# Patient Record
Sex: Female | Born: 1960 | Race: White | Hispanic: No | Marital: Single | Smoking: Former smoker
Health system: Southern US, Community
[De-identification: ages and names within clinical notes are randomized; demographics above are authoritative.]

## PROBLEM LIST (undated history)

## (undated) DIAGNOSIS — K219 Gastro-esophageal reflux disease without esophagitis: Secondary | ICD-10-CM

## (undated) DIAGNOSIS — E7800 Pure hypercholesterolemia, unspecified: Secondary | ICD-10-CM

## (undated) DIAGNOSIS — Z9889 Other specified postprocedural states: Secondary | ICD-10-CM

## (undated) DIAGNOSIS — T7840XA Allergy, unspecified, initial encounter: Secondary | ICD-10-CM

## (undated) DIAGNOSIS — F419 Anxiety disorder, unspecified: Secondary | ICD-10-CM

## (undated) DIAGNOSIS — N6019 Diffuse cystic mastopathy of unspecified breast: Secondary | ICD-10-CM

## (undated) DIAGNOSIS — R112 Nausea with vomiting, unspecified: Secondary | ICD-10-CM

## (undated) HISTORY — DX: Anxiety disorder, unspecified: F41.9

## (undated) HISTORY — DX: Allergy, unspecified, initial encounter: T78.40XA

## (undated) HISTORY — PX: OTHER SURGICAL HISTORY: SHX169

## (undated) HISTORY — DX: Pure hypercholesterolemia, unspecified: E78.00

## (undated) HISTORY — PX: TONSILLECTOMY: SUR1361

## (undated) HISTORY — PX: BREAST BIOPSY: SHX20

## (undated) HISTORY — DX: Diffuse cystic mastopathy of unspecified breast: N60.19

---

## 2001-03-18 HISTORY — PX: ABDOMINAL HYSTERECTOMY: SHX81

## 2004-05-28 ENCOUNTER — Emergency Department: Payer: Self-pay | Admitting: Emergency Medicine

## 2005-12-05 ENCOUNTER — Emergency Department: Payer: Self-pay

## 2006-01-14 DIAGNOSIS — E785 Hyperlipidemia, unspecified: Secondary | ICD-10-CM | POA: Insufficient documentation

## 2006-01-14 DIAGNOSIS — F411 Generalized anxiety disorder: Secondary | ICD-10-CM | POA: Insufficient documentation

## 2008-03-30 ENCOUNTER — Ambulatory Visit: Payer: Self-pay

## 2009-04-04 ENCOUNTER — Ambulatory Visit: Payer: Self-pay

## 2009-04-12 ENCOUNTER — Ambulatory Visit: Payer: Self-pay

## 2009-06-20 ENCOUNTER — Ambulatory Visit: Payer: Self-pay

## 2009-07-17 HISTORY — PX: BREAST BIOPSY: SHX20

## 2010-01-29 ENCOUNTER — Ambulatory Visit: Payer: Self-pay

## 2011-02-20 ENCOUNTER — Ambulatory Visit: Payer: Self-pay

## 2011-10-22 ENCOUNTER — Ambulatory Visit: Payer: Self-pay | Admitting: General Surgery

## 2012-04-10 ENCOUNTER — Ambulatory Visit: Payer: Self-pay | Admitting: Family Medicine

## 2012-04-11 ENCOUNTER — Ambulatory Visit: Payer: Self-pay | Admitting: Family Medicine

## 2012-04-11 ENCOUNTER — Other Ambulatory Visit: Payer: Self-pay | Admitting: Family Medicine

## 2012-04-11 LAB — CBC WITH DIFFERENTIAL/PLATELET
Basophil #: 0.1 10*3/uL (ref 0.0–0.1)
Basophil %: 1.2 %
Eosinophil #: 0.5 10*3/uL (ref 0.0–0.7)
Eosinophil %: 5.1 %
HCT: 37.4 % (ref 35.0–47.0)
HGB: 12.8 g/dL (ref 12.0–16.0)
Lymphocyte %: 33.4 %
MCH: 30.3 pg (ref 26.0–34.0)
Neutrophil #: 5 10*3/uL (ref 1.4–6.5)
RDW: 13.2 % (ref 11.5–14.5)

## 2012-04-11 LAB — COMPREHENSIVE METABOLIC PANEL
Albumin: 3.7 g/dL (ref 3.4–5.0)
Anion Gap: 6 — ABNORMAL LOW (ref 7–16)
BUN: 18 mg/dL (ref 7–18)
Bilirubin,Total: 0.3 mg/dL (ref 0.2–1.0)
Calcium, Total: 8.7 mg/dL (ref 8.5–10.1)
Co2: 25 mmol/L (ref 21–32)
Creatinine: 0.61 mg/dL (ref 0.60–1.30)
EGFR (African American): >60
SGOT(AST): 32 U/L (ref 15–37)
SGPT (ALT): 49 U/L (ref 12–78)
Sodium: 138 mmol/L (ref 136–145)

## 2012-05-27 ENCOUNTER — Encounter: Payer: Self-pay | Admitting: *Deleted

## 2012-06-02 ENCOUNTER — Ambulatory Visit: Payer: Self-pay | Admitting: General Surgery

## 2012-06-22 ENCOUNTER — Ambulatory Visit: Payer: Self-pay | Admitting: General Surgery

## 2012-07-15 ENCOUNTER — Encounter: Payer: Self-pay | Admitting: *Deleted

## 2012-10-27 ENCOUNTER — Ambulatory Visit: Payer: Self-pay | Admitting: Obstetrics and Gynecology

## 2012-12-02 ENCOUNTER — Encounter: Payer: Self-pay | Admitting: *Deleted

## 2012-12-23 ENCOUNTER — Ambulatory Visit: Payer: PRIVATE HEALTH INSURANCE | Admitting: General Surgery

## 2013-01-12 ENCOUNTER — Ambulatory Visit: Payer: Self-pay | Admitting: Orthopedic Surgery

## 2013-01-25 ENCOUNTER — Emergency Department: Payer: Self-pay | Admitting: Emergency Medicine

## 2013-02-02 ENCOUNTER — Encounter: Payer: Self-pay | Admitting: *Deleted

## 2013-11-01 ENCOUNTER — Ambulatory Visit: Payer: Self-pay | Admitting: General Surgery

## 2013-11-01 LAB — HM MAMMOGRAPHY

## 2013-11-02 ENCOUNTER — Encounter: Payer: Self-pay | Admitting: General Surgery

## 2014-01-17 ENCOUNTER — Encounter: Payer: Self-pay | Admitting: *Deleted

## 2014-08-23 ENCOUNTER — Telehealth: Payer: Self-pay | Admitting: Family Medicine

## 2014-08-23 DIAGNOSIS — K219 Gastro-esophageal reflux disease without esophagitis: Secondary | ICD-10-CM

## 2014-08-23 MED ORDER — LANSOPRAZOLE 30 MG PO CPDR: 30.0000 mg | DELAYED_RELEASE_CAPSULE | Freq: Every day | ORAL | Status: DC

## 2014-08-23 NOTE — Telephone Encounter (Signed)
Pt. Called requesting refill on Lansoprazole 30 mg.  Please call into [facility]. Pt call back # is [phone] cbe

## 2014-08-26 ENCOUNTER — Other Ambulatory Visit: Payer: Self-pay

## 2014-08-26 DIAGNOSIS — F419 Anxiety disorder, unspecified: Secondary | ICD-10-CM | POA: Insufficient documentation

## 2014-08-26 MED ORDER — CITALOPRAM HYDROBROMIDE 40 MG PO TABS: 40.0000 mg | ORAL_TABLET | Freq: Every day | ORAL | Status: DC

## 2014-09-05 ENCOUNTER — Other Ambulatory Visit: Payer: Self-pay | Admitting: Family Medicine

## 2014-09-05 MED ORDER — ALPRAZOLAM 0.5 MG PO TABS: ORAL_TABLET | ORAL | Status: DC

## 2014-09-05 NOTE — Telephone Encounter (Signed)
Dr. Elease Hashimoto patient.   She needs refills on her Xanax

## 2014-09-05 NOTE — Telephone Encounter (Signed)
Rx phoned into pharmacy.

## 2014-09-05 NOTE — Telephone Encounter (Signed)
Please call in the following medication.  Alprazolam 0.5mg  one tablet every eight hours as needed, #60, rf x 0.

## 2014-09-13 ENCOUNTER — Telehealth: Payer: Self-pay | Admitting: Family Medicine

## 2014-09-13 MED ORDER — HYDROCODONE-ACETAMINOPHEN 5-325 MG PO TABS: 1.0000 | ORAL_TABLET | Freq: Four times a day (QID) | ORAL | Status: DC | PRN

## 2014-09-13 NOTE — Telephone Encounter (Signed)
Patient needs written  Norco rx  5-325 mg.

## 2014-09-13 NOTE — Telephone Encounter (Signed)
LMTCB

## 2014-09-13 NOTE — Telephone Encounter (Signed)
Prescription printed. Please notify patient it is ready for pick up. Thanks- Dr. Braydin Aloi.  

## 2014-10-19 ENCOUNTER — Telehealth: Payer: Self-pay | Admitting: Family Medicine

## 2014-10-19 MED ORDER — HYDROCODONE-ACETAMINOPHEN 5-325 MG PO TABS: 1.0000 | ORAL_TABLET | Freq: Four times a day (QID) | ORAL | Status: DC | PRN

## 2014-10-19 NOTE — Telephone Encounter (Signed)
Printed rx for hydrocodone. Do not see we have given her Flagyl recently. What is it for.  Thanks.

## 2014-10-19 NOTE — Telephone Encounter (Signed)
Patient needs refill on Flaygl called to [facility] and also a written rx for the Norco.

## 2014-10-20 MED ORDER — METRONIDAZOLE 500 MG PO TABS: 500.0000 mg | ORAL_TABLET | Freq: Two times a day (BID) | ORAL | Status: DC

## 2014-10-20 NOTE — Telephone Encounter (Signed)
Pt advised.  She says she has bacterial vaginosis again and you prescribed that to her last year.  She says that cleared it up.    Thanks,   -Vernona Rieger

## 2014-11-02 ENCOUNTER — Ambulatory Visit
Admission: RE | Admit: 2014-11-02 | Discharge: 2014-11-02 | Disposition: A | Discharge status: Home / Self Care | Payer: Self-pay | Source: Ambulatory Visit | Attending: Oncology | Admitting: Oncology

## 2014-11-02 ENCOUNTER — Ambulatory Visit: Payer: Self-pay | Attending: Oncology | Admitting: *Deleted

## 2014-11-02 ENCOUNTER — Encounter: Payer: Self-pay | Admitting: *Deleted

## 2014-11-02 ENCOUNTER — Other Ambulatory Visit: Payer: Self-pay | Admitting: Oncology

## 2014-11-02 ENCOUNTER — Encounter (INDEPENDENT_AMBULATORY_CARE_PROVIDER_SITE_OTHER): Payer: Self-pay

## 2014-11-02 VITALS — BP 120/79 | HR 69 | Temp 97.2°F | Resp 17 | Ht 69.29 in | Wt 240.9 lb

## 2014-11-02 DIAGNOSIS — N644 Mastodynia: Secondary | ICD-10-CM | POA: Insufficient documentation

## 2014-11-02 DIAGNOSIS — N6001 Solitary cyst of right breast: Secondary | ICD-10-CM | POA: Insufficient documentation

## 2014-11-02 NOTE — Progress Notes (Signed)
Subjective:     Patient ID: Kristin Aguirre, female   DOB: 1960-06-28, 54 y.o.   MRN: [id number]  HPI   Review of Systems     Objective:   Physical Exam  Pulmonary/Chest: Right breast exhibits no inverted nipple, no mass, no nipple discharge, no skin change and no tenderness. Left breast exhibits tenderness. Left breast exhibits no inverted nipple, no mass, no nipple discharge and no skin change. Breasts are symmetrical.         Assessment:     54 year old White female presents to [facility] with complaints of left breast pain times one month.  Describes as intermittent, dull, and occasionally stabbing.  No aggravating or alleviating factors.  States its the same area she has had pain in the past when Dr. Lemar Livings aspirated a cyst. Patient does state she drinks about 2 soft drinks a day.   Family history includes a maternal grandmother with breast cancer.  Patient has been screened for eligibility.  She does not have any insurance, Medicare or Medicaid.  She also meets financial eligibility.  Hand-out given on the Affordable Care Act.    Plan:     Bilateral diagnostic mammogram and ultrasound ordered for targeted pain.  Will follow up per protocol.

## 2014-11-02 NOTE — Patient Instructions (Signed)
Gave patient hand-out, Women Staying Healthy, Active and Well from [facility], with education on breast health, pap smears, heart and colon health. 

## 2014-11-02 NOTE — Progress Notes (Signed)
Talked to patient this afternoon and review results of her mammogram.  Encouraged to call if her breast pain worsens or she feels a lump.  We can send her for further evaluation at that time.  She is agreeable. She is to follow up in one year. [facility]IS to [place].

## 2014-11-18 ENCOUNTER — Other Ambulatory Visit: Payer: Self-pay | Admitting: Family Medicine

## 2014-11-18 NOTE — Telephone Encounter (Signed)
Patient needs Estradiol and Xanax please.  [facility] [address]

## 2014-11-23 MED ORDER — ALPRAZOLAM 0.5 MG PO TABS: ORAL_TABLET | ORAL | Status: DC

## 2014-11-23 MED ORDER — ESTRADIOL 0.5 MG PO TABS: 0.5000 mg | ORAL_TABLET | Freq: Every day | ORAL | Status: DC

## 2014-11-23 NOTE — Telephone Encounter (Signed)
PLease call in rx for Xanax.  Thanks.

## 2014-11-23 NOTE — Telephone Encounter (Signed)
Patient actually called for this on Sept.2 and I forgot to route this to you.  Please do ASAP.  Thanks, Okey Regal

## 2014-11-23 NOTE — Addendum Note (Signed)
Addended by: Leo Grosser on: 11/23/2014 09:57 AM   Modules accepted: Orders

## 2014-11-23 NOTE — Telephone Encounter (Signed)
RX called in.   Thanks,   -Zephyr Sausedo  

## 2014-11-29 ENCOUNTER — Other Ambulatory Visit: Payer: Self-pay | Admitting: Family Medicine

## 2014-11-29 DIAGNOSIS — M545 Low back pain, unspecified: Secondary | ICD-10-CM | POA: Insufficient documentation

## 2014-11-29 MED ORDER — HYDROCODONE-ACETAMINOPHEN 5-325 MG PO TABS: 1.0000 | ORAL_TABLET | Freq: Four times a day (QID) | ORAL | Status: DC | PRN

## 2014-11-29 NOTE — Telephone Encounter (Signed)
Patient needs written rx for Norco.  Call when ready.

## 2014-11-29 NOTE — Telephone Encounter (Signed)
Prescription printed. Please notify patient it is ready for pick up. Thanks- Dr. Aidee Latimore.  

## 2014-11-29 NOTE — Telephone Encounter (Signed)
Left message advising her RX is up at the front desk.   Thanks,   -Vernona Rieger

## 2015-01-11 ENCOUNTER — Other Ambulatory Visit: Payer: Self-pay

## 2015-01-11 DIAGNOSIS — M545 Low back pain: Secondary | ICD-10-CM

## 2015-01-11 MED ORDER — HYDROCODONE-ACETAMINOPHEN 5-325 MG PO TABS: 1.0000 | ORAL_TABLET | Freq: Four times a day (QID) | ORAL | Status: DC | PRN

## 2015-01-11 NOTE — Telephone Encounter (Signed)
Printed, please fax or call in to pharmacy. Thank you.   

## 2015-01-11 NOTE — Telephone Encounter (Signed)
Pt advised to pick up prescription. sd

## 2015-01-11 NOTE — Telephone Encounter (Signed)
Pt called requesting a refill-aa

## 2015-01-16 ENCOUNTER — Other Ambulatory Visit: Payer: Self-pay | Admitting: Family Medicine

## 2015-01-16 DIAGNOSIS — F419 Anxiety disorder, unspecified: Secondary | ICD-10-CM

## 2015-01-16 MED ORDER — ALPRAZOLAM 0.5 MG PO TABS: ORAL_TABLET | ORAL | Status: DC

## 2015-01-16 NOTE — Telephone Encounter (Signed)
Pt needs refill ALPRAZolam Prudy Feeler) 0.5 MG tablet   [employer] back [phone]  [employer]

## 2015-01-16 NOTE — Telephone Encounter (Signed)
Printed, please fax or call in to pharmacy. Thank you.   

## 2015-01-17 ENCOUNTER — Other Ambulatory Visit: Payer: Self-pay | Admitting: Family Medicine

## 2015-01-17 NOTE — Telephone Encounter (Signed)
RX called in.   Thanks,   -Dory Demont  

## 2015-01-17 NOTE — Telephone Encounter (Signed)
Pt states [facility] [address] has not rec'd her Rx for ALPRAZolam (XANAX) 0.5 MG tablet.  Pt is requesting this resent/MW

## 2015-01-26 ENCOUNTER — Telehealth: Payer: Self-pay | Admitting: Family Medicine

## 2015-01-26 NOTE — Telephone Encounter (Signed)
Pt needs refill of Atorvastatin 10 mg.  Called to [facility] in [place].

## 2015-01-26 NOTE — Telephone Encounter (Signed)
Atorvastatin or Pravastatin. Thanks.

## 2015-01-27 NOTE — Telephone Encounter (Signed)
LMTCB Phyillis Dascoli Drozdowski, CMA  

## 2015-01-31 DIAGNOSIS — G4700 Insomnia, unspecified: Secondary | ICD-10-CM | POA: Insufficient documentation

## 2015-01-31 DIAGNOSIS — F410 Panic disorder [episodic paroxysmal anxiety] without agoraphobia: Secondary | ICD-10-CM | POA: Insufficient documentation

## 2015-01-31 DIAGNOSIS — J309 Allergic rhinitis, unspecified: Secondary | ICD-10-CM | POA: Insufficient documentation

## 2015-01-31 DIAGNOSIS — E8940 Asymptomatic postprocedural ovarian failure: Secondary | ICD-10-CM | POA: Insufficient documentation

## 2015-01-31 DIAGNOSIS — Z720 Tobacco use: Secondary | ICD-10-CM | POA: Insufficient documentation

## 2015-01-31 DIAGNOSIS — M7120 Synovial cyst of popliteal space [Baker], unspecified knee: Secondary | ICD-10-CM | POA: Insufficient documentation

## 2015-01-31 DIAGNOSIS — R51 Headache: Secondary | ICD-10-CM

## 2015-01-31 DIAGNOSIS — R0602 Shortness of breath: Secondary | ICD-10-CM | POA: Insufficient documentation

## 2015-01-31 DIAGNOSIS — J45909 Unspecified asthma, uncomplicated: Secondary | ICD-10-CM | POA: Insufficient documentation

## 2015-01-31 DIAGNOSIS — E559 Vitamin D deficiency, unspecified: Secondary | ICD-10-CM | POA: Insufficient documentation

## 2015-01-31 DIAGNOSIS — K219 Gastro-esophageal reflux disease without esophagitis: Secondary | ICD-10-CM | POA: Insufficient documentation

## 2015-01-31 DIAGNOSIS — Z202 Contact with and (suspected) exposure to infections with a predominantly sexual mode of transmission: Secondary | ICD-10-CM | POA: Insufficient documentation

## 2015-01-31 DIAGNOSIS — R0789 Other chest pain: Secondary | ICD-10-CM | POA: Insufficient documentation

## 2015-01-31 DIAGNOSIS — R519 Headache, unspecified: Secondary | ICD-10-CM | POA: Insufficient documentation

## 2015-02-01 ENCOUNTER — Ambulatory Visit (INDEPENDENT_AMBULATORY_CARE_PROVIDER_SITE_OTHER): Payer: Self-pay | Admitting: Family Medicine

## 2015-02-01 ENCOUNTER — Encounter: Payer: Self-pay | Admitting: Family Medicine

## 2015-02-01 VITALS — BP 108/64 | HR 84 | Temp 98.3°F | Resp 16 | Ht 69.0 in | Wt 236.0 lb

## 2015-02-01 DIAGNOSIS — F419 Anxiety disorder, unspecified: Secondary | ICD-10-CM

## 2015-02-01 DIAGNOSIS — N958 Other specified menopausal and perimenopausal disorders: Secondary | ICD-10-CM

## 2015-02-01 DIAGNOSIS — E785 Hyperlipidemia, unspecified: Secondary | ICD-10-CM

## 2015-02-01 DIAGNOSIS — M545 Low back pain: Secondary | ICD-10-CM

## 2015-02-01 DIAGNOSIS — E8940 Asymptomatic postprocedural ovarian failure: Secondary | ICD-10-CM

## 2015-02-01 MED ORDER — CITALOPRAM HYDROBROMIDE 40 MG PO TABS: 40.0000 mg | ORAL_TABLET | Freq: Every day | ORAL | Status: DC

## 2015-02-01 MED ORDER — ESTRADIOL 0.5 MG PO TABS: 0.5000 mg | ORAL_TABLET | Freq: Every day | ORAL | Status: DC

## 2015-02-01 MED ORDER — HYDROCODONE-ACETAMINOPHEN 5-325 MG PO TABS: 1.0000 | ORAL_TABLET | Freq: Four times a day (QID) | ORAL | Status: DC | PRN

## 2015-02-01 MED ORDER — PRAVASTATIN SODIUM 40 MG PO TABS: 40.0000 mg | ORAL_TABLET | Freq: Every day | ORAL | Status: DC

## 2015-02-01 MED ORDER — ALPRAZOLAM 0.5 MG PO TABS: ORAL_TABLET | ORAL | Status: DC

## 2015-02-01 NOTE — Progress Notes (Signed)
Subjective:    Patient ID: Kristin Aguirre, female    DOB: 01-15-1961, 54 y.o.   MRN: [id number]  Hyperlipidemia This is a chronic problem. Lipid results: 01/07/2013- Total- 201; Trig- 253; HDL- 47; LDL- 103. Pertinent negatives include no chest pain, focal sensory loss, focal weakness, leg pain, myalgias or shortness of breath. Current antihyperlipidemic treatment includes statins and diet change (Pravastatin 40 mg). There are no compliance problems.  Risk factors for coronary artery disease include obesity, post-menopausal, dyslipidemia and family history.  Anxiety Presents for follow-up visit. Patient reports no chest pain, compulsions, confusion, decreased concentration, depressed mood, dizziness, dry mouth, excessive worry, feeling of choking, hyperventilation, insomnia, irritability, malaise, muscle tension, nausea, nervous/anxious behavior, obsessions, palpitations, panic, restlessness, shortness of breath or suicidal ideas. The quality of sleep is good.   (Celexa 40 mg po qd, and Xanax 0.5 mg BID prn)   Has lost over 20 pounds.  Has cut back on her portions.  Lost job.     Review of Systems  Constitutional: Negative for irritability.  Respiratory: Negative for cough, shortness of breath and wheezing.   Cardiovascular: Negative for chest pain, palpitations and leg swelling.  Gastrointestinal: Negative for nausea.  Musculoskeletal: Positive for arthralgias (knees). Negative for myalgias.  Neurological: Negative for dizziness and focal weakness.  Psychiatric/Behavioral: Negative for suicidal ideas, confusion and decreased concentration. The patient is not nervous/anxious and does not have insomnia.    BP 108/64 mmHg  Pulse 84  Temp(Src) 98.3 F (36.8 C) (Oral)  Resp 16  Ht 5\' 9"  (1.753 m)  Wt 236 lb (107.049 kg)  BMI 34.84 kg/m2  LMP 03/18/2001 (Approximate)   Patient Active Problem List   Diagnosis Date Noted  . Allergic rhinitis 01/31/2015  . Airway hyperreactivity  01/31/2015  . Atypical chest pain 01/31/2015  . Baker's cyst of knee 01/31/2015  . Chest pressure 01/31/2015  . Chronic headache 01/31/2015  . Tobacco use 01/31/2015  . Contact with and suspected exposure to infections with predominantly sexual mode of transmission 01/31/2015  . Acid reflux 01/31/2015  . Cannot sleep 01/31/2015  . Panic attack 01/31/2015  . Postsurgical menopause 01/31/2015  . Breath shortness 01/31/2015  . Avitaminosis D 01/31/2015  . Low back pain 11/29/2014  . Anxiety 08/26/2014  . Anxiety, generalized 01/14/2006  . Hypercholesteremia 01/14/2006   Past Medical History  Diagnosis Date  . Anxiety   . High cholesterol   . Diffuse cystic mastopathy   . Allergy    Current Outpatient Prescriptions on File Prior to Visit  Medication Sig  . ALPRAZolam (XANAX) 0.5 MG tablet Two times daily as needed.  . citalopram (CELEXA) 40 MG tablet Take 1 tablet (40 mg total) by mouth daily.  01/16/2006 estradiol (ESTRACE) 0.5 MG tablet Take 1 tablet (0.5 mg total) by mouth daily.  Marland Kitchen HYDROcodone-acetaminophen (NORCO) 5-325 MG tablet Take 1 tablet by mouth every 6 (six) hours as needed for moderate pain.  Marland Kitchen lansoprazole (PREVACID) 30 MG capsule Take 1 capsule (30 mg total) by mouth daily at 12 noon.  . pravastatin (PRAVACHOL) 40 MG tablet Take 40 mg by mouth daily.  . Vitamin D, Cholecalciferol, 1000 UNITS TABS Take by mouth.  . Ascorbic Acid (VITAMIN C PO) Take by mouth.  . montelukast (SINGULAIR) 10 MG tablet Take by mouth.   No current facility-administered medications on file prior to visit.   No Known Allergies Past Surgical History  Procedure Laterality Date  . Abdominal hysterectomy  2003  . Tonsillectomy    .  Cervical spine repair     . Breast biopsy Left 2011    neg   Social History   Social History  . Marital Status: Divorced    Spouse Name: N/A  . Number of Children: N/A  . Years of Education: N/A   Occupational History  . Not on file.   Social History Main  Topics  . Smoking status: Current Every Day Smoker -- 0.00 packs/day    Start date: 04/03/2012  . Smokeless tobacco: Never Used     Comment: SMokes 2 cigarettes daily  . Alcohol Use: 0.0 oz/week    0 Standard drinks or equivalent per week     Comment: occasional  . Drug Use: No  . Sexual Activity: Not on file   Other Topics Concern  . Not on file   Social History Narrative   Family History  Problem Relation Age of Onset  . Breast cancer Maternal Grandmother   . Heart block Mother   . Cancer Mother   . Diabetes Mother   . Heart disease Mother   . Diabetes Father   . Heart disease Father   . CVA Father   . Asthma Sister   . Anemia Sister   . Seizures Brother        Objective:   Physical Exam  Constitutional: She is oriented to person, place, and time. She appears well-developed and well-nourished.  Cardiovascular: Normal rate and regular rhythm.   Pulmonary/Chest: Effort normal and breath sounds normal.  Neurological: She is alert and oriented to person, place, and time.  Psychiatric: She has a normal mood and affect. Her behavior is normal. Judgment and thought content normal.    BP 108/64 mmHg  Pulse 84  Temp(Src) 98.3 F (36.8 C) (Oral)  Resp 16  Ht 5\' 9"  (1.753 m)  Wt 236 lb (107.049 kg)  BMI 34.84 kg/m2  LMP 03/18/2001 (Approximate)     Assessment & Plan:  1. Hyperlipidemia Stable. Has been off meds for past week. Will restart and check labs in about one to two weeks.  - Lipid panel - Comprehensive metabolic panel - pravastatin (PRAVACHOL) 40 MG tablet; Take 1 tablet (40 mg total) by mouth daily.  Dispense: 30 tablet; Refill: 5  2. Low back pain without sciatica, unspecified back pain laterality Refilled medication.  - HYDROcodone-acetaminophen (NORCO) 5-325 MG tablet; Take 1 tablet by mouth every 6 (six) hours as needed for moderate pain.  Dispense: 120 tablet; Refill: 0  3. Postsurgical menopause Stable. Continue medication.   - estradiol (ESTRACE)  0.5 MG tablet; Take 1 tablet (0.5 mg total) by mouth daily.  Dispense: 30 tablet; Refill: 5  4. Anxiety Stable. Continue medication.  - citalopram (CELEXA) 40 MG tablet; Take 1 tablet (40 mg total) by mouth daily.  Dispense: 90 tablet; Refill: 3 - ALPRAZolam (XANAX) 0.5 MG tablet; Two times daily as needed.  Dispense: 60 tablet; Refill: 5  05/16/2001, MD

## 2015-02-03 ENCOUNTER — Telehealth: Payer: Self-pay | Admitting: Family Medicine

## 2015-02-03 MED ORDER — ATORVASTATIN CALCIUM 10 MG PO TABS: 10.0000 mg | ORAL_TABLET | Freq: Every day | ORAL | Status: DC

## 2015-02-03 NOTE — Telephone Encounter (Signed)
Kristin Aguirre states she was suppose to get Atorvastatin  10 mg. Instead of Pravachol.  Please fix and call into [facility] in [place].

## 2015-02-06 ENCOUNTER — Telehealth: Payer: Self-pay | Admitting: Family Medicine

## 2015-02-06 NOTE — Telephone Encounter (Signed)
Trial of OTC medication.  Is viral. Call if worsens or does not improve.

## 2015-02-06 NOTE — Telephone Encounter (Signed)
Pt stated that she was in for OV on 02/01/15 and the next day she felt awful. Pt stated she has cough, congestion, & fatigue. I explained that we can not treat over the phone but pt wanted to asked Dr. Elease Hashimoto before scheduling an OV. Pt stated she doesn't feel like coming in the office. Pt would like something sent in but she would have to know what medication so she can look up which pharmacy has it cheaper since she doesn't have insurance. Please advise. Thanks TNP

## 2015-03-24 ENCOUNTER — Other Ambulatory Visit: Payer: Self-pay | Admitting: Family Medicine

## 2015-03-24 DIAGNOSIS — M545 Low back pain: Secondary | ICD-10-CM

## 2015-03-24 MED ORDER — NORCO 5-325 MG PO TABS: 1.0000 | ORAL_TABLET | Freq: Four times a day (QID) | ORAL | Status: DC | PRN

## 2015-03-24 MED ORDER — HYDROCODONE-ACETAMINOPHEN 5-325 MG PO TABS: 1.0000 | ORAL_TABLET | Freq: Four times a day (QID) | ORAL | Status: DC | PRN

## 2015-03-24 NOTE — Telephone Encounter (Signed)
Pt needs refill of Norco before the "storm" hits.

## 2015-03-24 NOTE — Telephone Encounter (Signed)
Prescription printed. Please notify patient it is ready for pick up. Thanks- Dr. Witten Certain.  

## 2015-03-24 NOTE — Telephone Encounter (Signed)
Prescription printed. Please notify patient it is ready for pick up. Thanks- Dr. Merland Holness.  

## 2015-04-14 ENCOUNTER — Telehealth: Payer: Self-pay | Admitting: Family Medicine

## 2015-04-14 DIAGNOSIS — E8940 Asymptomatic postprocedural ovarian failure: Secondary | ICD-10-CM

## 2015-04-14 MED ORDER — ESTRADIOL 0.5 MG PO TABS: 0.5000 mg | ORAL_TABLET | Freq: Every day | ORAL | Status: DC

## 2015-04-14 NOTE — Telephone Encounter (Signed)
Pt needs refill of Estrace called to [facility] [address].

## 2015-05-01 ENCOUNTER — Telehealth: Payer: Self-pay | Admitting: Family Medicine

## 2015-05-01 DIAGNOSIS — M545 Low back pain: Secondary | ICD-10-CM

## 2015-05-01 MED ORDER — NORCO 5-325 MG PO TABS
1.0000 | ORAL_TABLET | Freq: Four times a day (QID) | ORAL | Status: DC | PRN
Start: 2015-05-01 — End: 2015-06-01

## 2015-05-01 NOTE — Telephone Encounter (Signed)
Prescription printed. Please notify patient it is ready for pick up. Thanks- Dr. Fantashia Shupert.  

## 2015-05-01 NOTE — Telephone Encounter (Signed)
Patient needs written rx for Norco please.

## 2015-06-01 ENCOUNTER — Other Ambulatory Visit: Payer: Self-pay | Admitting: Family Medicine

## 2015-06-01 DIAGNOSIS — M545 Low back pain: Secondary | ICD-10-CM

## 2015-06-01 MED ORDER — NORCO 5-325 MG PO TABS: 1.0000 | ORAL_TABLET | Freq: Four times a day (QID) | ORAL | Status: DC | PRN

## 2015-06-01 NOTE — Telephone Encounter (Signed)
Informed pt. Kristin Aguirre, CMA  

## 2015-06-01 NOTE — Telephone Encounter (Signed)
Prescription printed. Please notify patient it is ready for pick up. Thanks- Dr. Chistian Kasler.  

## 2015-06-01 NOTE — Telephone Encounter (Signed)
Patient needs her rx for Norco please

## 2015-07-03 ENCOUNTER — Other Ambulatory Visit: Payer: Self-pay | Admitting: Family Medicine

## 2015-07-03 DIAGNOSIS — M545 Low back pain: Secondary | ICD-10-CM

## 2015-07-03 NOTE — Telephone Encounter (Signed)
Patient needs refill on Norco.  Call when ready.

## 2015-07-04 MED ORDER — NORCO 5-325 MG PO TABS: 1.0000 | ORAL_TABLET | Freq: Four times a day (QID) | ORAL | Status: DC | PRN

## 2015-07-04 NOTE — Telephone Encounter (Signed)
Med last filled on 05/28/2015 #120 0RF. Patient's last OV was 02/01/2015.

## 2015-07-04 NOTE — Telephone Encounter (Signed)
Printed, please fax or call in to pharmacy. Thank you.   

## 2015-07-27 ENCOUNTER — Telehealth: Payer: Self-pay | Admitting: *Deleted

## 2015-08-04 ENCOUNTER — Telehealth: Payer: Self-pay | Admitting: Family Medicine

## 2015-08-04 DIAGNOSIS — M545 Low back pain: Secondary | ICD-10-CM

## 2015-08-04 MED ORDER — NORCO 5-325 MG PO TABS: 1.0000 | ORAL_TABLET | Freq: Four times a day (QID) | ORAL | Status: DC | PRN

## 2015-08-04 NOTE — Telephone Encounter (Signed)
Needs refill of norco 5-325 mg.  Call when ready

## 2015-08-04 NOTE — Telephone Encounter (Signed)
Prescription printed. Please notify patient it is ready for pick up. Thanks- Dr. Shaman Muscarella.  

## 2015-08-08 ENCOUNTER — Encounter: Payer: Self-pay | Admitting: General Surgery

## 2015-08-09 ENCOUNTER — Encounter: Payer: Self-pay | Admitting: General Surgery

## 2015-08-09 ENCOUNTER — Ambulatory Visit (INDEPENDENT_AMBULATORY_CARE_PROVIDER_SITE_OTHER): Payer: PRIVATE HEALTH INSURANCE | Admitting: General Surgery

## 2015-08-09 VITALS — BP 138/70 | HR 80 | Resp 13 | Ht 69.5 in | Wt 244.0 lb

## 2015-08-09 DIAGNOSIS — R0789 Other chest pain: Secondary | ICD-10-CM | POA: Diagnosis not present

## 2015-08-09 NOTE — Progress Notes (Signed)
Patient ID: Kristin Aguirre, female   DOB: 02-24-1961, 55 y.o.   MRN: [id number]  Chief Complaint  Patient presents with  . Other    left breast pain    HPI Kristin Aguirre is a 55 y.o. female here for assessment of left breast pain. Her last mammogram was on 11/02/14. She has had this pain for several years and it went away. She reports that it returned about a month ago. She reports she can feel something there in the breast and then the pain will come and go. The knot is about the size of a "marble." The pain in on the outer portion of the breast and can travel towards the back. She describes the pain as stabbing/shooting at times. Denies shortness of breathe. She does admit to generalized fatigue.  She goes to the tanning bed 3 x week.  The patient was last evaluated in December 2012 at which time she presented with a several month history of fullness and discomfort in the lateral aspect of the left breast near where should undergone a biopsy in May 2011.  She is here today with her friend Kristin Dandy Aguirre.  I personally reviewed the patient's history.  HPI    Past Medical History  Diagnosis Date  . Anxiety   . High cholesterol   . Diffuse cystic mastopathy   . Allergy     Past Surgical History  Procedure Laterality Date  . Abdominal hysterectomy  2003  . Tonsillectomy    . Cervical spine repair     . Breast biopsy Left 2011, 2015    neg  . Breast biopsy Left 07/17/2009    Vacuum biopsy, 4:00 position: 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    Family History  Problem Relation Age of Onset  . Breast cancer Maternal Grandmother   . Heart block Mother   . Cancer Mother   . Diabetes Mother   . Heart disease Mother   . Diabetes Father   . Heart disease Father   . CVA Father   . Asthma Sister   . Anemia Sister   . Seizures Brother     Social History Social History  Substance  Use Topics  . Smoking status: Current Every Day Smoker -- 0.00 packs/day    Start date: 04/03/2012  . Smokeless tobacco: Never Used     Comment: SMokes 2 cigarettes daily  . Alcohol Use: 0.0 oz/week    0 Standard drinks or equivalent per week     Comment: occasional    No Known Allergies  Current Outpatient Prescriptions  Medication Sig Dispense Refill  . Ascorbic Acid (VITAMIN C PO) Take by mouth.    . citalopram (CELEXA) 40 MG tablet Take 1 tablet (40 mg total) by mouth daily. 90 tablet 3  . lansoprazole (PREVACID) 30 MG capsule Take 1 capsule (30 mg total) by mouth daily at 12 noon. 30 capsule 5  . NORCO 5-325 MG tablet Take 1 tablet by mouth every 6 (six) hours as needed for moderate pain. 120 tablet 0  . pravastatin (PRAVACHOL) 10 MG tablet Take 10 mg by mouth daily.    . Vitamin D, Cholecalciferol, 1000 UNITS TABS Take by mouth.     No current facility-administered medications for this visit.    Review of Systems Review of Systems  Constitutional: Negative.   Respiratory: Negative.   Cardiovascular: Negative.     Blood pressure 138/70, pulse 80, resp. rate 13, height  5' 9.5" (1.765 m), weight 244 lb (110.678 kg), last menstrual period 03/18/2001.  Physical Exam Physical Exam  Constitutional: She is oriented to person, place, and time. She appears well-developed and well-nourished.  HENT:  Mouth/Throat: Oropharynx is clear and moist.  Eyes: Conjunctivae are normal. No scleral icterus.  Neck: Neck supple.  Cardiovascular: Normal rate, regular rhythm and normal heart sounds.   Pulmonary/Chest: Effort normal and breath sounds normal. Right breast exhibits no inverted nipple, no mass, no nipple discharge, no skin change and no tenderness. Left breast exhibits no inverted nipple, no mass, no nipple discharge, no skin change and no tenderness.    Right breast > left breast. Tender left lateral serratus.   Lymphadenopathy:    She has no cervical adenopathy.    She has no  axillary adenopathy.  Neurological: She is alert and oriented to person, place, and time.  Skin: Skin is warm and dry.  Psychiatric: Her behavior is normal.    Data Reviewed 2011 biopsy results reviewed.  Mammograms and ultrasound from 11/02/2014 were reviewed. BI-RADS-2.  Assessment    Chest wall pain unrelated to the breast parenchyma.    Plan    Conservative measures indicated.    Recommend anti-inflammatory 2 Aleve BID for 7 days then reduce dosing and heating pad for comfort as needed.  The patient is aware to call back for any questions or concerns.     PCP: Kristin Aguirre This information has been scribed by Kristin Daft RN, BSN,BC.   Kristin Aguirre 08/10/2015, 10:20 AM

## 2015-08-09 NOTE — Patient Instructions (Addendum)
anti-inflammatory 2 Aleve twice a day for 7 days then reduce dosing and heating pad for comfort as needed. The patient is aware to call back for any questions or concerns.

## 2015-08-10 ENCOUNTER — Encounter: Payer: Self-pay | Admitting: General Surgery

## 2015-08-10 DIAGNOSIS — R0789 Other chest pain: Secondary | ICD-10-CM | POA: Insufficient documentation

## 2015-08-31 ENCOUNTER — Other Ambulatory Visit: Payer: Self-pay | Admitting: Family Medicine

## 2015-08-31 DIAGNOSIS — M545 Low back pain: Secondary | ICD-10-CM

## 2015-08-31 MED ORDER — NORCO 5-325 MG PO TABS: 1.0000 | ORAL_TABLET | Freq: Four times a day (QID) | ORAL | Status: DC | PRN

## 2015-08-31 NOTE — Telephone Encounter (Signed)
Last refill was 08/04/2015 x 1 month. Allene Dillon, CMA

## 2015-08-31 NOTE — Telephone Encounter (Signed)
Pt needs refill on her NORCO 5-325 MG tablet  Please call pt when ready [phone]   Thank sTeri

## 2015-08-31 NOTE — Telephone Encounter (Signed)
Prescription printed. Please notify patient it is ready for pick up. Thanks- Dr. Knoxx Boeding.  

## 2015-09-29 ENCOUNTER — Telehealth: Payer: Self-pay | Admitting: Physician Assistant

## 2015-09-29 ENCOUNTER — Other Ambulatory Visit: Payer: Self-pay

## 2015-09-29 DIAGNOSIS — M545 Low back pain: Secondary | ICD-10-CM

## 2015-09-29 NOTE — Telephone Encounter (Signed)
Will fill Monday when I return. If she is needing now will need to be filled by another provider.

## 2015-09-29 NOTE — Telephone Encounter (Signed)
Patient use to see Dr. Elease Hashimoto. She is needing a refill on Norco please.

## 2015-09-29 NOTE — Telephone Encounter (Signed)
Patient is requesting refill on the following medication:NORCO 5-325 MG tablet.

## 2015-10-02 MED ORDER — NORCO 5-325 MG PO TABS: 1.0000 | ORAL_TABLET | Freq: Four times a day (QID) | ORAL | Status: DC | PRN

## 2015-10-09 ENCOUNTER — Encounter: Payer: Self-pay | Admitting: Physician Assistant

## 2015-10-09 ENCOUNTER — Ambulatory Visit (INDEPENDENT_AMBULATORY_CARE_PROVIDER_SITE_OTHER): Payer: 59 | Admitting: Physician Assistant

## 2015-10-09 VITALS — BP 124/68 | HR 84 | Temp 98.0°F | Resp 16 | Wt 247.0 lb

## 2015-10-09 DIAGNOSIS — R600 Localized edema: Secondary | ICD-10-CM

## 2015-10-09 DIAGNOSIS — Z136 Encounter for screening for cardiovascular disorders: Secondary | ICD-10-CM

## 2015-10-09 DIAGNOSIS — F419 Anxiety disorder, unspecified: Secondary | ICD-10-CM

## 2015-10-09 DIAGNOSIS — Z720 Tobacco use: Secondary | ICD-10-CM | POA: Diagnosis not present

## 2015-10-09 DIAGNOSIS — Z13220 Encounter for screening for lipoid disorders: Secondary | ICD-10-CM

## 2015-10-09 DIAGNOSIS — Z716 Tobacco abuse counseling: Secondary | ICD-10-CM | POA: Diagnosis not present

## 2015-10-09 MED ORDER — NICOTINE 7 MG/24HR TD PT24: 7.0000 mg | MEDICATED_PATCH | Freq: Every day | TRANSDERMAL | 0 refills | Status: DC

## 2015-10-09 MED ORDER — FUROSEMIDE 20 MG PO TABS: 20.0000 mg | ORAL_TABLET | Freq: Every day | ORAL | 1 refills | Status: DC

## 2015-10-09 MED ORDER — NICOTINE 14 MG/24HR TD PT24: 14.0000 mg | MEDICATED_PATCH | TRANSDERMAL | 0 refills | Status: DC

## 2015-10-09 MED ORDER — ALPRAZOLAM 0.5 MG PO TABS: 0.5000 mg | ORAL_TABLET | Freq: Two times a day (BID) | ORAL | 0 refills | Status: DC | PRN

## 2015-10-09 NOTE — Patient Instructions (Signed)
Edema °Edema is an abnormal buildup of fluids in your body tissues. Edema is somewhat dependent on gravity to pull the fluid to the lowest place in your body. That makes the condition more common in the legs and thighs (lower extremities). Painless swelling of the feet and ankles is common and becomes more likely as you get older. It is also common in looser tissues, like around your eyes.  °When the affected area is squeezed, the fluid may move out of that spot and leave a dent for a few moments. This dent is called pitting.  °CAUSES  °There are many possible causes of edema. Eating too much salt and being on your feet or sitting for a long time can cause edema in your legs and ankles. Hot weather may make edema worse. Common medical causes of edema include: °· Heart failure. °· Liver disease. °· Kidney disease. °· Weak blood vessels in your legs. °· Cancer. °· An injury. °· Pregnancy. °· Some medications. °· Obesity.  °SYMPTOMS  °Edema is usually painless. Your skin may look swollen or shiny.  °DIAGNOSIS  °Your health care provider may be able to diagnose edema by asking about your medical history and doing a physical exam. You may need to have tests such as X-rays, an electrocardiogram, or blood tests to check for medical conditions that may cause edema.  °TREATMENT  °Edema treatment depends on the cause. If you have heart, liver, or kidney disease, you need the treatment appropriate for these conditions. General treatment may include: °· Elevation of the affected body part above the level of your heart. °· Compression of the affected body part. Pressure from elastic bandages or support stockings squeezes the tissues and forces fluid back into the blood vessels. This keeps fluid from entering the tissues. °· Restriction of fluid and salt intake. °· Use of a water pill (diuretic). These medications are appropriate only for some types of edema. They pull fluid out of your body and make you urinate more often. This  gets rid of fluid and reduces swelling, but diuretics can have side effects. Only use diuretics as directed by your health care provider. °HOME CARE INSTRUCTIONS  °· Keep the affected body part above the level of your heart when you are lying down.   °· Do not sit still or stand for prolonged periods.   °· Do not put anything directly under your knees when lying down. °· Do not wear constricting clothing or garters on your upper legs.   °· Exercise your legs to work the fluid back into your blood vessels. This may help the swelling go down.   °· Wear elastic bandages or support stockings to reduce ankle swelling as directed by your health care provider.   °· Eat a low-salt diet to reduce fluid if your health care provider recommends it.   °· Only take medicines as directed by your health care provider.  °SEEK MEDICAL CARE IF:  °· Your edema is not responding to treatment. °· You have heart, liver, or kidney disease and notice symptoms of edema. °· You have edema in your legs that does not improve after elevating them.   °· You have sudden and unexplained weight gain. °SEEK IMMEDIATE MEDICAL CARE IF:  °· You develop shortness of breath or chest pain.   °· You cannot breathe when you lie down. °· You develop pain, redness, or warmth in the swollen areas.   °· You have heart, liver, or kidney disease and suddenly get edema. °· You have a fever and your symptoms suddenly get worse. °MAKE SURE YOU:  °·   Understand these instructions. °· Will watch your condition. °· Will get help right away if you are not doing well or get worse. °  °This information is not intended to replace advice given to you by your health care provider. Make sure you discuss any questions you have with your health care provider. °  °Document Released: 03/04/2005 Document Revised: 03/25/2014 Document Reviewed: 12/25/2012 °Elsevier Interactive Patient Education ©2016 [employer] ° °

## 2015-10-09 NOTE — Progress Notes (Signed)
Patient: Kristin Aguirre Female    DOB: 1960/03/23   55 y.o.   MRN: [id number] Visit Date: 10/09/2015  Today's Provider: Margaretann Loveless, PA-C   Chief Complaint  Patient presents with  . Edema    bilateral. Left worse than right   Subjective:    HPI Pt is here for edema in her legs, ankles and feet. She reports that the left leg is worse than the right leg. She has noticed this for about 3 weeks. She says that the swelling can be periodically during the day, not worse at night. Denies shortness of breath or chest pain. She denies varicose veins.     No Known Allergies Current Meds  Medication Sig  . Ascorbic Acid (VITAMIN C PO) Take by mouth.  . citalopram (CELEXA) 40 MG tablet Take 1 tablet (40 mg total) by mouth daily.  . lansoprazole (PREVACID) 30 MG capsule Take 1 capsule (30 mg total) by mouth daily at 12 noon.  . NORCO 5-325 MG tablet Take 1 tablet by mouth every 6 (six) hours as needed for moderate pain.  . pravastatin (PRAVACHOL) 10 MG tablet Take 10 mg by mouth daily.  . Vitamin D, Cholecalciferol, 1000 UNITS TABS Take by mouth.    Review of Systems  Constitutional: Negative.   HENT: Negative.   Eyes: Negative.   Respiratory: Negative.   Cardiovascular: Positive for leg swelling.  Gastrointestinal: Negative.   Endocrine: Negative.   Genitourinary: Negative.   Musculoskeletal: Negative.   Allergic/Immunologic: Negative.   Neurological: Negative.   Hematological: Negative.   Psychiatric/Behavioral: Negative.     Social History  Substance Use Topics  . Smoking status: Current Every Day Smoker    Packs/day: 0.25    Start date: 04/03/2012  . Smokeless tobacco: Never Used     Comment: SMokes 10 cigarettes daily  . Alcohol use 0.0 oz/week     Comment: occasional   Objective:   BP 124/68 (BP Location: Left Arm, Patient Position: Sitting, Cuff Size: Large)   Pulse 84   Temp 98 F (36.7 C) (Oral)   Resp 16   Wt 247 lb (112 kg)   LMP  03/18/2001 (Approximate)   BMI 35.95 kg/m   Physical Exam  Constitutional: She appears well-developed and well-nourished. No distress.  Neck: Normal range of motion. Neck supple. No JVD present. No tracheal deviation present. No thyromegaly present.  Cardiovascular: Normal rate, regular rhythm and normal heart sounds.  Exam reveals no gallop and no friction rub.   No murmur heard. Pulmonary/Chest: Effort normal and breath sounds normal. No respiratory distress. She has no wheezes. She has no rales.  Musculoskeletal: She exhibits edema (trace edema bilaterally).  Lymphadenopathy:    She has no cervical adenopathy.  Skin: She is not diaphoretic.  Vitals reviewed.     Assessment & Plan:     1. Bilateral edema of lower extremity Will check labs as below and f/u pending results for other cause. Will add furosemide as below. She is to take prn. If she is taking regularly she is to call the office so that we may recheck labs to check kidney function and electrolytes.  - CBC with Differential - Comprehensive Metabolic Panel (CMET) - B Nat Peptide - TSH - furosemide (LASIX) 20 MG tablet; Take 1 tablet (20 mg total) by mouth daily.  Dispense: 30 tablet; Refill: 1  2. Encounter for lipid screening for cardiovascular disease Will check labs as below and f/u pending results. -  Lipid Profile  3. Acute anxiety Stable. Diagnosis pulled for medication refill. Continue current medical treatment plan. - ALPRAZolam (XANAX) 0.5 MG tablet; Take 1 tablet (0.5 mg total) by mouth 2 (two) times daily as needed for anxiety.  Dispense: 60 tablet; Refill: 0  4. Encounter for smoking cessation counseling She is interested in discontinuing smoking. She has been trying to stop smoking on her own unsuccessfully. She smokes less than half a pack per day and has only smoked over the last 4 years. She reports stress as a trigger. - nicotine (NICODERM CQ) 14 mg/24hr patch; Place 1 patch (14 mg total) onto the skin  daily.  Dispense: 14 patch; Refill: 0 - nicotine (NICODERM CQ) 7 mg/24hr patch; Place 1 patch (7 mg total) onto the skin daily.  Dispense: 28 patch; Refill: 0       Margaretann Loveless, PA-C  [facility]

## 2015-10-10 ENCOUNTER — Telehealth: Payer: Self-pay | Admitting: Physician Assistant

## 2015-10-10 DIAGNOSIS — E7800 Pure hypercholesterolemia, unspecified: Secondary | ICD-10-CM

## 2015-10-10 LAB — CBC WITH DIFFERENTIAL/PLATELET
BASOS: 1 %
Basophils Absolute: 0.1 10*3/uL (ref 0.0–0.2)
EOS (ABSOLUTE): 0.3 10*3/uL (ref 0.0–0.4)
EOS: 3 %
HEMATOCRIT: 38.8 % (ref 34.0–46.6)
Hemoglobin: 12.8 g/dL (ref 11.1–15.9)
IMMATURE GRANULOCYTES: 0 %
Immature Grans (Abs): 0 10*3/uL (ref 0.0–0.1)
Lymphocytes Absolute: 3 10*3/uL (ref 0.7–3.1)
Lymphs: 29 %
MCH: 30 pg (ref 26.6–33.0)
MCHC: 33 g/dL (ref 31.5–35.7)
MCV: 91 fL (ref 79–97)
MONOS ABS: 0.5 10*3/uL (ref 0.1–0.9)
Monocytes: 5 %
NEUTROS ABS: 6.2 10*3/uL (ref 1.4–7.0)
NEUTROS PCT: 62 %
Platelets: 275 10*3/uL (ref 150–379)
RBC: 4.27 x10E6/uL (ref 3.77–5.28)
RDW: 13.4 % (ref 12.3–15.4)
WBC: 10.1 10*3/uL (ref 3.4–10.8)

## 2015-10-10 LAB — COMPREHENSIVE METABOLIC PANEL
A/G RATIO: 1.5 (ref 1.2–2.2)
ALT: 33 IU/L — ABNORMAL HIGH (ref 0–32)
AST: 26 IU/L (ref 0–40)
Albumin: 4.5 g/dL (ref 3.5–5.5)
Alkaline Phosphatase: 84 IU/L (ref 39–117)
BUN/Creatinine Ratio: 22 (ref 9–23)
BUN: 16 mg/dL (ref 6–24)
Bilirubin Total: 0.3 mg/dL (ref 0.0–1.2)
CALCIUM: 9.7 mg/dL (ref 8.7–10.2)
CO2: 24 mmol/L (ref 18–29)
Chloride: 101 mmol/L (ref 96–106)
Creatinine, Ser: 0.73 mg/dL (ref 0.57–1.00)
GFR, EST AFRICAN AMERICAN: 106 mL/min/{1.73_m2} (ref >59)
GFR, EST NON AFRICAN AMERICAN: 92 mL/min/{1.73_m2} (ref >59)
GLOBULIN, TOTAL: 3.1 g/dL (ref 1.5–4.5)
Glucose: 96 mg/dL (ref 65–99)
POTASSIUM: 5 mmol/L (ref 3.5–5.2)
SODIUM: 143 mmol/L (ref 134–144)
TOTAL PROTEIN: 7.6 g/dL (ref 6.0–8.5)

## 2015-10-10 LAB — LIPID PANEL
CHOL/HDL RATIO: 5 ratio — AB (ref 0.0–4.4)
Cholesterol, Total: 244 mg/dL — ABNORMAL HIGH (ref 100–199)
HDL: 49 mg/dL (ref >39)
LDL CALC: 152 mg/dL — AB (ref 0–99)
Triglycerides: 217 mg/dL — ABNORMAL HIGH (ref 0–149)
VLDL Cholesterol Cal: 43 mg/dL — ABNORMAL HIGH (ref 5–40)

## 2015-10-10 LAB — BRAIN NATRIURETIC PEPTIDE: BNP: 12.8 pg/mL (ref 0.0–100.0)

## 2015-10-10 LAB — TSH: TSH: 0.925 u[IU]/mL (ref 0.450–4.500)

## 2015-10-10 MED ORDER — PRAVASTATIN SODIUM 20 MG PO TABS: 20.0000 mg | ORAL_TABLET | Freq: Every day | ORAL | 1 refills | Status: DC

## 2015-10-10 NOTE — Telephone Encounter (Signed)
  Patient was advised as below. Patient wanted to know if you think she should increase her cholesterol medication? She reports that her elevated cholesterol could be related to her diet, but she seems to think that it is more heredity than anything. Please advise. Thanks!   Notes Recorded by Margaretann Loveless, PA-C on 10/10/2015 at 10:50 AM EDT All labs are within normal limits and stable with exception of cholesterol which is elevated. Limit fatty foods and foods high in cholesterol from diet. Add physical activity to regular routine. Will recheck cholesterol in 6 months. BNP has still not resulted. Will inform you of results once received. Thanks! -JB

## 2015-10-10 NOTE — Telephone Encounter (Signed)
Advised patient as below.  

## 2015-10-10 NOTE — Telephone Encounter (Signed)
Yes we will increase pravastatin to 20mg . Have her call if she develops muscle aches with increase. Will recheck in 6 months.

## 2015-10-10 NOTE — Telephone Encounter (Signed)
Pt is requesting results of lab work.

## 2015-10-17 ENCOUNTER — Telehealth: Payer: Self-pay | Admitting: Physician Assistant

## 2015-10-17 ENCOUNTER — Other Ambulatory Visit: Payer: Self-pay | Admitting: Physician Assistant

## 2015-10-17 DIAGNOSIS — E7800 Pure hypercholesterolemia, unspecified: Secondary | ICD-10-CM

## 2015-10-17 MED ORDER — PRAVASTATIN SODIUM 40 MG PO TABS: 80.0000 mg | ORAL_TABLET | Freq: Every day | ORAL | 1 refills | Status: DC

## 2015-10-17 NOTE — Telephone Encounter (Signed)
Pt called wanting to know what the dosing Pravastatin?  She dont know whether to take 40 mg or 80?  Her call back is [phone].  Thanks Barth Kirks

## 2015-10-17 NOTE — Telephone Encounter (Signed)
She has been taking the pravastatin 40mg  as prescribed. Will increase to 80mg . She is to continue working on lifestyle modifications. She is to call the office if myalgias increase. Will recheck labs in 6 months.

## 2015-10-27 ENCOUNTER — Ambulatory Visit (INDEPENDENT_AMBULATORY_CARE_PROVIDER_SITE_OTHER): Payer: 59 | Admitting: Physician Assistant

## 2015-10-27 ENCOUNTER — Encounter: Payer: Self-pay | Admitting: Physician Assistant

## 2015-10-27 VITALS — BP 134/82 | HR 80 | Temp 98.5°F | Resp 16 | Wt 255.0 lb

## 2015-10-27 DIAGNOSIS — L270 Generalized skin eruption due to drugs and medicaments taken internally: Secondary | ICD-10-CM

## 2015-10-27 DIAGNOSIS — M545 Low back pain: Secondary | ICD-10-CM | POA: Diagnosis not present

## 2015-10-27 MED ORDER — METHYLPREDNISOLONE ACETATE 80 MG/ML IJ SUSP
80.0000 mg | Freq: Once | INTRAMUSCULAR | Status: AC
  Administered 2015-10-27: 80 mg via INTRAMUSCULAR

## 2015-10-27 MED ORDER — NORCO 5-325 MG PO TABS: 1.0000 | ORAL_TABLET | Freq: Four times a day (QID) | ORAL | 0 refills | Status: DC | PRN

## 2015-10-27 MED ORDER — PREDNISONE 10 MG (21) PO TBPK: ORAL_TABLET | ORAL | 0 refills | Status: DC

## 2015-10-27 NOTE — Patient Instructions (Signed)
Drug Allergy Allergic reactions to medicines are common. Some allergic reactions are mild. A delayed type of drug allergy that occurs 1 week or more after exposure to a medicine or vaccine is called serum sickness. A life-threatening, sudden (acute) allergic reaction that involves the whole body is called anaphylaxis. CAUSES  "True" drug allergies occur when there is an allergic reaction to a medicine. This is caused by overactivity of the immune system. First, the body becomes sensitized. The immune system is triggered by your first exposure to the medicine. Following this first exposure, future exposure to the same medicine may be life-threatening. Almost any medicine can cause an allergic reaction. Common ones are:  Penicillin.  Sulfonamides (sulfa drugs).  Local anesthetics.  X-ray dyes that contain iodine. SYMPTOMS  Common symptoms of a minor allergic reaction are:  Swelling around the mouth.  An itchy red rash or hives.  Vomiting or diarrhea. Anaphylaxis can cause swelling of the mouth and throat. This makes it difficult to breathe and swallow. Severe reactions can be fatal within seconds, even after exposure to only a trace amount of the drug that causes the reaction. HOME CARE INSTRUCTIONS  If you are unsure of what caused your reaction, write down:  The names of the medicines you took.  How much medicine you took.  How you took the medicine, such as whether you took a pill, injected the medicine, or applied it to your skin.  All of the things you ate and drank.  The date and time of your reaction.  The symptoms of the reaction.  You may want to follow up with an allergy specialist after the reaction has cleared in order to be tested to confirm the allergy. It is important to confirm that your reaction is an allergy, not just a side effect to the medicine. If you have a true allergy to a medicine, this may prevent that medicine and related medicines from being given to  you when you are very ill.  If you have hives or a rash:  Take medicines as directed by your caregiver.  You may use an over-the-counter antihistamine (diphenhydramine) as needed.  Apply cold compresses to the skin or take baths in cool water. Avoid hot baths or showers.  If you are severely allergic:  Continuous observation after a severe reaction may be needed. Hospitalization is often required.  Wear a medical alert bracelet or necklace stating your allergy.  You and your family must learn how to use an anaphylaxis kit or give an epinephrine injection to temporarily treat an emergency allergic reaction. If you have had a severe reaction, always carry your epinephrine injection or anaphylaxis kit with you. This can be lifesaving if you have a severe reaction.  Do not drive or perform tasks after treatment until the medicines used to treat your reaction have worn off, or until your caregiver says it is okay.  If you have a drug allergy that was confirmed by your health care provider:  Carry information about the drug allergy with you at all times.  Always check with a pharmacist before taking any over-the-counter medicine. SEEK MEDICAL CARE IF:   You think you had an allergic reaction. Symptoms usually start within 30 minutes after exposure.  Symptoms are getting worse rather than better.  You develop new symptoms.  The symptoms that brought you to your caregiver return. SEEK IMMEDIATE MEDICAL CARE IF:   You have swelling of the mouth, difficulty breathing, or wheezing.  You have a tight  feeling in your chest or throat.  You develop hives, swelling, or itching all over your body.  You develop severe vomiting or diarrhea.  You feel faint or pass out. This is an emergency. Use your epinephrine injection or anaphylaxis kit as you have been instructed. Call for emergency medical help. Even if you improve after the injection, you need to be examined at a hospital emergency  department. MAKE SURE YOU:   Understand these instructions.  Will watch your condition.  Will get help right away if you are not doing well or get worse.   This information is not intended to replace advice given to you by your health care provider. Make sure you discuss any questions you have with your health care provider.   Document Released: 03/04/2005 Document Revised: 03/25/2014 Document Reviewed: 10/04/2014 Elsevier Interactive Patient Education 2016 [employer]  

## 2015-10-27 NOTE — Progress Notes (Signed)
Patient: Kristin Aguirre Female    DOB: September 25, 1960   55 y.o.   MRN: [id number] Visit Date: 10/27/2015  Today's Provider: Margaretann Loveless, PA-C   Chief Complaint  Patient presents with  . Rash   Subjective:    Rash  This is a new problem. The current episode started 1 to 4 weeks ago (x 3 weeks). The problem has been gradually worsening since onset. The affected locations include the chest, left lower leg, right lower leg and right arm. The rash is characterized by itchiness. Associated with: new medication Lasix, new lotion from [facility] and [employer], a new (used) washing machine. Associated symptoms include fatigue (worsening per pt) and joint pain (since increasing statin). Pertinent negatives include no anorexia, congestion, cough, diarrhea, eye pain, facial edema, fever, nail changes, rhinorrhea, shortness of breath, sore throat or vomiting. Past treatments include topical steroids. The treatment provided moderate relief.      No Known Allergies Current Meds  Medication Sig  . acetaminophen (TYLENOL) 325 MG tablet Take 650 mg by mouth every 6 (six) hours as needed.  . Ascorbic Acid (VITAMIN C PO) Take by mouth.  . citalopram (CELEXA) 40 MG tablet Take 1 tablet (40 mg total) by mouth daily.  . furosemide (LASIX) 20 MG tablet Take 1 tablet (20 mg total) by mouth daily.  . nicotine (NICODERM CQ) 7 mg/24hr patch Place 1 patch (7 mg total) onto the skin daily.  . NORCO 5-325 MG tablet Take 1 tablet by mouth every 6 (six) hours as needed for moderate pain.  . pravastatin (PRAVACHOL) 40 MG tablet Take 2 tablets (80 mg total) by mouth daily.  . Vitamin D, Cholecalciferol, 1000 UNITS TABS Take by mouth.  . [DISCONTINUED] lansoprazole (PREVACID) 30 MG capsule Take 1 capsule (30 mg total) by mouth daily at 12 noon.  . [DISCONTINUED] nicotine (NICODERM CQ) 14 mg/24hr patch Place 1 patch (14 mg total) onto the skin daily.    Review of Systems  Constitutional: Positive for fatigue  (worsening per pt). Negative for fever.  HENT: Negative for congestion, rhinorrhea and sore throat.   Eyes: Negative for pain.  Respiratory: Negative for cough and shortness of breath.   Gastrointestinal: Negative for anorexia, diarrhea and vomiting.  Musculoskeletal: Positive for back pain (needs Norco refilled) and joint pain (since increasing statin).  Skin: Positive for rash. Negative for nail changes.    Social History  Substance Use Topics  . Smoking status: Current Every Day Smoker    Packs/day: 0.25    Start date: 04/03/2012  . Smokeless tobacco: Never Used     Comment: SMokes 10 cigarettes daily  . Alcohol use 0.0 oz/week     Comment: occasional   Objective:   BP 134/82 (BP Location: Left Arm, Patient Position: Sitting, Cuff Size: Large)   Pulse 80   Temp 98.5 F (36.9 C) (Oral)   Resp 16   Wt 255 lb (115.7 kg)   LMP 03/18/2001 (Approximate)   BMI 37.12 kg/m   Physical Exam  Constitutional: She appears well-developed and well-nourished. No distress.  Neck: Normal range of motion. Neck supple.  Cardiovascular: Normal rate, regular rhythm and normal heart sounds.  Exam reveals no gallop and no friction rub.   No murmur heard. Pulmonary/Chest: Effort normal and breath sounds normal. No respiratory distress. She has no wheezes. She has no rales.  Musculoskeletal: She exhibits no edema.  Skin: Rash (diffusely located) noted. Rash is papular. She is not diaphoretic.  Vitals reviewed.     Assessment & Plan:     1. Rash, drug I feel the rash is secondary to furosemide since the timing correlates. She is to discontinue furosemide. Steroid injection given today with steroid taper to follow starting tomorrow. Steroid injection tolerated well. She is to call if symptoms do not resolve.  - predniSONE (STERAPRED UNI-PAK 21 TAB) 10 MG (21) TBPK tablet; Take as directed on package instructions.  Dispense: 21 tablet; Refill: 0 - methylPREDNISolone acetate (DEPO-MEDROL) injection  80 mg; Inject 1 mL (80 mg total) into the muscle once.  2. Low back pain without sciatica, unspecified back pain laterality Stable. Diagnosis pulled for medication refill. Continue current medical treatment plan. - NORCO 5-325 MG tablet; Take 1 tablet by mouth every 6 (six) hours as needed for moderate pain.  Dispense: 120 tablet; Refill: 0       Margaretann Loveless, PA-C  [facility]

## 2015-11-16 ENCOUNTER — Other Ambulatory Visit: Payer: Self-pay | Admitting: Family Medicine

## 2015-11-16 DIAGNOSIS — F419 Anxiety disorder, unspecified: Secondary | ICD-10-CM

## 2015-11-16 MED ORDER — ALPRAZOLAM 0.5 MG PO TABS: 0.5000 mg | ORAL_TABLET | Freq: Two times a day (BID) | ORAL | 3 refills | Status: DC | PRN

## 2015-11-16 NOTE — Telephone Encounter (Signed)
Please call in alprazolam.  

## 2015-11-16 NOTE — Telephone Encounter (Signed)
Patient is wanting a refill on her Xanax called to  [facility] on [address].

## 2015-11-16 NOTE — Telephone Encounter (Signed)
Rx called in to pharmacy. 

## 2015-11-23 ENCOUNTER — Telehealth: Payer: Self-pay | Admitting: Physician Assistant

## 2015-11-23 DIAGNOSIS — M545 Low back pain: Secondary | ICD-10-CM

## 2015-11-23 MED ORDER — HYDROCODONE-ACETAMINOPHEN 5-325 MG PO TABS: 1.0000 | ORAL_TABLET | Freq: Four times a day (QID) | ORAL | 0 refills | Status: DC | PRN

## 2015-11-23 NOTE — Telephone Encounter (Signed)
Rx printed
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
## 2015-12-20 ENCOUNTER — Telehealth: Payer: Self-pay | Admitting: Physician Assistant

## 2015-12-20 DIAGNOSIS — G8929 Other chronic pain: Secondary | ICD-10-CM

## 2015-12-20 DIAGNOSIS — M545 Low back pain, unspecified: Secondary | ICD-10-CM

## 2015-12-20 MED ORDER — HYDROCODONE-ACETAMINOPHEN 5-325 MG PO TABS: 1.0000 | ORAL_TABLET | Freq: Four times a day (QID) | ORAL | 0 refills | Status: DC | PRN

## 2015-12-20 NOTE — Telephone Encounter (Signed)
Called patient and advised as directed below.  Thanks,  -Joseline

## 2015-12-20 NOTE — Telephone Encounter (Signed)
Patient needs a refill on Norco

## 2015-12-20 NOTE — Telephone Encounter (Signed)
Please notify patient Rx printed and up front for pick up. 

## 2016-01-02 ENCOUNTER — Other Ambulatory Visit: Payer: Self-pay | Admitting: Physician Assistant

## 2016-01-02 DIAGNOSIS — Z1231 Encounter for screening mammogram for malignant neoplasm of breast: Secondary | ICD-10-CM

## 2016-01-15 ENCOUNTER — Telehealth: Payer: Self-pay | Admitting: Physician Assistant

## 2016-01-15 DIAGNOSIS — G8929 Other chronic pain: Secondary | ICD-10-CM

## 2016-01-15 DIAGNOSIS — M545 Low back pain, unspecified: Secondary | ICD-10-CM

## 2016-01-15 NOTE — Telephone Encounter (Signed)
Patient needs refill on Norco please. °

## 2016-01-16 MED ORDER — HYDROCODONE-ACETAMINOPHEN 5-325 MG PO TABS: 1.0000 | ORAL_TABLET | Freq: Four times a day (QID) | ORAL | 0 refills | Status: DC | PRN

## 2016-01-16 NOTE — Telephone Encounter (Signed)
Please notify Rx printed.

## 2016-02-02 ENCOUNTER — Ambulatory Visit
Admission: RE | Admit: 2016-02-02 | Discharge: 2016-02-02 | Disposition: A | Discharge status: Home / Self Care | Payer: 59 | Source: Ambulatory Visit | Attending: Physician Assistant | Admitting: Physician Assistant

## 2016-02-02 ENCOUNTER — Ambulatory Visit (INDEPENDENT_AMBULATORY_CARE_PROVIDER_SITE_OTHER): Payer: 59 | Admitting: Physician Assistant

## 2016-02-02 ENCOUNTER — Encounter: Payer: Self-pay | Admitting: Physician Assistant

## 2016-02-02 VITALS — BP 122/70 | HR 80 | Temp 98.0°F | Resp 16 | Wt 256.0 lb

## 2016-02-02 DIAGNOSIS — J069 Acute upper respiratory infection, unspecified: Secondary | ICD-10-CM

## 2016-02-02 DIAGNOSIS — R05 Cough: Secondary | ICD-10-CM | POA: Diagnosis not present

## 2016-02-02 DIAGNOSIS — R0602 Shortness of breath: Secondary | ICD-10-CM | POA: Diagnosis not present

## 2016-02-02 MED ORDER — DOXYCYCLINE HYCLATE 100 MG PO TABS: 100.0000 mg | ORAL_TABLET | Freq: Two times a day (BID) | ORAL | 0 refills | Status: AC

## 2016-02-02 MED ORDER — ALBUTEROL SULFATE HFA 108 (90 BASE) MCG/ACT IN AERS: 2.0000 | INHALATION_SPRAY | Freq: Four times a day (QID) | RESPIRATORY_TRACT | 2 refills | Status: DC | PRN

## 2016-02-02 NOTE — Progress Notes (Signed)
[facility]  Chief Complaint  Patient presents with  . URI    Subjective:    Patient ID: Kristin DesanctisVicky Harper Aguirre, female    DOB: [birth year], 55 y.o.   MRN: [id number]  Upper Respiratory Infection: Kristin DesanctisVicky Harper Southwell is a 55 y.o. female with a past medical history significant for Allergic Rhinitis, history of smoking quit three days ago, and sick contacts complaining of symptoms of a URI. Symptoms include bilateral ear pain, congestion, cough, plugged sensation in both ears and sore throat. Onset of symptoms was 1 day ago, gradually worsening since that time. She also c/o bilateral ear pressure/pain, congestion, nasal congestion and non productive cough for the past 1 day .  She is drinking plenty of fluids. Evaluation to date: none. Treatment to date: decongestants. The treatment has provided no relief.   Review of Systems  Constitutional: Positive for appetite change and fatigue. Negative for activity change, chills, diaphoresis, fever and unexpected weight change.  HENT: Positive for congestion, ear pain and sore throat. Negative for ear discharge, postnasal drip, rhinorrhea, sinus pain, sinus pressure, sneezing, tinnitus, trouble swallowing and voice change.   Eyes: Negative.   Respiratory: Positive for cough and chest tightness. Negative for apnea, choking, shortness of breath and wheezing.   Gastrointestinal: Negative.        Pt reports having some diarrhea yesterday but it has improved.    Musculoskeletal: Negative for arthralgias, back pain, gait problem, joint swelling, myalgias, neck pain and neck stiffness.  Neurological: Positive for weakness and headaches. Negative for dizziness and light-headedness.       Objective:   BP 122/70 (BP Location: Left Arm, Patient Position: Sitting, Cuff Size: Large)   Pulse 80   Temp 98 F (36.7 C) (Oral)   Resp 16   Wt 256 lb (116.1 kg)   LMP 03/18/2001 (Approximate)   BMI 37.26 kg/m   Patient  Active Problem List   Diagnosis Date Noted  . Left-sided chest wall pain 08/10/2015  . Allergic rhinitis 01/31/2015  . Airway hyperreactivity 01/31/2015  . Atypical chest pain 01/31/2015  . Baker's cyst of knee 01/31/2015  . Chest pressure 01/31/2015  . Chronic headache 01/31/2015  . Tobacco use 01/31/2015  . Contact with and suspected exposure to infections with predominantly sexual mode of transmission 01/31/2015  . Acid reflux 01/31/2015  . Cannot sleep 01/31/2015  . Panic attack 01/31/2015  . Postsurgical menopause 01/31/2015  . Breath shortness 01/31/2015  . Avitaminosis D 01/31/2015  . Low back pain 11/29/2014  . Anxiety 08/26/2014  . Anxiety, generalized 01/14/2006  . Hyperlipidemia 01/14/2006    Outpatient Encounter Prescriptions as of 02/02/2016  Medication Sig Note  . acetaminophen (TYLENOL) 325 MG tablet Take 650 mg by mouth every 6 (six) hours as needed.   Marland Kitchen. albuterol (PROVENTIL HFA;VENTOLIN HFA) 108 (90 Base) MCG/ACT inhaler Inhale 2 puffs into the lungs every 6 (six) hours as needed for wheezing or shortness of breath.   . ALPRAZolam (XANAX) 0.5 MG tablet Take 1 tablet (0.5 mg total) by mouth 2 (two) times daily as needed for anxiety.   . Ascorbic Acid (VITAMIN C PO) Take by mouth. 01/31/2015: [facility]  . citalopram (CELEXA) 40 MG tablet Take 1 tablet (40 mg total) by mouth daily.   Marland Kitchen. doxycycline (VIBRA-TABS) 100 MG tablet Take 1 tablet (100 mg total) by mouth 2 (two) times daily.   Marland Kitchen. HYDROcodone-acetaminophen (NORCO) 5-325 MG tablet Take 1 tablet by mouth every 6 (six)  hours as needed for moderate pain.   . nicotine (NICODERM CQ) 7 mg/24hr patch Place 1 patch (7 mg total) onto the skin daily.   . pravastatin (PRAVACHOL) 40 MG tablet Take 2 tablets (80 mg total) by mouth daily.   . predniSONE (STERAPRED UNI-PAK 21 TAB) 10 MG (21) TBPK tablet Take as directed on package instructions.   . Vitamin D, Cholecalciferol, 1000 UNITS TABS  Take by mouth. 01/31/2015: Received from: [facility]   No facility-administered encounter medications on file as of 02/02/2016.     Allergies  Allergen Reactions  . Furosemide Rash       Physical Exam  Constitutional: She is oriented to person, place, and time. She appears well-developed and well-nourished. She appears ill.  HENT:  Right Ear: External ear normal.  Left Ear: External ear normal.  Mouth/Throat: Oropharynx is clear and moist. No oropharyngeal exudate.  Eyes: Right eye exhibits discharge. Left eye exhibits discharge.  Neck: Neck supple.  Cardiovascular: Normal rate and regular rhythm.   Pulmonary/Chest: Effort normal and breath sounds normal. No respiratory distress. She has no wheezes. She has no rales.  Lymphadenopathy:    She has no cervical adenopathy.  Neurological: She is alert and oriented to person, place, and time.  Skin: Skin is warm and dry.  Psychiatric: She has a normal mood and affect. Her behavior is normal.  Vitals reviewed.      Assessment & Plan:   Problem List Items Addressed This Visit    None    Visit Diagnoses    Upper respiratory tract infection, unspecified type    -  Primary   Relevant Medications   doxycycline (VIBRA-TABS) 100 MG tablet   Other Relevant Orders   CBC with Differential   DG Chest 2 View   SOB (shortness of breath)       Relevant Medications   albuterol (PROVENTIL HFA;VENTOLIN HFA) 108 (90 Base) MCG/ACT inhaler     Problem List Items Addressed This Visit    None    Visit Diagnoses    Upper respiratory tract infection, unspecified type    -  Primary   Relevant Medications   doxycycline (VIBRA-TABS) 100 MG tablet   Other Relevant Orders   CBC with Differential   DG Chest 2 View   SOB (shortness of breath)       Relevant Medications   albuterol (PROVENTIL HFA;VENTOLIN HFA) 108 (90 Base) MCG/ACT inhaler     Patient is 55 y/o presenting with URI symptoms. Onset of symptoms for one day,  non productive cough. Patient does look sick in exam room and complaining of weakness. Will evaluate as above. Considering we are going into weekend, have given hard script for doxycycline to be filled if there is finding on CXR or if patient continues to worsen. Return precautions counseled.  Recommend rest, fluids, frequent hand washing. Work note provided  Patient Instructions  Upper Respiratory Infection, Adult Most upper respiratory infections (URIs) are caused by a virus. A URI affects the nose, throat, and upper air passages. The most common type of URI is often called "the common cold." Follow these instructions at home:  Take medicines only as told by your doctor.  Gargle warm saltwater or take cough drops to comfort your throat as told by your doctor.  Use a warm mist humidifier or inhale steam from a shower to increase air moisture. This may make it easier to breathe.  Drink enough fluid to keep your pee (urine) clear or pale  yellow.  Eat soups and other clear broths.  Have a healthy diet.  Rest as needed.  Go back to work when your fever is gone or your doctor says it is okay.  You may need to stay home longer to avoid giving your URI to others.  You can also wear a face mask and wash your hands often to prevent spread of the virus.  Use your inhaler more if you have asthma.  Do not use any tobacco products, including cigarettes, chewing tobacco, or electronic cigarettes. If you need help quitting, ask your doctor. Contact a doctor if:  You are getting worse, not better.  Your symptoms are not helped by medicine.  You have chills.  You are getting more short of breath.  You have brown or red mucus.  You have yellow or brown discharge from your nose.  You have pain in your face, especially when you bend forward.  You have a fever.  You have puffy (swollen) neck glands.  You have pain while swallowing.  You have white areas in the back of your  throat. Get help right away if:  You have very bad or constant:  Headache.  Ear pain.  Pain in your forehead, behind your eyes, and over your cheekbones (sinus pain).  Chest pain.  You have long-lasting (chronic) lung disease and any of the following:  Wheezing.  Long-lasting cough.  Coughing up blood.  A change in your usual mucus.  You have a stiff neck.  You have changes in your:  Vision.  Hearing.  Thinking.  Mood. This information is not intended to replace advice given to you by your health care provider. Make sure you discuss any questions you have with your health care provider. Document Released: 08/21/2007 Document Revised: 11/05/2015 Document Reviewed: 06/09/2013 Elsevier Interactive Patient Education  2017 [employer].     The entirety of the information documented in the History of Present Illness, Review of Systems and Physical Exam were personally obtained by me. Portions of this information were initially documented by Kavin Leech, CMA and reviewed by me for thoroughness and accuracy.

## 2016-02-02 NOTE — Patient Instructions (Signed)
Upper Respiratory Infection, Adult Most upper respiratory infections (URIs) are caused by a virus. A URI affects the nose, throat, and upper air passages. The most common type of URI is often called "the common cold." Follow these instructions at home:  Take medicines only as told by your doctor.  Gargle warm saltwater or take cough drops to comfort your throat as told by your doctor.  Use a warm mist humidifier or inhale steam from a shower to increase air moisture. This may make it easier to breathe.  Drink enough fluid to keep your pee (urine) clear or pale yellow.  Eat soups and other clear broths.  Have a healthy diet.  Rest as needed.  Go back to work when your fever is gone or your doctor says it is okay.  You may need to stay home longer to avoid giving your URI to others.  You can also wear a face mask and wash your hands often to prevent spread of the virus.  Use your inhaler more if you have asthma.  Do not use any tobacco products, including cigarettes, chewing tobacco, or electronic cigarettes. If you need help quitting, ask your doctor. Contact a doctor if:  You are getting worse, not better.  Your symptoms are not helped by medicine.  You have chills.  You are getting more short of breath.  You have brown or red mucus.  You have yellow or brown discharge from your nose.  You have pain in your face, especially when you bend forward.  You have a fever.  You have puffy (swollen) neck glands.  You have pain while swallowing.  You have white areas in the back of your throat. Get help right away if:  You have very bad or constant:  Headache.  Ear pain.  Pain in your forehead, behind your eyes, and over your cheekbones (sinus pain).  Chest pain.  You have long-lasting (chronic) lung disease and any of the following:  Wheezing.  Long-lasting cough.  Coughing up blood.  A change in your usual mucus.  You have a stiff neck.  You have  changes in your:  Vision.  Hearing.  Thinking.  Mood. This information is not intended to replace advice given to you by your health care provider. Make sure you discuss any questions you have with your health care provider. Document Released: 08/21/2007 Document Revised: 11/05/2015 Document Reviewed: 06/09/2013 Elsevier Interactive Patient Education  2017 [employer] Inc[employer]  

## 2016-02-03 LAB — CBC WITH DIFFERENTIAL/PLATELET
Basophils Absolute: 0.1 10*3/uL (ref 0.0–0.2)
Basos: 1 %
EOS (ABSOLUTE): 0.4 10*3/uL (ref 0.0–0.4)
Eos: 3 %
Hematocrit: 35.7 % (ref 34.0–46.6)
Hemoglobin: 11.9 g/dL (ref 11.1–15.9)
Immature Grans (Abs): 0 10*3/uL (ref 0.0–0.1)
Immature Granulocytes: 0 %
Lymphocytes Absolute: 2.6 10*3/uL (ref 0.7–3.1)
Lymphs: 22 %
MCH: 30.1 pg (ref 26.6–33.0)
MCHC: 33.3 g/dL (ref 31.5–35.7)
MCV: 90 fL (ref 79–97)
Monocytes Absolute: 0.6 10*3/uL (ref 0.1–0.9)
Monocytes: 5 %
Neutrophils Absolute: 8.2 10*3/uL — ABNORMAL HIGH (ref 1.4–7.0)
Neutrophils: 69 %
Platelets: 289 10*3/uL (ref 150–379)
RBC: 3.96 x10E6/uL (ref 3.77–5.28)
RDW: 12.9 % (ref 12.3–15.4)
WBC: 11.9 10*3/uL — ABNORMAL HIGH (ref 3.4–10.8)

## 2016-02-05 ENCOUNTER — Telehealth: Payer: Self-pay

## 2016-02-05 NOTE — Telephone Encounter (Signed)
Patient was advised she states over weekend her symptoms got worse. Patient reports the following: cough, sore throat, hoarseness, headache and sinus pressure. She has been taking otc Advil Cold and Sinus and states that she is not any better. Patient wants to know what she should do about her symptoms since x-ray was normal. Kristin Aguirre

## 2016-02-05 NOTE — Progress Notes (Signed)
Patient has been advised of report(s). KW

## 2016-02-05 NOTE — Telephone Encounter (Signed)
CBC just returned, does indicated bacterial infection. If patient has not yet, she should try filling her prescription for doxycyline and take this. Thank you.

## 2016-02-05 NOTE — Telephone Encounter (Signed)
-----   Message from Trey Sailors, [place] sent at 02/02/2016  2:01 PM EST ----- Normal CXR. No evidence of pneumonia. No current indication for antibiotics.

## 2016-02-05 NOTE — Telephone Encounter (Signed)
Patient has been advised and instructed to start Doxycyline.KW

## 2016-02-07 ENCOUNTER — Ambulatory Visit: Payer: Self-pay

## 2016-02-12 ENCOUNTER — Telehealth: Payer: Self-pay | Admitting: Physician Assistant

## 2016-02-12 DIAGNOSIS — M545 Low back pain: Principal | ICD-10-CM

## 2016-02-12 DIAGNOSIS — G8929 Other chronic pain: Secondary | ICD-10-CM

## 2016-02-12 MED ORDER — HYDROCODONE-ACETAMINOPHEN 5-325 MG PO TABS: 1.0000 | ORAL_TABLET | Freq: Four times a day (QID) | ORAL | 0 refills | Status: DC | PRN

## 2016-02-12 NOTE — Telephone Encounter (Signed)
Patient needs refill on Norco please

## 2016-02-12 NOTE — Telephone Encounter (Signed)
Norco refilled. State substance abuse website checked. Patient has only had Norco filled from me and does not pharmacy shop.

## 2016-02-16 ENCOUNTER — Telehealth: Payer: Self-pay | Admitting: Physician Assistant

## 2016-02-16 DIAGNOSIS — R05 Cough: Secondary | ICD-10-CM

## 2016-02-16 DIAGNOSIS — R059 Cough, unspecified: Secondary | ICD-10-CM

## 2016-02-16 MED ORDER — FLUTICASONE PROPIONATE 50 MCG/ACT NA SUSP: 2.0000 | Freq: Every day | NASAL | 6 refills | Status: DC

## 2016-02-16 MED ORDER — AZITHROMYCIN 250 MG PO TABS: ORAL_TABLET | ORAL | 0 refills | Status: DC

## 2016-02-16 NOTE — Telephone Encounter (Signed)
CXR clear, ear pain throat pain and congestion likely sinus/allergies/PND. Even a week of doxycycline should have cleared any infection. Will give flonase for ear pain and congestion, two sprays in each nostril daily. Should take allergy medication like Zyrtec or Claritin. Z-pack is good next alternative, but there is a small risk of interaction with citalopram. Does patient want this?

## 2016-02-16 NOTE — Telephone Encounter (Signed)
Patient advised. Patient would like to go ahead and get Zpack-generic. Patient advised about trying nasal spray and allergy medications. She states she has been coughing up thick phlegm and would like to go ahead with Zpack-aa

## 2016-02-16 NOTE — Telephone Encounter (Signed)
Please review-aa 

## 2016-02-16 NOTE — Telephone Encounter (Signed)
Zpack sent.  

## 2016-02-16 NOTE — Telephone Encounter (Signed)
Pt states she rec'd an antibiotic about 2 weeks ago for cough and congestion.  Pt states she only took this for a week and had to stop taking due to it made her feel sick to her stomach/nausea.  Pt states she is still having ear pain, sore throat, cough and congestion.  Pt feels like she has no energy.  Pt is requesting a different Rx.  [facility] [address]  JJ#[phone]

## 2016-02-21 ENCOUNTER — Ambulatory Visit
Admission: RE | Admit: 2016-02-21 | Discharge: 2016-02-21 | Disposition: A | Discharge status: Home / Self Care | Payer: 59 | Source: Ambulatory Visit | Attending: Physician Assistant | Admitting: Physician Assistant

## 2016-02-21 ENCOUNTER — Ambulatory Visit: Payer: 59

## 2016-02-21 DIAGNOSIS — Z1231 Encounter for screening mammogram for malignant neoplasm of breast: Secondary | ICD-10-CM | POA: Diagnosis not present

## 2016-02-22 ENCOUNTER — Telehealth: Payer: Self-pay

## 2016-02-22 NOTE — Telephone Encounter (Signed)
-----   Message from Margaretann Loveless, [place] sent at 02/22/2016 11:06 AM EST ----- Normal mammogram. Repeat screening in one year.

## 2016-02-22 NOTE — Telephone Encounter (Signed)
Pt advised. Aqib Lough Drozdowski, CMA  

## 2016-03-05 ENCOUNTER — Telehealth: Payer: Self-pay | Admitting: Physician Assistant

## 2016-03-05 NOTE — Telephone Encounter (Signed)
Patient needs rx for norco please.

## 2016-03-06 NOTE — Telephone Encounter (Signed)
Ok thank you 

## 2016-03-06 NOTE — Telephone Encounter (Signed)
I told her she can pick it up on 03/12/16 and get it filled on the 27th. Dont want [place] to be manipulated.

## 2016-03-06 NOTE — Telephone Encounter (Signed)
Patient not due for refill until 12/27. I can post date if she is just needing to pick up early because of the holidays but she will not be able to fill until then.

## 2016-03-12 ENCOUNTER — Other Ambulatory Visit: Payer: Self-pay | Admitting: Physician Assistant

## 2016-03-12 DIAGNOSIS — G8929 Other chronic pain: Secondary | ICD-10-CM

## 2016-03-12 DIAGNOSIS — M545 Low back pain, unspecified: Secondary | ICD-10-CM

## 2016-03-12 MED ORDER — HYDROCODONE-ACETAMINOPHEN 5-325 MG PO TABS: 1.0000 | ORAL_TABLET | Freq: Four times a day (QID) | ORAL | 0 refills | Status: DC | PRN

## 2016-03-12 NOTE — Progress Notes (Signed)
Refilled Norco

## 2016-04-09 ENCOUNTER — Telehealth: Payer: Self-pay | Admitting: Physician Assistant

## 2016-04-09 DIAGNOSIS — G8929 Other chronic pain: Secondary | ICD-10-CM

## 2016-04-09 DIAGNOSIS — M545 Low back pain, unspecified: Secondary | ICD-10-CM

## 2016-04-09 MED ORDER — HYDROCODONE-ACETAMINOPHEN 5-325 MG PO TABS: 1.0000 | ORAL_TABLET | Freq: Four times a day (QID) | ORAL | 0 refills | Status: DC | PRN

## 2016-04-09 NOTE — Telephone Encounter (Signed)
Patient needs her rx for Norco

## 2016-04-09 NOTE — Telephone Encounter (Signed)
Rx printed

## 2016-04-17 ENCOUNTER — Ambulatory Visit (INDEPENDENT_AMBULATORY_CARE_PROVIDER_SITE_OTHER): Payer: 59 | Admitting: Physician Assistant

## 2016-04-17 ENCOUNTER — Telehealth: Payer: Self-pay

## 2016-04-17 ENCOUNTER — Encounter: Payer: Self-pay | Admitting: Physician Assistant

## 2016-04-17 VITALS — BP 142/80 | HR 73 | Temp 97.6°F | Resp 16 | Ht 69.0 in | Wt 254.4 lb

## 2016-04-17 DIAGNOSIS — F419 Anxiety disorder, unspecified: Secondary | ICD-10-CM

## 2016-04-17 DIAGNOSIS — Z136 Encounter for screening for cardiovascular disorders: Secondary | ICD-10-CM

## 2016-04-17 DIAGNOSIS — Z0000 Encounter for general adult medical examination without abnormal findings: Secondary | ICD-10-CM

## 2016-04-17 DIAGNOSIS — Z833 Family history of diabetes mellitus: Secondary | ICD-10-CM

## 2016-04-17 DIAGNOSIS — Z23 Encounter for immunization: Secondary | ICD-10-CM

## 2016-04-17 DIAGNOSIS — B9689 Other specified bacterial agents as the cause of diseases classified elsewhere: Secondary | ICD-10-CM | POA: Diagnosis not present

## 2016-04-17 DIAGNOSIS — Z1239 Encounter for other screening for malignant neoplasm of breast: Secondary | ICD-10-CM

## 2016-04-17 DIAGNOSIS — Z124 Encounter for screening for malignant neoplasm of cervix: Secondary | ICD-10-CM

## 2016-04-17 DIAGNOSIS — R601 Generalized edema: Secondary | ICD-10-CM

## 2016-04-17 DIAGNOSIS — Z13220 Encounter for screening for lipoid disorders: Secondary | ICD-10-CM

## 2016-04-17 DIAGNOSIS — N760 Acute vaginitis: Secondary | ICD-10-CM

## 2016-04-17 DIAGNOSIS — Z1231 Encounter for screening mammogram for malignant neoplasm of breast: Secondary | ICD-10-CM

## 2016-04-17 DIAGNOSIS — Z1211 Encounter for screening for malignant neoplasm of colon: Secondary | ICD-10-CM | POA: Diagnosis not present

## 2016-04-17 DIAGNOSIS — Z1159 Encounter for screening for other viral diseases: Secondary | ICD-10-CM

## 2016-04-17 MED ORDER — ALPRAZOLAM 0.5 MG PO TABS: 0.5000 mg | ORAL_TABLET | Freq: Two times a day (BID) | ORAL | 3 refills | Status: DC | PRN

## 2016-04-17 MED ORDER — METRONIDAZOLE 500 MG PO TABS: 500.0000 mg | ORAL_TABLET | Freq: Two times a day (BID) | ORAL | 0 refills | Status: DC

## 2016-04-17 MED ORDER — HYDROCHLOROTHIAZIDE 25 MG PO TABS: 25.0000 mg | ORAL_TABLET | Freq: Every day | ORAL | 3 refills | Status: DC

## 2016-04-17 MED ORDER — ALPRAZOLAM 0.5 MG PO TABS: 0.5000 mg | ORAL_TABLET | Freq: Two times a day (BID) | ORAL | 5 refills | Status: DC | PRN

## 2016-04-17 NOTE — Progress Notes (Signed)
Patient: Kristin Aguirre, Female    DOB: 06/08/1960, 56 y.o.   MRN: [id number] Visit Date: 04/17/2016  Today's Provider: Margaretann Loveless, PA-C   Chief Complaint  Patient presents with  . Annual Exam   Subjective:    Annual physical exam Kristin Aguirre is a 56 y.o. female who presents today for health maintenance and complete physical. She feels fairly well. She reports exercising some. She reports she is sleeping well.  Mammogram:02/21/16 BI-RADS 1 Pap: Total hysterectomy in 2003. Hx of abnormal pap more than 10 years ago. -----------------------------------------------------------------   Review of Systems  Constitutional: Positive for fatigue.  HENT: Negative.   Eyes: Negative.   Respiratory: Negative.   Cardiovascular: Positive for leg swelling.  Gastrointestinal: Negative.   Endocrine: Negative.   Genitourinary: Negative.   Musculoskeletal: Negative.   Skin: Negative.   Allergic/Immunologic: Negative.   Neurological: Positive for headaches.  Hematological: Negative.   Psychiatric/Behavioral: Negative.     Social History      She  reports that she has been smoking.  She started smoking about 4 years ago. She has been smoking about 0.25 packs per day. She has never used smokeless tobacco. She reports that she drinks alcohol. She reports that she does not use drugs.       Social History   Social History  . Marital status: Single    Spouse name: N/A  . Number of children: N/A  . Years of education: N/A   Social History Main Topics  . Smoking status: Current Every Day Smoker    Packs/day: 0.25    Start date: 04/03/2012  . Smokeless tobacco: Never Used     Comment: SMokes 10 cigarettes daily  . Alcohol use 0.0 oz/week     Comment: occasional  . Drug use: No  . Sexual activity: Not Asked   Other Topics Concern  . None   Social History Narrative  . None    Past Medical History:  Diagnosis Date  . Allergy   . Anxiety   . Diffuse  cystic mastopathy   . High cholesterol      Patient Active Problem List   Diagnosis Date Noted  . Left-sided chest wall pain 08/10/2015  . Allergic rhinitis 01/31/2015  . Airway hyperreactivity 01/31/2015  . Atypical chest pain 01/31/2015  . Baker's cyst of knee 01/31/2015  . Chest pressure 01/31/2015  . Chronic headache 01/31/2015  . Tobacco use 01/31/2015  . Contact with and suspected exposure to infections with predominantly sexual mode of transmission 01/31/2015  . Acid reflux 01/31/2015  . Cannot sleep 01/31/2015  . Panic attack 01/31/2015  . Postsurgical menopause 01/31/2015  . Breath shortness 01/31/2015  . Avitaminosis D 01/31/2015  . Low back pain 11/29/2014  . Anxiety 08/26/2014  . Anxiety, generalized 01/14/2006  . Hyperlipidemia 01/14/2006    Past Surgical History:  Procedure Laterality Date  . ABDOMINAL HYSTERECTOMY  2003  . BREAST BIOPSY Left 2011, 2015   neg  . 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  Marland Kitchen cervical spine repair     . TONSILLECTOMY      Family History        Family Status  Relation Status  . Mother Deceased at age 12  . Father Deceased at age 70  . Sister Alive  . Brother Alive   spinal meningitits age 21  . Maternal  Grandmother         Her family history includes Anemia in her sister; Asthma in her sister; Breast cancer in her maternal grandmother; CVA in her father; Cancer in her mother; Diabetes in her father and mother; Heart block in her mother; Heart disease in her father and mother; Seizures in her brother.     Allergies  Allergen Reactions  . Furosemide Rash     Current Outpatient Prescriptions:  .  acetaminophen (TYLENOL) 325 MG tablet, Take 650 mg by mouth every 6 (six) hours as needed., Disp: , Rfl:  .  albuterol (PROVENTIL HFA;VENTOLIN HFA) 108 (90 Base) MCG/ACT inhaler,  Inhale 2 puffs into the lungs every 6 (six) hours as needed for wheezing or shortness of breath., Disp: 1 Inhaler, Rfl: 2 .  ALPRAZolam (XANAX) 0.5 MG tablet, Take 1 tablet (0.5 mg total) by mouth 2 (two) times daily as needed for anxiety., Disp: 60 tablet, Rfl: 3 .  Ascorbic Acid (VITAMIN C PO), Take by mouth., Disp: , Rfl:  .  citalopram (CELEXA) 40 MG tablet, Take 1 tablet (40 mg total) by mouth daily., Disp: 90 tablet, Rfl: 3 .  fluticasone (FLONASE) 50 MCG/ACT nasal spray, Place 2 sprays into both nostrils daily., Disp: 16 g, Rfl: 6 .  HYDROcodone-acetaminophen (NORCO) 5-325 MG tablet, Take 1 tablet by mouth every 6 (six) hours as needed for moderate pain., Disp: 120 tablet, Rfl: 0 .  nicotine (NICODERM CQ) 7 mg/24hr patch, Place 1 patch (7 mg total) onto the skin daily., Disp: 28 patch, Rfl: 0 .  pravastatin (PRAVACHOL) 40 MG tablet, Take 2 tablets (80 mg total) by mouth daily., Disp: 180 tablet, Rfl: 1 .  Vitamin D, Cholecalciferol, 1000 UNITS TABS, Take by mouth., Disp: , Rfl:  .  azithromycin (ZITHROMAX) 250 MG tablet, Two pills on first day, one pill a day for four days. (Patient not taking: Reported on 04/17/2016), Disp: 6 tablet, Rfl: 0 .  predniSONE (STERAPRED UNI-PAK 21 TAB) 10 MG (21) TBPK tablet, Take as directed on package instructions. (Patient not taking: Reported on 04/17/2016), Disp: 21 tablet, Rfl: 0   Patient Care Team: Lorie Phenix, MD as PCP - General (Family Medicine) Earline Mayotte, MD as Consulting Physician (General Surgery) Jim Like, RN as Registered Nurse Scarlett Presto, RN as Registered Nurse      Objective:   Vitals: BP (!) 142/80 (BP Location: Right Arm, Patient Position: Sitting, Cuff Size: Normal)   Pulse 73   Temp 97.6 F (36.4 C) (Oral)   Resp 16   Ht 5\' 9"  (1.753 m)   Wt 254 lb 6.4 oz (115.4 kg)   LMP 03/18/2001 (Approximate)   BMI 37.57 kg/m    Physical Exam  Constitutional: She is oriented to person, place, and time. She appears  well-developed and well-nourished. No distress.  HENT:  Head: Normocephalic and atraumatic.  Right Ear: Hearing, tympanic membrane, external ear and ear Aguirre normal.  Left Ear: Hearing, tympanic membrane, external ear and ear Aguirre normal.  Nose: Nose normal.  Mouth/Throat: Uvula is midline, oropharynx is clear and moist and mucous membranes are normal. No oropharyngeal exudate.  Eyes: Conjunctivae and EOM are normal. Pupils are equal, round, and reactive to light. Right eye exhibits no discharge. Left eye exhibits no discharge. No scleral icterus.  Neck: Normal range of motion. Neck supple. No JVD present. No tracheal deviation present. No thyromegaly present.  Cardiovascular: Normal rate, regular rhythm, normal heart sounds and intact distal pulses.  Exam reveals  no gallop and no friction rub.   No murmur heard. Pulmonary/Chest: Effort normal and breath sounds normal. No respiratory distress. She has no wheezes. She has no rales. She exhibits no tenderness. Right breast exhibits no inverted nipple, no mass, no nipple discharge, no skin change and no tenderness. Left breast exhibits no inverted nipple, no mass, no nipple discharge, no skin change and no tenderness. Breasts are symmetrical.  Abdominal: Soft. Bowel sounds are normal. She exhibits no distension and no mass. There is no tenderness. There is no rebound and no guarding.  Musculoskeletal: Normal range of motion. She exhibits no edema or tenderness.  Lymphadenopathy:    She has no cervical adenopathy.  Neurological: She is alert and oriented to person, place, and time.  Skin: Skin is warm and dry. No rash noted. She is not diaphoretic.  Psychiatric: She has a normal mood and affect. Her behavior is normal. Judgment and thought content normal.  Vitals reviewed.    Depression Screen No flowsheet data found.    Assessment & Plan:     Routine Health Maintenance and Physical Exam  Exercise Activities and Dietary  recommendations Goals    None       There is no immunization history on file for this patient.  Health Maintenance  Topic Date Due  . Hepatitis C Screening  [birth year]  . HIV Screening  09/09/1974  . TETANUS/TDAP  09/09/1978  . PAP SMEAR  09/08/1980  . COLONOSCOPY  09/08/2009  . INFLUENZA VACCINE  10/17/2015  . MAMMOGRAM  02/20/2018     Discussed health benefits of physical activity, and encouraged her to engage in regular exercise appropriate for her age and condition.   1. Annual physical exam Normal physical exam today. Will check labs as below and f/u pending lab results. If labs are stable and WNL she will not need to have these rechecked for one year at her next annual physical exam. She is to call the office in the meantime if she has any acute issue, questions or concerns. - CBC with Differential/Platelet - Comprehensive metabolic panel - TSH  2. Breast cancer screening Breast exam today was normal. There is no family history of breast cancer. She does perform regular self breast exams. Mammogram Is to be done at [facility]. Patient was advised to call for mammogram.  3. Cervical cancer screening Patient had a hysterectomy in 2003. She reports that she did have an abnormal Pap prior to her hysterectomy. She has not had Pap smears since. There is no history of cancer. Patient refused vaginal Pap today.  4. Colon cancer screening Patient refuses at this time.  5. Need for hepatitis C screening test - Hepatitis C antibody  6. Family history of diabetes mellitus in father Will check labs as below and f/u pending results. - Hemoglobin A1c  7. Encounter for lipid screening for cardiovascular disease Will check labs as below and f/u pending results. - Lipid panel  8. Need for influenza vaccination Flu vaccine given today without complication. Patient sat upright for 15 minutes to check for adverse reaction before being released. - Flu Vaccine QUAD 36+ mos  IM  9. Generalized edema Stable. Diagnosis pulled for medication refill. Continue current medical treatment plan. - hydrochlorothiazide (HYDRODIURIL) 25 MG tablet; Take 1 tablet (25 mg total) by mouth daily.  Dispense: 90 tablet; Refill: 3  10. BV (bacterial vaginosis) Stable. Diagnosis pulled for medication refill. Continue current medical treatment plan. - metroNIDAZOLE (FLAGYL) 500 MG tablet; Take 1 tablet (  500 mg total) by mouth 2 (two) times daily.  Dispense: 14 tablet; Refill: 0  11. Acute anxiety Stable. Diagnosis pulled for medication refill. Continue current medical treatment plan. - ALPRAZolam (XANAX) 0.5 MG tablet; Take 1 tablet (0.5 mg total) by mouth 2 (two) times daily as needed for anxiety.  Dispense: 60 tablet; Refill: 3  --------------------------------------------------------------------    Margaretann Loveless, PA-C  [facility]

## 2016-04-17 NOTE — Patient Instructions (Signed)

## 2016-04-17 NOTE — Telephone Encounter (Signed)
Rx for Alprazolam was called in to [facility] in [place].  Thanks,  -Joseline

## 2016-04-23 ENCOUNTER — Telehealth: Payer: Self-pay

## 2016-04-23 LAB — COMPREHENSIVE METABOLIC PANEL
ALBUMIN: 4.5 g/dL (ref 3.5–5.5)
ALT: 26 IU/L (ref 0–32)
AST: 24 IU/L (ref 0–40)
Albumin/Globulin Ratio: 1.5 (ref 1.2–2.2)
Alkaline Phosphatase: 85 IU/L (ref 39–117)
BUN / CREAT RATIO: 22 (ref 9–23)
BUN: 14 mg/dL (ref 6–24)
Bilirubin Total: 0.4 mg/dL (ref 0.0–1.2)
CALCIUM: 9.9 mg/dL (ref 8.7–10.2)
CHLORIDE: 97 mmol/L (ref 96–106)
CO2: 25 mmol/L (ref 18–29)
CREATININE: 0.63 mg/dL (ref 0.57–1.00)
GFR calc non Af Amer: 101 mL/min/{1.73_m2} (ref >59)
GFR, EST AFRICAN AMERICAN: 116 mL/min/{1.73_m2} (ref >59)
GLUCOSE: 103 mg/dL — AB (ref 65–99)
Globulin, Total: 3.1 g/dL (ref 1.5–4.5)
Potassium: 4.9 mmol/L (ref 3.5–5.2)
Sodium: 140 mmol/L (ref 134–144)
TOTAL PROTEIN: 7.6 g/dL (ref 6.0–8.5)

## 2016-04-23 LAB — CBC WITH DIFFERENTIAL/PLATELET
BASOS ABS: 0.1 10*3/uL (ref 0.0–0.2)
Basos: 1 %
EOS (ABSOLUTE): 0.3 10*3/uL (ref 0.0–0.4)
Eos: 3 %
HEMOGLOBIN: 12.7 g/dL (ref 11.1–15.9)
Hematocrit: 37 % (ref 34.0–46.6)
IMMATURE GRANS (ABS): 0 10*3/uL (ref 0.0–0.1)
IMMATURE GRANULOCYTES: 0 %
LYMPHS: 32 %
Lymphocytes Absolute: 3.1 10*3/uL (ref 0.7–3.1)
MCH: 30.5 pg (ref 26.6–33.0)
MCHC: 34.3 g/dL (ref 31.5–35.7)
MCV: 89 fL (ref 79–97)
Monocytes Absolute: 0.6 10*3/uL (ref 0.1–0.9)
Monocytes: 7 %
NEUTROS ABS: 5.7 10*3/uL (ref 1.4–7.0)
NEUTROS PCT: 57 %
PLATELETS: 303 10*3/uL (ref 150–379)
RBC: 4.16 x10E6/uL (ref 3.77–5.28)
RDW: 13.6 % (ref 12.3–15.4)
WBC: 9.7 10*3/uL (ref 3.4–10.8)

## 2016-04-23 LAB — LIPID PANEL
CHOLESTEROL TOTAL: 222 mg/dL — AB (ref 100–199)
Chol/HDL Ratio: 4.4 ratio units (ref 0.0–4.4)
HDL: 50 mg/dL (ref >39)
LDL Calculated: 142 mg/dL — ABNORMAL HIGH (ref 0–99)
TRIGLYCERIDES: 152 mg/dL — AB (ref 0–149)
VLDL CHOLESTEROL CAL: 30 mg/dL (ref 5–40)

## 2016-04-23 LAB — TSH: TSH: 0.664 u[IU]/mL (ref 0.450–4.500)

## 2016-04-23 LAB — HEMOGLOBIN A1C
ESTIMATED AVERAGE GLUCOSE: 108 mg/dL
Hgb A1c MFr Bld: 5.4 % (ref 4.8–5.6)

## 2016-04-23 LAB — HEPATITIS C ANTIBODY: Hep C Virus Ab: <0.1 s/co ratio (ref 0.0–0.9)

## 2016-04-23 NOTE — Telephone Encounter (Signed)
-----   Message from Margaretann Loveless, [place] sent at 04/23/2016  1:11 PM EST ----- Cholesterol improving from last year but still borderline high. Continue healthy lifestyle modifications. All other labs are WNL.

## 2016-04-23 NOTE — Telephone Encounter (Signed)
Patient was advised as directed below.  Thanks,  -Amarisa Wilinski 

## 2016-05-08 ENCOUNTER — Telehealth: Payer: Self-pay | Admitting: Physician Assistant

## 2016-05-08 DIAGNOSIS — M545 Low back pain, unspecified: Secondary | ICD-10-CM

## 2016-05-08 DIAGNOSIS — G8929 Other chronic pain: Secondary | ICD-10-CM

## 2016-05-08 MED ORDER — HYDROCODONE-ACETAMINOPHEN 5-325 MG PO TABS: 1.0000 | ORAL_TABLET | Freq: Four times a day (QID) | ORAL | 0 refills | Status: DC | PRN

## 2016-05-08 NOTE — Telephone Encounter (Signed)
Rx printed

## 2016-05-08 NOTE — Telephone Encounter (Signed)
Patient needs refill on Norco please.

## 2016-05-15 ENCOUNTER — Telehealth: Payer: Self-pay | Admitting: Physician Assistant

## 2016-05-15 DIAGNOSIS — B379 Candidiasis, unspecified: Secondary | ICD-10-CM

## 2016-05-15 MED ORDER — FLUCONAZOLE 150 MG PO TABS: 150.0000 mg | ORAL_TABLET | Freq: Once | ORAL | 0 refills | Status: AC

## 2016-05-15 NOTE — Telephone Encounter (Signed)
Patient states she a yeast infection and would like a Diflucan called in.

## 2016-05-15 NOTE — Telephone Encounter (Signed)
Diflucan sent to [facility] [address]

## 2016-06-03 ENCOUNTER — Telehealth: Payer: Self-pay | Admitting: Family Medicine

## 2016-06-03 DIAGNOSIS — G8929 Other chronic pain: Secondary | ICD-10-CM

## 2016-06-03 DIAGNOSIS — M545 Low back pain, unspecified: Secondary | ICD-10-CM

## 2016-06-03 MED ORDER — HYDROCODONE-ACETAMINOPHEN 5-325 MG PO TABS: 1.0000 | ORAL_TABLET | Freq: Four times a day (QID) | ORAL | 0 refills | Status: DC | PRN

## 2016-06-03 NOTE — Telephone Encounter (Signed)
Norco printed 

## 2016-06-03 NOTE — Telephone Encounter (Signed)
Pt called saying she needs her headache rx refilled  HYDROcodone-acetaminophen (NORCO) 5-325 MG tablet  Thanks,  Barth Kirks

## 2016-06-24 ENCOUNTER — Other Ambulatory Visit: Payer: Self-pay | Admitting: Physician Assistant

## 2016-06-24 DIAGNOSIS — F419 Anxiety disorder, unspecified: Secondary | ICD-10-CM

## 2016-06-24 MED ORDER — CITALOPRAM HYDROBROMIDE 40 MG PO TABS: 40.0000 mg | ORAL_TABLET | Freq: Every day | ORAL | 3 refills | Status: DC

## 2016-06-24 NOTE — Telephone Encounter (Signed)
[facility] faxed a request for the following medication for patient. Thanks CC   citalopram (CELEXA) 40 MG tablet  Take one tablet by mouth once daily. Qty 90

## 2016-06-25 ENCOUNTER — Telehealth: Payer: Self-pay | Admitting: Physician Assistant

## 2016-06-25 NOTE — Telephone Encounter (Signed)
Please Review.  Thanks,  -Joseline 

## 2016-06-25 NOTE — Telephone Encounter (Signed)
Pt has been having on going headache.  She thinks allergies, and would like some one to call her back.  Has hx of migraines.  Please advise.  Thanks [employer]

## 2016-06-25 NOTE — Telephone Encounter (Signed)
Patient is currently using the Flonase and taking Claritin.  When asked about her hx of migraines and if she felt like this felt m ore like migraine or allergy she said a little of both.  She said she has been taking her Norco for the last 3 days and using 3 in a day which is more than she usually has to take according to the patient.  ED

## 2016-06-25 NOTE — Telephone Encounter (Signed)
Can we see if she is using anything for her allergies? I know she has flonase on her list.

## 2016-06-26 NOTE — Telephone Encounter (Signed)
Can we see how she is doing today. She may need to come in to discuss migraines since I dont see her having any medication for migraines.

## 2016-06-26 NOTE — Telephone Encounter (Signed)
Patient is doing a little better.  She opted to come in and be seen tomorrow.  ED

## 2016-06-27 ENCOUNTER — Encounter: Payer: Self-pay | Admitting: Physician Assistant

## 2016-06-27 ENCOUNTER — Ambulatory Visit (INDEPENDENT_AMBULATORY_CARE_PROVIDER_SITE_OTHER): Payer: 59 | Admitting: Physician Assistant

## 2016-06-27 VITALS — BP 110/70 | HR 84 | Temp 98.3°F | Resp 16 | Ht 69.5 in | Wt 246.6 lb

## 2016-06-27 DIAGNOSIS — M545 Low back pain: Secondary | ICD-10-CM

## 2016-06-27 DIAGNOSIS — G44229 Chronic tension-type headache, not intractable: Secondary | ICD-10-CM

## 2016-06-27 DIAGNOSIS — G8929 Other chronic pain: Secondary | ICD-10-CM

## 2016-06-27 MED ORDER — HYDROCODONE-ACETAMINOPHEN 5-325 MG PO TABS: 1.0000 | ORAL_TABLET | Freq: Four times a day (QID) | ORAL | 0 refills | Status: DC | PRN

## 2016-06-27 NOTE — Patient Instructions (Signed)
Sinus Headache A sinus headache occurs when the paranasal sinuses become clogged or swollen. Paranasal sinuses are air pockets within the bones of the face. Sinus headaches can range from mild to severe. What are the causes? A sinus headache can result from various conditions that affect the sinuses, such as:  Colds.  Sinus infections.  Allergies. What are the signs or symptoms? The main symptom of this condition is a headache that may feel like pain or pressure in the face, forehead, ears, or upper teeth. People who have a sinus headache often have other symptoms, such as:  Congested or runny nose.  Fever.  Inability to smell. Weather changes can make symptoms worse. How is this diagnosed? This condition may be diagnosed based on:  A physical exam and medical history.  Imaging tests, such as a CT scan and MRI, to check for problems with the sinuses.  A specialist may look into the sinuses with a tool that has a camera (endoscopy). How is this treated? Treatment for this condition depends on the cause.  Sinus pain that is caused by a sinus infection may be treated with antibiotic medicine.  Sinus pain that is caused by allergies may be helped by allergy medicines (antihistamines) and medicated nasal sprays.  Sinus pain that is caused by congestion may be helped by flushing the nose and sinuses with saline solution. Follow these instructions at home:  Take medicines only as directed by your health care provider.  If you were prescribed an antibiotic medicine, finish all of it even if you start to feel better.  If you have congestion, use a nasal spray to help reduce pressure.  If directed, apply a warm, moist washcloth to your face to help relieve pain. Contact a health care provider if:  You have headaches more than one time each week.  You have sensitivity to light or sound.  You have a fever.  You feel sick to your stomach (nauseous) or you throw up  (vomit).  Your headaches do not get better with treatment. Many people think that they have a sinus headache when they actually have migraines or tension headaches. Get help right away if:  You have vision problems.  You have sudden, severe pain in your face or head.  You have a seizure.  You are confused.  You have a stiff neck. This information is not intended to replace advice given to you by your health care provider. Make sure you discuss any questions you have with your health care provider. Document Released: 04/11/2004 Document Revised: 10/29/2015 Document Reviewed: 02/28/2014 Elsevier Interactive Patient Education  2017 Elsevier Inc.  

## 2016-06-27 NOTE — Progress Notes (Signed)
Patient: Kristin Aguirre Female    DOB: November 29, 1960   56 y.o.   MRN: [id number] Visit Date: 06/27/2016  Today's Provider: Margaretann Loveless, PA-C   Chief Complaint  Patient presents with  . Follow-up   Subjective:    HPI  Follow up for headaches  The patient was last seen for this several months ago. Changes made at last visit include no changes.  She reports excellent compliance with treatment. She feels that condition is Worse. She is not having side effects.  Patient reports that symptoms have been worsening in the last 4 weeks. Patient reports that she has been taking Claritin daily and using Flonase, reports mild improvement to headaches. Patient has not been able to associate the cause of headaches. Patient reports that her mother had migraine. Patient reports that her headaches are worse during the day and denies headaches at night time.   She feels her sinuses are contributing to her headache at this time. She feels she is developing a sinus headache and is congested today and this is what is the trigger for her migraines. She denies having headaches even weekly prior to this. She has been using her Norco for her headaches and her knee pain. She does not wish to use any other medication at this time for her headaches. ------------------------------------------------------------------------------------    Allergies  Allergen Reactions  . Furosemide Rash     Current Outpatient Prescriptions:  .  acetaminophen (TYLENOL) 325 MG tablet, Take 650 mg by mouth every 6 (six) hours as needed., Disp: , Rfl:  .  albuterol (PROVENTIL HFA;VENTOLIN HFA) 108 (90 Base) MCG/ACT inhaler, Inhale 2 puffs into the lungs every 6 (six) hours as needed for wheezing or shortness of breath., Disp: 1 Inhaler, Rfl: 2 .  ALPRAZolam (XANAX) 0.5 MG tablet, Take 1 tablet (0.5 mg total) by mouth 2 (two) times daily as needed for anxiety., Disp: 60 tablet, Rfl: 3 .  Ascorbic Acid (VITAMIN C  PO), Take by mouth., Disp: , Rfl:  .  citalopram (CELEXA) 40 MG tablet, Take 1 tablet (40 mg total) by mouth daily., Disp: 90 tablet, Rfl: 3 .  fluticasone (FLONASE) 50 MCG/ACT nasal spray, Place 2 sprays into both nostrils daily., Disp: 16 g, Rfl: 6 .  hydrochlorothiazide (HYDRODIURIL) 25 MG tablet, Take 1 tablet (25 mg total) by mouth daily., Disp: 90 tablet, Rfl: 3 .  HYDROcodone-acetaminophen (NORCO) 5-325 MG tablet, Take 1 tablet by mouth every 6 (six) hours as needed for moderate pain., Disp: 120 tablet, Rfl: 0 .  pravastatin (PRAVACHOL) 40 MG tablet, Take 2 tablets (80 mg total) by mouth daily., Disp: 180 tablet, Rfl: 1 .  Vitamin D, Cholecalciferol, 1000 UNITS TABS, Take by mouth., Disp: , Rfl:   Review of Systems  Constitutional: Negative.   HENT: Positive for congestion, rhinorrhea, sinus pain, sinus pressure, sneezing and sore throat. Negative for ear pain.   Eyes: Positive for pain.  Respiratory: Positive for cough. Negative for chest tightness, shortness of breath and wheezing.   Cardiovascular: Negative.   Gastrointestinal: Negative.   Allergic/Immunologic: Positive for environmental allergies.  Neurological: Positive for headaches. Negative for dizziness.    Social History  Substance Use Topics  . Smoking status: Current Some Day Smoker    Packs/day: 0.25    Start date: 04/03/2012  . Smokeless tobacco: Never Used     Comment: SMokes 1 cigarettes daily  . Alcohol use 0.0 oz/week     Comment: occasional  Objective:   BP 110/70 (BP Location: Left Arm, Patient Position: Sitting, Cuff Size: Large)   Pulse 84   Temp 98.3 F (36.8 C) (Oral)   Resp 16   Ht 5' 9.5" (1.765 m)   Wt 246 lb 9.6 oz (111.9 kg)   LMP 03/18/2001 (Approximate)   SpO2 97%   BMI 35.89 kg/m  Vitals:   06/27/16 0908  BP: 110/70  Pulse: 84  Resp: 16  Temp: 98.3 F (36.8 C)  TempSrc: Oral  SpO2: 97%  Weight: 246 lb 9.6 oz (111.9 kg)  Height: 5' 9.5" (1.765 m)     Physical Exam    Constitutional: She is oriented to person, place, and time. She appears well-developed and well-nourished. No distress.  HENT:  Head: Normocephalic and atraumatic.  Right Ear: Hearing, tympanic membrane, external ear and ear canal normal.  Left Ear: Hearing, tympanic membrane, external ear and ear canal normal.  Nose: Nose normal.  Mouth/Throat: Uvula is midline, oropharynx is clear and moist and mucous membranes are normal. No oropharyngeal exudate.  Eyes: Conjunctivae are normal. Pupils are equal, round, and reactive to light. Right eye exhibits no discharge. Left eye exhibits no discharge. No scleral icterus.  Neck: Normal range of motion. Neck supple. No tracheal deviation present. No thyromegaly present.  Cardiovascular: Normal rate, regular rhythm and normal heart sounds.  Exam reveals no gallop and no friction rub.   No murmur heard. Pulmonary/Chest: Effort normal and breath sounds normal. No stridor. No respiratory distress. She has no wheezes. She has no rales.  Lymphadenopathy:    She has no cervical adenopathy.  Neurological: She is alert and oriented to person, place, and time. No cranial nerve deficit. Coordination normal.  Skin: Skin is warm and dry. She is not diaphoretic.  Vitals reviewed.      Assessment & Plan:     1. Chronic low back pain without sciatica, unspecified back pain laterality Stable.  - HYDROcodone-acetaminophen (NORCO) 5-325 MG tablet; Take 1 tablet by mouth every 6 (six) hours as needed for moderate pain.  Dispense: 120 tablet; Refill: 0  2. Chronic tension-type headache, not intractable Worsening over the last week causing her to use her Norco more frequently. Will give refill as above for one-time early fill. She is to continue flonase and claritin. Advised to add Mucinex for congestion. She is to call if no improvement or if symptoms worsen and will consider giving Zpak for infection.        Margaretann Loveless, PA-C  [facility]

## 2016-07-23 ENCOUNTER — Telehealth: Payer: Self-pay | Admitting: Physician Assistant

## 2016-07-23 DIAGNOSIS — M545 Low back pain: Principal | ICD-10-CM

## 2016-07-23 DIAGNOSIS — G8929 Other chronic pain: Secondary | ICD-10-CM

## 2016-07-23 MED ORDER — HYDROCODONE-ACETAMINOPHEN 5-325 MG PO TABS: 1.0000 | ORAL_TABLET | Freq: Four times a day (QID) | ORAL | 0 refills | Status: DC | PRN

## 2016-07-23 NOTE — Telephone Encounter (Signed)
Patients is requesting pres for norco

## 2016-07-23 NOTE — Telephone Encounter (Signed)
Norco refilled. NCSR reviewed and no red flags noted.

## 2016-08-11 ENCOUNTER — Other Ambulatory Visit: Payer: Self-pay | Admitting: Physician Assistant

## 2016-08-11 DIAGNOSIS — F419 Anxiety disorder, unspecified: Secondary | ICD-10-CM

## 2016-08-13 ENCOUNTER — Other Ambulatory Visit: Payer: Self-pay | Admitting: Physician Assistant

## 2016-08-13 DIAGNOSIS — F419 Anxiety disorder, unspecified: Secondary | ICD-10-CM

## 2016-08-13 NOTE — Telephone Encounter (Signed)
Called into [facility] [address]. Emily Drozdowski, CMA  

## 2016-08-13 NOTE — Telephone Encounter (Signed)
LOV 06/27/2016. Last refill 04/17/2016. Allene Dillon, CMA

## 2016-08-21 ENCOUNTER — Telehealth: Payer: Self-pay | Admitting: Physician Assistant

## 2016-08-21 DIAGNOSIS — M545 Low back pain, unspecified: Secondary | ICD-10-CM

## 2016-08-21 DIAGNOSIS — G8929 Other chronic pain: Secondary | ICD-10-CM

## 2016-08-21 MED ORDER — HYDROCODONE-ACETAMINOPHEN 5-325 MG PO TABS: 1.0000 | ORAL_TABLET | Freq: Four times a day (QID) | ORAL | 0 refills | Status: DC | PRN

## 2016-08-21 NOTE — Telephone Encounter (Signed)
Patient advised that prescription is placed up front ready for pick up.  Thanks,  -Joseline 

## 2016-08-21 NOTE — Telephone Encounter (Signed)
Patient needs refill on Norco. Please call pt.  When ready.

## 2016-08-21 NOTE — Telephone Encounter (Signed)
Norco Rx printed for patient. NCSR reviewed and no red flags noted.

## 2016-08-23 ENCOUNTER — Telehealth: Payer: Self-pay

## 2016-08-23 NOTE — Telephone Encounter (Signed)
Patient called saying that she has had chest discomfort on and off for about 3 weeks. She thinks it could be indigestion. She reports that she has never had GERD symptoms before, but reports that her symptoms start in the center of her chest. She reports that nothing makes her symptoms worse or feel better. She denies any shortness of breath, nausea, numbness or tingling in her extremities, or headache. She reports that her symptoms only last no more than 5 mins, then they come again. She has no more than 5 "episodes" a day.   Patient wanted to be seen sometime next week due to work schedule. Appt was scheduled for 6/13 to see [facility] for evaluation.  Advised patient that if symptoms become severe, she should go to the ER for evaluation. Patient verbally understands and agrees to plan.

## 2016-08-28 ENCOUNTER — Ambulatory Visit (INDEPENDENT_AMBULATORY_CARE_PROVIDER_SITE_OTHER): Payer: 59 | Admitting: Physician Assistant

## 2016-08-28 ENCOUNTER — Encounter: Payer: Self-pay | Admitting: Physician Assistant

## 2016-08-28 VITALS — BP 110/76 | HR 80 | Temp 97.8°F | Resp 16 | Ht 69.5 in | Wt 246.2 lb

## 2016-08-28 DIAGNOSIS — K2900 Acute gastritis without bleeding: Secondary | ICD-10-CM | POA: Diagnosis not present

## 2016-08-28 DIAGNOSIS — R079 Chest pain, unspecified: Secondary | ICD-10-CM

## 2016-08-28 DIAGNOSIS — F17200 Nicotine dependence, unspecified, uncomplicated: Secondary | ICD-10-CM

## 2016-08-28 DIAGNOSIS — Z720 Tobacco use: Secondary | ICD-10-CM

## 2016-08-28 DIAGNOSIS — Z716 Tobacco abuse counseling: Secondary | ICD-10-CM | POA: Diagnosis not present

## 2016-08-28 DIAGNOSIS — Z87891 Personal history of nicotine dependence: Secondary | ICD-10-CM

## 2016-08-28 MED ORDER — VARENICLINE TARTRATE 0.5 MG X 11 & 1 MG X 42 PO MISC: ORAL | 0 refills | Status: DC

## 2016-08-28 MED ORDER — OMEPRAZOLE 40 MG PO CPDR: 40.0000 mg | DELAYED_RELEASE_CAPSULE | Freq: Every day | ORAL | 3 refills | Status: DC

## 2016-08-28 NOTE — Progress Notes (Signed)
Patient: Kristin Aguirre Female    DOB: [birth year]   56 y.o.   MRN: [id number] Visit Date: 08/28/2016  Today's Provider: Margaretann Loveless, PA-C   Chief Complaint  Patient presents with  . Chest Pain   Subjective:    HPI Patient here today C/O of chest pain on and off times two weeks. Patient denies nausea, vomiting, or radiating pain. Patient reports taking Norco PRN and Tylenol PRN, reports mild improvement. Patient reports taking Xanax 2-3 times a day.  She does have a strong family history of CVD. She reports she has had 3 uncles pass from unexpected MIs. One passed just last week, he was 57 years old.   She is a current smoker. She is on pravastatin 80mg  daily. She is also obese with a BMI of 35. She has never had a stress test.   She is under current stress at work stating her boss yells a lot and expects things to be done immediately. She reports this is a trigger for her. She is on citalopram and xanax for depression and anxiety. She also reports that she has never had indigestion or heart burn previously. She reports she had indigestion-like symptoms only once after her anterior access for cervical spine fusion because they had pulled her esophagus to the side for access, but none since.     Allergies  Allergen Reactions  . Furosemide Rash     Current Outpatient Prescriptions:  .  acetaminophen (TYLENOL) 325 MG tablet, Take 650 mg by mouth every 6 (six) hours as needed., Disp: , Rfl:  .  albuterol (PROVENTIL HFA;VENTOLIN HFA) 108 (90 Base) MCG/ACT inhaler, Inhale 2 puffs into the lungs every 6 (six) hours as needed for wheezing or shortness of breath., Disp: 1 Inhaler, Rfl: 2 .  ALPRAZolam (XANAX) 0.5 MG tablet, TAKE ONE TABLET BY MOUTH TWICE DAILY AS NEEDED FOR ANXIETY, Disp: 60 tablet, Rfl: 5 .  Ascorbic Acid (VITAMIN C PO), Take by mouth., Disp: , Rfl:  .  citalopram (CELEXA) 40 MG tablet, Take 1 tablet (40 mg total) by mouth daily., Disp: 90 tablet, Rfl:  3 .  hydrochlorothiazide (HYDRODIURIL) 25 MG tablet, Take 1 tablet (25 mg total) by mouth daily., Disp: 90 tablet, Rfl: 3 .  HYDROcodone-acetaminophen (NORCO) 5-325 MG tablet, Take 1 tablet by mouth every 6 (six) hours as needed for moderate pain., Disp: 120 tablet, Rfl: 0 .  pravastatin (PRAVACHOL) 40 MG tablet, Take 2 tablets (80 mg total) by mouth daily., Disp: 180 tablet, Rfl: 1 .  Vitamin D, Cholecalciferol, 1000 UNITS TABS, Take by mouth., Disp: , Rfl:  .  fluticasone (FLONASE) 50 MCG/ACT nasal spray, Place 2 sprays into both nostrils daily. (Patient not taking: Reported on 08/28/2016), Disp: 16 g, Rfl: 6  Review of Systems  Constitutional: Positive for fatigue.  HENT: Negative.   Respiratory: Positive for shortness of breath (long standing and unchanged; feels from smoking and overweight). Negative for cough, chest tightness and wheezing.   Cardiovascular: Positive for chest pain. Negative for palpitations and leg swelling.  Gastrointestinal: Negative.   Neurological: Negative.   Psychiatric/Behavioral: Negative for agitation, decreased concentration, dysphoric mood, self-injury, sleep disturbance and suicidal ideas. The patient is nervous/anxious.     Social History  Substance Use Topics  . Smoking status: Current Some Day Smoker    Packs/day: 0.25    Start date: 04/03/2012  . Smokeless tobacco: Never Used     Comment: SMokes 1 cigarettes daily  .  Alcohol use 0.0 oz/week     Comment: occasional   Objective:   BP 110/76 (BP Location: Left Arm, Patient Position: Sitting, Cuff Size: Large)   Pulse 80   Temp 97.8 F (36.6 C) (Oral)   Resp 16   Ht 5' 9.5" (1.765 m)   Wt 246 lb 3.2 oz (111.7 kg)   LMP 03/18/2001 (Approximate)   SpO2 97%   BMI 35.84 kg/m  Vitals:   08/28/16 1009  BP: 110/76  Pulse: 80  Resp: 16  Temp: 97.8 F (36.6 C)  TempSrc: Oral  SpO2: 97%  Weight: 246 lb 3.2 oz (111.7 kg)  Height: 5' 9.5" (1.765 m)     Physical Exam  Constitutional: She  appears well-developed and well-nourished. No distress.  Neck: Normal range of motion. Neck supple. No JVD present. No tracheal deviation present. No thyromegaly present.  Cardiovascular: Normal rate, regular rhythm and normal heart sounds.  Exam reveals no gallop and no friction rub.   No murmur heard. Pulmonary/Chest: Effort normal. No respiratory distress. She has no decreased breath sounds. She has wheezes in the right upper field, the left upper field and the left middle field. She has no rhonchi. She has no rales. She exhibits tenderness (able to recreate a pain, not similar to what she was experiencing, when left chest wall pressed).  Abdominal: Soft. Bowel sounds are normal. She exhibits no distension and no mass. There is no hepatosplenomegaly. There is tenderness in the epigastric area. There is no rebound, no guarding and no CVA tenderness.  Musculoskeletal: She exhibits no edema.  Lymphadenopathy:    She has no cervical adenopathy.  Skin: She is not diaphoretic.  Vitals reviewed.     Assessment & Plan:     1. Chest pain, unspecified type EKG today was fairly unremarkable but possible LVH. No ST-T wave elevations or depressions. NSR Rate 66, personally reviewed by myself. Referral placed to cardiology for evaluation and consideration of stress test. Patient is a smoker, obese, and has strong family history of sudden cardiac death from MI.  - EKG 12-Lead - Ambulatory referral to Cardiology  2. Acute gastritis without hemorrhage, unspecified gastritis type Since patient did have tenderness in the epigastric region with palpation suspect possible gastritis (most likely stress induced). Will give omeprazole as below for trial to see if symptoms improve. She is to call if symptoms worsen.  - omeprazole (PRILOSEC) 40 MG capsule; Take 1 capsule (40 mg total) by mouth daily.  Dispense: 30 capsule; Refill: 3  3. Tobacco dependence Patient is interested in trying to quit smoking. Will start  chantix as below. If she does well she is to call for a continuing pak. Advised to pick a quit date 14 days after starting chantix. Advised patient of the [place]quit website.  - varenicline (CHANTIX PAK) 0.5 MG X 11 & 1 MG X 42 tablet; Take one 0.5 mg tab by mouth once daily for 3 days, then increase to one 0.5 mg tab BID for 4 days, then increase to one 1 mg tab BID  Dispense: 53 tablet; Refill: 0  4. Encounter for smoking cessation counseling See above medical treatment plan. - varenicline (CHANTIX PAK) 0.5 MG X 11 & 1 MG X 42 tablet; Take one 0.5 mg tab by mouth once daily for 3 days, then increase to one 0.5 mg tab BID for 4 days, then increase to one 1 mg tab BID  Dispense: 53 tablet; Refill: 0  Smoking cessation instruction/counseling given:  counseled patient  on the dangers of tobacco use, advised patient to stop smoking, and reviewed strategies to maximize success I spent approx 5 minutes discussing smoking cessation with the patient.        Margaretann Loveless, PA-C  [facility]

## 2016-08-28 NOTE — Patient Instructions (Signed)

## 2016-09-23 ENCOUNTER — Other Ambulatory Visit: Payer: Self-pay | Admitting: Physician Assistant

## 2016-09-23 DIAGNOSIS — G8929 Other chronic pain: Secondary | ICD-10-CM

## 2016-09-23 DIAGNOSIS — M545 Low back pain, unspecified: Secondary | ICD-10-CM

## 2016-09-23 MED ORDER — HYDROCODONE-ACETAMINOPHEN 5-325 MG PO TABS: 1.0000 | ORAL_TABLET | Freq: Four times a day (QID) | ORAL | 0 refills | Status: DC | PRN

## 2016-09-23 NOTE — Telephone Encounter (Signed)
Pt contacted office for refill request on the following medications:  HYDROcodone-acetaminophen (NORCO) 5-325 MG tablet.  HA#[phone]/BD

## 2016-09-23 NOTE — Telephone Encounter (Signed)
Rx printed. NCCSR reviewed. 

## 2016-10-24 ENCOUNTER — Other Ambulatory Visit: Payer: Self-pay | Admitting: Emergency Medicine

## 2016-10-24 DIAGNOSIS — G8929 Other chronic pain: Secondary | ICD-10-CM

## 2016-10-24 DIAGNOSIS — M545 Low back pain, unspecified: Secondary | ICD-10-CM

## 2016-10-25 MED ORDER — HYDROCODONE-ACETAMINOPHEN 5-325 MG PO TABS
1.0000 | ORAL_TABLET | Freq: Four times a day (QID) | ORAL | 0 refills | Status: DC | PRN
Start: 2016-10-25 — End: 2016-11-21

## 2016-10-25 NOTE — Telephone Encounter (Signed)
Rx printed. NCCSR reviewed per state guidelines. No red flags noted.

## 2016-11-21 ENCOUNTER — Telehealth: Payer: Self-pay | Admitting: Physician Assistant

## 2016-11-21 DIAGNOSIS — M545 Low back pain, unspecified: Secondary | ICD-10-CM

## 2016-11-21 DIAGNOSIS — G8929 Other chronic pain: Secondary | ICD-10-CM

## 2016-11-21 MED ORDER — HYDROCODONE-ACETAMINOPHEN 5-325 MG PO TABS: 1.0000 | ORAL_TABLET | Freq: Four times a day (QID) | ORAL | 0 refills | Status: DC | PRN

## 2016-11-21 NOTE — Telephone Encounter (Signed)
Rx printed. NCCSR reviewed. 

## 2016-11-21 NOTE — Telephone Encounter (Signed)
Pt contacted office for refill request on the following medications:  HYDROcodone-acetaminophen (NORCO) 5-325 MG tablet   Last Rx: 10/25/16 LOV: 08/28/16  Please advise. Thanks TNP

## 2016-12-13 ENCOUNTER — Telehealth: Payer: Self-pay | Admitting: Physician Assistant

## 2016-12-13 DIAGNOSIS — E7800 Pure hypercholesterolemia, unspecified: Secondary | ICD-10-CM

## 2016-12-13 MED ORDER — PRAVASTATIN SODIUM 40 MG PO TABS: 80.0000 mg | ORAL_TABLET | Freq: Every day | ORAL | 1 refills | Status: DC

## 2016-12-13 NOTE — Telephone Encounter (Signed)
[facility] faxed a request for the following medication. Thanks CC  pravastatin (PRAVACHOL) 40 MG tablet  >Take 2 tablets by mouth once daily.

## 2016-12-13 NOTE — Telephone Encounter (Signed)
Pravastatin refilled.  

## 2016-12-13 NOTE — Telephone Encounter (Signed)
Last filled 10/17/15. KW

## 2016-12-20 ENCOUNTER — Other Ambulatory Visit: Payer: Self-pay | Admitting: Physician Assistant

## 2016-12-20 DIAGNOSIS — M545 Low back pain, unspecified: Secondary | ICD-10-CM

## 2016-12-20 DIAGNOSIS — G8929 Other chronic pain: Secondary | ICD-10-CM

## 2016-12-20 MED ORDER — HYDROCODONE-ACETAMINOPHEN 5-325 MG PO TABS: 1.0000 | ORAL_TABLET | Freq: Four times a day (QID) | ORAL | 0 refills | Status: DC | PRN

## 2016-12-20 NOTE — Telephone Encounter (Signed)
Pt contacted office for refill request on the following medications:  HYDROcodone-acetaminophen (NORCO) 5-325 MG tablet.  CB#[phone]/MW °

## 2017-01-17 ENCOUNTER — Telehealth: Payer: Self-pay | Admitting: Physician Assistant

## 2017-01-17 DIAGNOSIS — M545 Low back pain, unspecified: Secondary | ICD-10-CM

## 2017-01-17 DIAGNOSIS — G8929 Other chronic pain: Secondary | ICD-10-CM

## 2017-01-17 MED ORDER — HYDROCODONE-ACETAMINOPHEN 5-325 MG PO TABS: 1.0000 | ORAL_TABLET | Freq: Four times a day (QID) | ORAL | 0 refills | Status: DC | PRN

## 2017-01-17 NOTE — Telephone Encounter (Signed)
Pt advised on her voicemail-Anastasiya V Hopkins, RMA

## 2017-01-17 NOTE — Telephone Encounter (Signed)
NCCSR reviewed. Rx printed. 

## 2017-01-17 NOTE — Telephone Encounter (Signed)
Pt contacted office for refill request on the following medications:  HYDROcodone-acetaminophen (NORCO) 5-325 MG tablet  Last Rx: 12/20/16 LOV: 08/28/16  Please advise. Thanks TNP

## 2017-01-17 NOTE — Telephone Encounter (Signed)
Please Review.  Thanks,  -Joseline 

## 2017-02-04 ENCOUNTER — Emergency Department: Payer: Self-pay

## 2017-02-04 ENCOUNTER — Encounter: Payer: Self-pay | Admitting: Emergency Medicine

## 2017-02-04 ENCOUNTER — Emergency Department
Admission: EM | Admit: 2017-02-04 | Discharge: 2017-02-04 | Disposition: A | Discharge status: Home / Self Care | Payer: Self-pay | Attending: Emergency Medicine | Admitting: Emergency Medicine

## 2017-02-04 DIAGNOSIS — K219 Gastro-esophageal reflux disease without esophagitis: Secondary | ICD-10-CM

## 2017-02-04 DIAGNOSIS — F17210 Nicotine dependence, cigarettes, uncomplicated: Secondary | ICD-10-CM | POA: Insufficient documentation

## 2017-02-04 DIAGNOSIS — R079 Chest pain, unspecified: Secondary | ICD-10-CM

## 2017-02-04 DIAGNOSIS — Z79899 Other long term (current) drug therapy: Secondary | ICD-10-CM | POA: Insufficient documentation

## 2017-02-04 LAB — BASIC METABOLIC PANEL
ANION GAP: 9 (ref 5–15)
BUN: 19 mg/dL (ref 6–20)
CHLORIDE: 102 mmol/L (ref 101–111)
CO2: 26 mmol/L (ref 22–32)
Calcium: 9.7 mg/dL (ref 8.9–10.3)
Creatinine, Ser: 0.71 mg/dL (ref 0.44–1.00)
GFR calc Af Amer: >60 mL/min (ref >60)
GFR calc non Af Amer: >60 mL/min (ref >60)
GLUCOSE: 93 mg/dL (ref 65–99)
POTASSIUM: 3.6 mmol/L (ref 3.5–5.1)
Sodium: 137 mmol/L (ref 135–145)

## 2017-02-04 LAB — CBC
HEMATOCRIT: 36.4 % (ref 35.0–47.0)
HEMOGLOBIN: 12.4 g/dL (ref 12.0–16.0)
MCH: 30.4 pg (ref 26.0–34.0)
MCHC: 34 g/dL (ref 32.0–36.0)
MCV: 89.6 fL (ref 80.0–100.0)
PLATELETS: 279 10*3/uL (ref 150–440)
RBC: 4.06 MIL/uL (ref 3.80–5.20)
RDW: 12.7 % (ref 11.5–14.5)
WBC: 9.6 10*3/uL (ref 3.6–11.0)

## 2017-02-04 LAB — TROPONIN I: Troponin I: <0.03 ng/mL (ref <0.03)

## 2017-02-04 MED ORDER — RANITIDINE HCL 150 MG PO TABS: 150.0000 mg | ORAL_TABLET | Freq: Two times a day (BID) | ORAL | 0 refills | Status: DC

## 2017-02-04 MED ORDER — GI COCKTAIL ~~LOC~~
30.0000 mL | Freq: Once | ORAL | Status: AC
  Administered 2017-02-04: 30 mL via ORAL
  Filled 2017-02-04: qty 30

## 2017-02-04 NOTE — ED Triage Notes (Signed)
Patient presents to the ED with intermittent chest pain x 1 week.  Patient states chest pain has been worse this afternoon over the past hour.  Patient is tearful during triage and appears anxious.  Patient reports pain radiating into her neck and intermittent numbness in her left hand.  Patient reports loud burping and a bad taste in her mouth.  Patient reports chest pain as a "dull, sharp pain with intermittent shortness of breath."  Patient also reports intermittent nausea and dizziness.

## 2017-02-04 NOTE — ED Provider Notes (Signed)
[facility] Emergency Department Provider Note  ____________________________________________   First MD Initiated Contact with Patient 02/04/17 1929     (approximate)  I have reviewed the triage vital signs and the nursing notes.   HISTORY  Chief Complaint Chest Pain   HPI Kristin Aguirre is a 56 y.o. female anxiety, high cholesterol and a family history of heart disease was presented to the emergency department with 1 week of chest pain to the central chest rating through to her back.  She says that she has been a little bit nauseous and says that she has been belching more frequently over the past week and has a metal-like taste in her mouth.  Has been using Tums without any relief.  Says that she has felt more weak over the past 6 months but nothing acutely worse over the past week.  She denies the pain being brought on by physical activity.  Patient also reports intermittent numbness in her left hand but not at this time.  Says that she is unable to quantify the amount of time that the pain will last because sometimes it lasts for both short and long periods of time.    Past Medical History:  Diagnosis Date  . Allergy   . Anxiety   . Diffuse cystic mastopathy   . High cholesterol     Patient Active Problem List   Diagnosis Date Noted  . Left-sided chest wall pain 08/10/2015  . Allergic rhinitis 01/31/2015  . Airway hyperreactivity 01/31/2015  . Atypical chest pain 01/31/2015  . Baker's cyst of knee 01/31/2015  . Chest pressure 01/31/2015  . Chronic headache 01/31/2015  . Tobacco use 01/31/2015  . Contact with and suspected exposure to infections with predominantly sexual mode of transmission 01/31/2015  . Acid reflux 01/31/2015  . Cannot sleep 01/31/2015  . Panic attack 01/31/2015  . Postsurgical menopause 01/31/2015  . Breath shortness 01/31/2015  . Avitaminosis D 01/31/2015  . Low back pain 11/29/2014  . Anxiety, generalized  01/14/2006  . Hyperlipidemia 01/14/2006    Past Surgical History:  Procedure Laterality Date  . ABDOMINAL HYSTERECTOMY  2003  . BREAST BIOPSY Left 2011, 2015   neg  . 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  Marland Kitchen cervical spine repair     . TONSILLECTOMY      Prior to Admission medications   Medication Sig Start Date End Date Taking? Authorizing Provider  acetaminophen (TYLENOL) 325 MG tablet Take 650 mg by mouth every 6 (six) hours as needed.    [provider]  albuterol (PROVENTIL HFA;VENTOLIN HFA) 108 (90 Base) MCG/ACT inhaler Inhale 2 puffs into the lungs every 6 (six) hours as needed for wheezing or shortness of breath. 02/02/16   Trey Sailors, PA-C  ALPRAZolam Prudy Feeler) 0.5 MG tablet TAKE ONE TABLET BY MOUTH TWICE DAILY AS NEEDED FOR ANXIETY 08/13/16   Margaretann Loveless, PA-C  Ascorbic Acid (VITAMIN C PO) Take by mouth. 04/04/14   [provider]  citalopram (CELEXA) 40 MG tablet Take 1 tablet (40 mg total) by mouth daily. 06/24/16   Margaretann Loveless, PA-C  fluticasone (FLONASE) 50 MCG/ACT nasal spray Place 2 sprays into both nostrils daily. Patient not taking: Reported on 08/28/2016 02/16/16   Trey Sailors, PA-C  hydrochlorothiazide (HYDRODIURIL) 25 MG tablet Take 1 tablet (25 mg total) by mouth daily. 04/17/16   Margaretann Loveless,  PA-C  HYDROcodone-acetaminophen (NORCO) 5-325 MG tablet Take 1 tablet by mouth every 6 (six) hours as needed for moderate pain. 01/17/17   Margaretann Loveless, PA-C  omeprazole (PRILOSEC) 40 MG capsule Take 1 capsule (40 mg total) by mouth daily. 08/28/16   Margaretann Loveless, PA-C  pravastatin (PRAVACHOL) 40 MG tablet Take 2 tablets (80 mg total) by mouth daily. 12/13/16   Margaretann Loveless, PA-C  varenicline (CHANTIX PAK) 0.5 MG X 11 & 1 MG X 42 tablet Take one 0.5 mg tab by  mouth once daily for 3 days, then increase to one 0.5 mg tab BID for 4 days, then increase to one 1 mg tab BID 08/28/16   Margaretann Loveless, PA-C  Vitamin D, Cholecalciferol, 1000 UNITS TABS Take by mouth. 12/15/13   [provider]    Allergies Furosemide  Family History  Problem Relation Age of Onset  . Heart block Mother   . Cancer Mother   . Diabetes Mother   . Heart disease Mother   . Diabetes Father   . Heart disease Father   . CVA Father   . Asthma Sister   . Anemia Sister   . Seizures Brother   . Breast cancer Maternal Grandmother     Social History Social History   Tobacco Use  . Smoking status: Current Some Day Smoker    Packs/day: 0.25    Start date: 04/03/2012  . Smokeless tobacco: Never Used  . Tobacco comment: SMokes 1 cigarettes daily  Substance Use Topics  . Alcohol use: Yes    Alcohol/week: 0.0 oz    Comment: occasional  . Drug use: No    Review of Systems  Constitutional: No fever/chills Eyes: No visual changes. ENT: No sore throat. Cardiovascular: As above Respiratory: Patient has weakness with shortness of breath over the past 6 months with activity.  No acute worsening this week. Gastrointestinal: No abdominal pain.  no vomiting.  No diarrhea.  No constipation. Genitourinary: Negative for dysuria. Musculoskeletal: Negative for back pain. Skin: Negative for rash. Neurological: Negative for headaches, focal weakness or numbness.   ____________________________________________   PHYSICAL EXAM:  VITAL SIGNS: ED Triage Vitals  Enc Vitals Group     BP 02/04/17 1747 (!) 161/86     Pulse Rate 02/04/17 1747 70     Resp 02/04/17 1747 18     Temp 02/04/17 1747 98.2 F (36.8 C)     Temp Source 02/04/17 1747 Oral     SpO2 02/04/17 1747 100 %     Weight 02/04/17 1748 210 lb (95.3 kg)     Height 02/04/17 1748 5' 9.5" (1.765 m)     Head Circumference --      Peak Flow --      Pain Score 02/04/17 1747 6     Pain Loc --      Pain  Edu? --      Excl. in GC? --     Constitutional: Alert and oriented. Well appearing and in no acute distress. Eyes: Conjunctivae are normal.  Head: Atraumatic. Nose: No congestion/rhinnorhea. Mouth/Throat: Mucous membranes are moist.  No erythema to the posterior pharynx. Neck: No stridor.   Cardiovascular: Normal rate, regular rhythm. Grossly normal heart sounds.  Chest pain not reproducible to palpation. Respiratory: Normal respiratory effort.  No retractions. Lungs CTAB. Gastrointestinal: Soft and nontender. No distention. No CVA tenderness. Musculoskeletal: No lower extremity tenderness nor edema.  No joint effusions. Neurologic:  Normal speech and language. No gross focal  neurologic deficits are appreciated. Skin:  Skin is warm, dry and intact. No rash noted. Psychiatric: Mood and affect are normal. Speech and behavior are normal.  ____________________________________________   LABS (all labs ordered are listed, but only abnormal results are displayed)  Labs Reviewed  BASIC METABOLIC PANEL  CBC  TROPONIN I   ____________________________________________  EKG  ED ECG REPORT I, Arelia Longest, the attending physician, personally viewed and interpreted this ECG.   Date: 02/04/2017  EKG Time: 1743  Rate: 76  Rhythm: normal sinus rhythm  Axis: Normal  Intervals:none  ST&T Change: No ST segment elevation or depression.  No abnormal T wave inversion.  ____________________________________________  RADIOLOGY  No acute disease ____________________________________________   PROCEDURES  Procedure(s) performed: None  Procedures  Critical Care performed: No  ____________________________________________   INITIAL IMPRESSION / ASSESSMENT AND PLAN / ED COURSE  Pertinent labs & imaging results that were available during my care of the patient were reviewed by me and considered in my medical decision making (see chart for details).  Differential diagnosis  includes, but is not limited to, ACS, aortic dissection, pulmonary embolism, cardiac tamponade, pneumothorax, pneumonia, pericarditis, myocarditis, GI-related causes including esophagitis/gastritis, and musculoskeletal chest wall pain.    As part of my medical decision making, I reviewed the following data within the electronic MEDICAL RECORD NUMBER Notes from prior office visits  ----------------------------------------- 8:14 PM on 02/04/2017 -----------------------------------------  Patient's pain is relieved after GI cocktail.  Patient will be started on Zantac.  Due to patient's risk factors I will give her follow-up information with cardiology.  However, her history and physical as well as relief with these medications makes me believe that this is much more likely to be reflux.  She is understanding of the plan willing to comply.  Will be discharged home.     ____________________________________________   FINAL CLINICAL IMPRESSION(S) / ED DIAGNOSES  Chest pain.  GERD.    NEW MEDICATIONS STARTED DURING THIS VISIT:  This SmartLink is deprecated. Use AVSMEDLIST instead to display the medication list for a patient.   Note:  This document was prepared using Dragon voice recognition software and may include unintentional dictation errors.  Lawson Fiscal, MD 02/04/17 2015

## 2017-02-10 ENCOUNTER — Other Ambulatory Visit: Payer: Self-pay

## 2017-02-10 ENCOUNTER — Ambulatory Visit: Payer: Self-pay | Attending: Oncology

## 2017-02-10 ENCOUNTER — Ambulatory Visit
Admission: RE | Admit: 2017-02-10 | Discharge: 2017-02-10 | Disposition: A | Discharge status: Home / Self Care | Payer: Self-pay | Source: Ambulatory Visit | Attending: Oncology | Admitting: Oncology

## 2017-02-10 VITALS — BP 130/83 | HR 73 | Temp 98.0°F | Ht 69.0 in | Wt 245.0 lb

## 2017-02-10 DIAGNOSIS — Z0000 Encounter for general adult medical examination without abnormal findings: Secondary | ICD-10-CM

## 2017-02-10 NOTE — Progress Notes (Signed)
Letter mailed from [facility] to notify of normal mammogram results.  Patient to return in one year for annual screening.  Copy to [facility]. 

## 2017-02-10 NOTE — Progress Notes (Signed)
Subjective:     Patient ID: Kristin Aguirre, female   DOB: 1960/10/06, 56 y.o.   MRN: [id number]  HPI   Review of Systems     Objective:   Physical Exam  Pulmonary/Chest:    Benign left breast biopsy       Assessment:     56 year old patient presents for [facility] clinic visit.  Patient screened, and meets BCCCP eligibility.  Patient does not have insurance, Medicare or Medicaid.  Handout given on Affordable Care Act. Instructed patient on breast self-exam using teach back method. CBE unremarkable.  No mass or lump palpated.  Continues to complain of left breast pain at previous cyst aspiration site. Will refer back to Dr. Lemar Livings if mammogram results normal, and still has complaint.    Plan:     Sent for bilateral screening mammogram.

## 2017-02-12 ENCOUNTER — Telehealth: Payer: Self-pay | Admitting: Physician Assistant

## 2017-02-12 DIAGNOSIS — M545 Low back pain, unspecified: Secondary | ICD-10-CM

## 2017-02-12 DIAGNOSIS — G8929 Other chronic pain: Secondary | ICD-10-CM

## 2017-02-12 MED ORDER — HYDROCODONE-ACETAMINOPHEN 5-325 MG PO TABS: 1.0000 | ORAL_TABLET | Freq: Four times a day (QID) | ORAL | 0 refills | Status: DC | PRN

## 2017-02-12 NOTE — Telephone Encounter (Signed)
Pt contacted office for refill request on the following medications:  HYDROcodone-acetaminophen (NORCO) 5-325 MG tablet   Last Rx: 01/17/17 LOV: 08/28/16  Please advise. Thanks TNP

## 2017-02-12 NOTE — Telephone Encounter (Signed)
NCCSR reviewed. Rx printed. 

## 2017-03-04 ENCOUNTER — Other Ambulatory Visit: Payer: Self-pay | Admitting: Physician Assistant

## 2017-03-04 DIAGNOSIS — F419 Anxiety disorder, unspecified: Secondary | ICD-10-CM

## 2017-03-04 MED ORDER — ALPRAZOLAM 0.5 MG PO TABS: 0.5000 mg | ORAL_TABLET | Freq: Two times a day (BID) | ORAL | 5 refills | Status: DC | PRN

## 2017-03-04 NOTE — Telephone Encounter (Signed)
[facility] faxed a refill request for the following medication. Thanks CC  ALPRAZolam (XANAX) 0.5 MG tablet

## 2017-03-06 NOTE — Telephone Encounter (Signed)
Prescription phoned in to [facility]

## 2017-03-13 ENCOUNTER — Other Ambulatory Visit: Payer: Self-pay | Admitting: Physician Assistant

## 2017-03-13 DIAGNOSIS — M545 Low back pain, unspecified: Secondary | ICD-10-CM

## 2017-03-13 DIAGNOSIS — G8929 Other chronic pain: Secondary | ICD-10-CM

## 2017-03-13 MED ORDER — HYDROCODONE-ACETAMINOPHEN 5-325 MG PO TABS: 1.0000 | ORAL_TABLET | Freq: Four times a day (QID) | ORAL | 0 refills | Status: DC | PRN

## 2017-03-13 NOTE — Telephone Encounter (Signed)
Pt requesting refill of Norco 5-325

## 2017-03-13 NOTE — Telephone Encounter (Signed)
NCCSR reviewed. Rx printed. 

## 2017-03-13 NOTE — Telephone Encounter (Signed)
Patient advised as directed below. 

## 2017-04-07 ENCOUNTER — Ambulatory Visit (INDEPENDENT_AMBULATORY_CARE_PROVIDER_SITE_OTHER): Payer: Self-pay | Admitting: Physician Assistant

## 2017-04-07 ENCOUNTER — Encounter: Payer: Self-pay | Admitting: Physician Assistant

## 2017-04-07 VITALS — BP 116/68 | HR 88 | Temp 98.7°F | Wt 240.0 lb

## 2017-04-07 DIAGNOSIS — R059 Cough, unspecified: Secondary | ICD-10-CM

## 2017-04-07 DIAGNOSIS — R05 Cough: Secondary | ICD-10-CM

## 2017-04-07 DIAGNOSIS — J101 Influenza due to other identified influenza virus with other respiratory manifestations: Secondary | ICD-10-CM

## 2017-04-07 DIAGNOSIS — R52 Pain, unspecified: Secondary | ICD-10-CM

## 2017-04-07 LAB — POCT INFLUENZA A/B
Influenza A, POC: POSITIVE — AB
Influenza B, POC: NEGATIVE

## 2017-04-07 MED ORDER — OSELTAMIVIR PHOSPHATE 75 MG PO CAPS: 75.0000 mg | ORAL_CAPSULE | Freq: Two times a day (BID) | ORAL | 0 refills | Status: DC

## 2017-04-07 NOTE — Patient Instructions (Signed)

## 2017-04-07 NOTE — Progress Notes (Signed)
[facility]  Chief Complaint  Patient presents with  . URI    Started Friday    Subjective:    Patient ID: Kristin Aguirre, female    DOB: 09-03-1960, 57 y.o.   MRN: [id number]  Upper Respiratory Infection: Kristin Aguirre is a 57 y.o. female with a past medical history significant for 2 pack per month smoking history complaining of symptoms of a URI. Symptoms include bilateral ear pain, congestion and cough. Onset of symptoms was 3 days ago, gradually worsening since that time. She also c/o bilateral ear pressure/pain for the past 4 days .  She is drinking plenty of fluids. Evaluation to date: none. Treatment to date: cough suppressants and decongestants. The treatment has provided no relief.  Review of Systems  Constitutional: Positive for fatigue. Negative for activity change, appetite change, chills, diaphoresis, fever and unexpected weight change.  HENT: Positive for congestion, ear pain (Right worse than left), nosebleeds, postnasal drip, rhinorrhea and sore throat. Negative for ear discharge, sinus pressure, sinus pain, sneezing, tinnitus, trouble swallowing and voice change.   Eyes: Negative.   Respiratory: Positive for cough. Negative for apnea, choking, chest tightness, shortness of breath, wheezing and stridor.   Gastrointestinal: Negative.   Neurological: Positive for headaches. Negative for dizziness and light-headedness.       Objective:   BP 116/68 (BP Location: Right Arm, Patient Position: Sitting, Cuff Size: Large)   Pulse 88   Temp 98.7 F (37.1 C) (Oral)   Wt 240 lb (108.9 kg)   LMP 03/18/2001 (Approximate)   SpO2 97%   BMI 35.44 kg/m   Patient Active Problem List   Diagnosis Date Noted  . Left-sided chest wall pain 08/10/2015  . Allergic rhinitis 01/31/2015  . Airway hyperreactivity 01/31/2015  . Atypical chest pain 01/31/2015  . Baker's cyst of knee 01/31/2015  . Chest pressure 01/31/2015  . Chronic  headache 01/31/2015  . Tobacco use 01/31/2015  . Contact with and suspected exposure to infections with predominantly sexual mode of transmission 01/31/2015  . Acid reflux 01/31/2015  . Cannot sleep 01/31/2015  . Panic attack 01/31/2015  . Postsurgical menopause 01/31/2015  . Breath shortness 01/31/2015  . Avitaminosis D 01/31/2015  . Low back pain 11/29/2014  . Anxiety, generalized 01/14/2006  . Hyperlipidemia 01/14/2006    Outpatient Encounter Medications as of 04/07/2017  Medication Sig Note  . acetaminophen (TYLENOL) 325 MG tablet Take 650 mg by mouth every 6 (six) hours as needed.   Marland Kitchen albuterol (PROVENTIL HFA;VENTOLIN HFA) 108 (90 Base) MCG/ACT inhaler Inhale 2 puffs into the lungs every 6 (six) hours as needed for wheezing or shortness of breath.   . ALPRAZolam (XANAX) 0.5 MG tablet Take 1 tablet (0.5 mg total) by mouth 2 (two) times daily as needed. for anxiety   . Ascorbic Acid (VITAMIN C PO) Take by mouth. 01/31/2015: Received from: [facility]  . citalopram (CELEXA) 40 MG tablet Take 1 tablet (40 mg total) by mouth daily.   . fluticasone (FLONASE) 50 MCG/ACT nasal spray Place 2 sprays into both nostrils daily.   . hydrochlorothiazide (HYDRODIURIL) 25 MG tablet Take 1 tablet (25 mg total) by mouth daily.   Marland Kitchen HYDROcodone-acetaminophen (NORCO) 5-325 MG tablet Take 1 tablet by mouth every 6 (six) hours as needed for moderate pain.   Marland Kitchen omeprazole (PRILOSEC) 40 MG capsule Take 1 capsule (40 mg total) by mouth daily.   . pravastatin (PRAVACHOL) 40 MG tablet Take 2 tablets (80 mg total)  by mouth daily.   . ranitidine (ZANTAC) 150 MG tablet Take 1 tablet (150 mg total) by mouth 2 (two) times daily.   . Vitamin D, Cholecalciferol, 1000 UNITS TABS Take by mouth. 01/31/2015: Received from: [facility]  . varenicline (CHANTIX PAK) 0.5 MG X 11 & 1 MG X 42 tablet Take one 0.5 mg tab by mouth once daily for 3 days, then increase to one 0.5 mg tab BID for 4  days, then increase to one 1 mg tab BID (Patient not taking: Reported on 04/07/2017)    No facility-administered encounter medications on file as of 04/07/2017.     Allergies  Allergen Reactions  . Furosemide Rash       Physical Exam  Constitutional: She is oriented to person, place, and time. She appears well-developed and well-nourished.  HENT:  Right Ear: External ear normal.  Left Ear: External ear normal.  Mouth/Throat: Oropharynx is clear and moist. No oropharyngeal exudate.  Eyes: Right eye exhibits discharge. Left eye exhibits discharge.  Neck: Neck supple.  Cardiovascular: Normal rate and regular rhythm.  Pulmonary/Chest: Effort normal and breath sounds normal. No respiratory distress. She has no wheezes. She has no rales.  Lymphadenopathy:    She has no cervical adenopathy.  Neurological: She is alert and oriented to person, place, and time.  Skin: Skin is warm and dry.  Psychiatric: She has a normal mood and affect. Her behavior is normal.       Assessment & Plan:  1. Influenza A  Rapid flu positive for flu A. Counseled on course of flu. Advised to stay home and avoid contact with people for next couple of days.   - oseltamivir (TAMIFLU) 75 MG capsule; Take 1 capsule (75 mg total) by mouth 2 (two) times daily for 5 days.  Dispense: 10 capsule; Refill: 0 - POCT Influenza A/B  2. Cough   3. Body aches   The entirety of the information documented in the History of Present Illness, Review of Systems and Physical Exam were personally obtained by me. Portions of this information were initially documented by Kavin Leech, CMA and reviewed by me for thoroughness and accuracy.   Return if symptoms worsen or fail to improve.

## 2017-04-11 ENCOUNTER — Encounter: Payer: Self-pay | Admitting: Physician Assistant

## 2017-04-11 ENCOUNTER — Ambulatory Visit (INDEPENDENT_AMBULATORY_CARE_PROVIDER_SITE_OTHER): Payer: Self-pay | Admitting: Physician Assistant

## 2017-04-11 VITALS — HR 106 | Temp 99.6°F | Resp 20 | Wt 240.0 lb

## 2017-04-11 DIAGNOSIS — K219 Gastro-esophageal reflux disease without esophagitis: Secondary | ICD-10-CM

## 2017-04-11 DIAGNOSIS — M545 Low back pain: Secondary | ICD-10-CM

## 2017-04-11 DIAGNOSIS — G8929 Other chronic pain: Secondary | ICD-10-CM

## 2017-04-11 DIAGNOSIS — M170 Bilateral primary osteoarthritis of knee: Secondary | ICD-10-CM | POA: Insufficient documentation

## 2017-04-11 MED ORDER — MELOXICAM 15 MG PO TABS: 15.0000 mg | ORAL_TABLET | Freq: Every day | ORAL | 1 refills | Status: DC

## 2017-04-11 MED ORDER — HYDROCODONE-ACETAMINOPHEN 5-325 MG PO TABS: 1.0000 | ORAL_TABLET | Freq: Four times a day (QID) | ORAL | 0 refills | Status: DC | PRN

## 2017-04-11 MED ORDER — RANITIDINE HCL 150 MG PO TABS: 150.0000 mg | ORAL_TABLET | Freq: Two times a day (BID) | ORAL | 5 refills | Status: DC

## 2017-04-11 NOTE — Patient Instructions (Signed)
Meloxicam tablets What is this medicine? MELOXICAM (mel OX i cam) is a non-steroidal anti-inflammatory drug (NSAID). It is used to reduce swelling and to treat pain. It may be used for osteoarthritis, rheumatoid arthritis, or juvenile rheumatoid arthritis. This medicine may be used for other purposes; ask your health care provider or pharmacist if you have questions. COMMON BRAND NAME(S): Mobic What should I tell my health care provider before I take this medicine? They need to know if you have any of these conditions: -bleeding disorders -cigarette smoker -coronary artery bypass graft (CABG) surgery within the past 2 weeks -drink more than 3 alcohol-containing drinks per day -heart disease -high blood pressure -history of stomach bleeding -kidney disease -liver disease -lung or breathing disease, like asthma -stomach or intestine problems -an unusual or allergic reaction to meloxicam, aspirin, other NSAIDs, other medicines, foods, dyes, or preservatives -pregnant or trying to get pregnant -breast-feeding How should I use this medicine? Take this medicine by mouth with a full glass of water. Follow the directions on the prescription label. You can take it with or without food. If it upsets your stomach, take it with food. Take your medicine at regular intervals. Do not take it more often than directed. Do not stop taking except on your doctor's advice. A special MedGuide will be given to you by the pharmacist with each prescription and refill. Be sure to read this information carefully each time. Talk to your pediatrician regarding the use of this medicine in children. While this drug may be prescribed for selected conditions, precautions do apply. Patients over 65 years old may have a stronger reaction and need a smaller dose. Overdosage: If you think you have taken too much of this medicine contact a poison control center or emergency room at once. NOTE: This medicine is only for you. Do  not share this medicine with others. What if I miss a dose? If you miss a dose, take it as soon as you can. If it is almost time for your next dose, take only that dose. Do not take double or extra doses. What may interact with this medicine? Do not take this medicine with any of the following medications: -cidofovir -ketorolac This medicine may also interact with the following medications: -aspirin and aspirin-like medicines -certain medicines for blood pressure, heart disease, irregular heart beat -certain medicines for depression, anxiety, or psychotic disturbances -certain medicines that treat or prevent blood clots like warfarin, enoxaparin, dalteparin, apixaban, dabigatran, rivaroxaban -cyclosporine -digoxin -diuretics -methotrexate -other NSAIDs, medicines for pain and inflammation, like ibuprofen and naproxen -pemetrexed This list may not describe all possible interactions. Give your health care provider a list of all the medicines, herbs, non-prescription drugs, or dietary supplements you use. Also tell them if you smoke, drink alcohol, or use illegal drugs. Some items may interact with your medicine. What should I watch for while using this medicine? Tell your doctor or healthcare professional if your symptoms do not start to get better or if they get worse. Do not take other medicines that contain aspirin, ibuprofen, or naproxen with this medicine. Side effects such as stomach upset, nausea, or ulcers may be more likely to occur. Many medicines available without a prescription should not be taken with this medicine. This medicine can cause ulcers and bleeding in the stomach and intestines at any time during treatment. This can happen with no warning and may cause death. There is increased risk with taking this medicine for a long time. Smoking, drinking alcohol, older age,   and poor health can also increase risks. Call your doctor right away if you have stomach pain or blood in your  vomit or stool. This medicine does not prevent heart attack or stroke. In fact, this medicine may increase the chance of a heart attack or stroke. The chance may increase with longer use of this medicine and in people who have heart disease. If you take aspirin to prevent heart attack or stroke, talk with your doctor or health care professional. What side effects may I notice from receiving this medicine? Side effects that you should report to your doctor or health care professional as soon as possible: -allergic reactions like skin rash, itching or hives, swelling of the face, lips, or tongue -nausea, vomiting -signs and symptoms of a blood clot such as breathing problems; changes in vision; chest pain; severe, sudden headache; pain, swelling, warmth in the leg; trouble speaking; sudden numbness or weakness of the face, arm, or leg -signs and symptoms of bleeding such as bloody or black, tarry stools; red or dark-brown urine; spitting up blood or brown material that looks like coffee grounds; red spots on the skin; unusual bruising or bleeding from the eye, gums, or nose -signs and symptoms of liver injury like dark yellow or brown urine; general ill feeling or flu-like symptoms; light-colored stools; loss of appetite; nausea; right upper belly pain; unusually weak or tired; yellowing of the eyes or skin -signs and symptoms of stroke like changes in vision; confusion; trouble speaking or understanding; severe headaches; sudden numbness or weakness of the face, arm, or leg; trouble walking; dizziness; loss of balance or coordination Side effects that usually do not require medical attention (report to your doctor or health care professional if they continue or are bothersome): -constipation -diarrhea -gas This list may not describe all possible side effects. Call your doctor for medical advice about side effects. You may report side effects to FDA at 1-800-FDA-1088. Where should I keep my  medicine? Keep out of the reach of children. Store at room temperature between 15 and 30 degrees C (59 and 86 degrees F). Throw away any unused medicine after the expiration date. NOTE: This sheet is a summary. It may not cover all possible information. If you have questions about this medicine, talk to your doctor, pharmacist, or health care provider.  2018 Elsevier/Gold Standard (2015-04-05 19:28:16)  

## 2017-04-11 NOTE — Progress Notes (Signed)
Patient: Kristin Aguirre Female    DOB: [birth year]   57 y.o.   MRN: [id number] Visit Date: 04/11/2017  Today's Provider: Margaretann Loveless, PA-C   Chief Complaint  Patient presents with  . needs meds refilled   Subjective:    HPI Patient comes in today requesting a refill on her pain medication. She reports having worsening bilateral knee pain due to torn meniscus in each, worsening low back pain and also uses intermittently for her migraines. She is wanting to make sure the tylenol amount is not too much on a daily basis. She is not currently taking anything for inflammation.   She also wanted a refill on ranitidine 150mg  daily. She reports that this helps with her GERD symptoms.     Allergies  Allergen Reactions  . Furosemide Rash     Current Outpatient Medications:  .  acetaminophen (TYLENOL) 325 MG tablet, Take 650 mg by mouth every 6 (six) hours as needed., Disp: , Rfl:  .  albuterol (PROVENTIL HFA;VENTOLIN HFA) 108 (90 Base) MCG/ACT inhaler, Inhale 2 puffs into the lungs every 6 (six) hours as needed for wheezing or shortness of breath., Disp: 1 Inhaler, Rfl: 2 .  ALPRAZolam (XANAX) 0.5 MG tablet, Take 1 tablet (0.5 mg total) by mouth 2 (two) times daily as needed. for anxiety, Disp: 60 tablet, Rfl: 5 .  Ascorbic Acid (VITAMIN C PO), Take by mouth., Disp: , Rfl:  .  citalopram (CELEXA) 40 MG tablet, Take 1 tablet (40 mg total) by mouth daily., Disp: 90 tablet, Rfl: 3 .  fluticasone (FLONASE) 50 MCG/ACT nasal spray, Place 2 sprays into both nostrils daily., Disp: 16 g, Rfl: 6 .  hydrochlorothiazide (HYDRODIURIL) 25 MG tablet, Take 1 tablet (25 mg total) by mouth daily., Disp: 90 tablet, Rfl: 3 .  omeprazole (PRILOSEC) 40 MG capsule, Take 1 capsule (40 mg total) by mouth daily., Disp: 30 capsule, Rfl: 3 .  pravastatin (PRAVACHOL) 40 MG tablet, Take 2 tablets (80 mg total) by mouth daily., Disp: 180 tablet, Rfl: 1 .  ranitidine (ZANTAC) 150 MG tablet, Take 1 tablet  (150 mg total) by mouth 2 (two) times daily., Disp: 60 tablet, Rfl: 0 .  Vitamin D, Cholecalciferol, 1000 UNITS TABS, Take by mouth., Disp: , Rfl:  .  HYDROcodone-acetaminophen (NORCO) 5-325 MG tablet, Take 1 tablet by mouth every 6 (six) hours as needed for moderate pain., Disp: 120 tablet, Rfl: 0  Review of Systems  Constitutional: Positive for activity change, appetite change, chills, fatigue and fever.  HENT: Positive for congestion.   Respiratory: Positive for cough.   Cardiovascular: Negative for chest pain, palpitations and leg swelling.  Musculoskeletal: Positive for myalgias.  Neurological: Positive for weakness, light-headedness and headaches.    Social History   Tobacco Use  . Smoking status: Current Some Day Smoker    Packs/day: 0.25    Start date: 04/03/2012  . Smokeless tobacco: Never Used  . Tobacco comment: SMokes 1 cigarettes daily  Substance Use Topics  . Alcohol use: Yes    Alcohol/week: 0.0 oz    Comment: occasional   Objective:   Pulse (!) 106   Temp 99.6 F (37.6 C)   Resp 20   Wt 240 lb (108.9 kg)   LMP 03/18/2001 (Approximate)   SpO2 94%   BMI 35.44 kg/m  Vitals:   04/11/17 0947  Pulse: (!) 106  Resp: 20  Temp: 99.6 F (37.6 C)  SpO2: 94%  Weight: 240 lb (  108.9 kg)     Physical Exam  Constitutional: She appears well-developed and well-nourished. No distress.  Neck: Normal range of motion. Neck supple.  Cardiovascular: Normal rate, regular rhythm and normal heart sounds. Exam reveals no gallop and no friction rub.  No murmur heard. Pulmonary/Chest: Effort normal and breath sounds normal. No respiratory distress. She has no wheezes. She has no rales.  Skin: She is not diaphoretic.  Vitals reviewed.      Assessment & Plan:     1. Gastroesophageal reflux disease without esophagitis Stable. Diagnosis pulled for medication refill. Continue current medical treatment plan. - ranitidine (ZANTAC) 150 MG tablet; Take 1 tablet (150 mg total) by  mouth 2 (two) times daily.  Dispense: 60 tablet; Refill: 5  2. Primary osteoarthritis of both knees Patient is not taking anything for inflammation previously besides intermittent IBU and is having worsening knee pain making her take more of her pain medication (Norco). I will start meloxicam as below. She is to call if no improvements or still taking a large amount of her pain medication. - meloxicam (MOBIC) 15 MG tablet; Take 1 tablet (15 mg total) by mouth daily.  Dispense: 90 tablet; Refill: 1  3. Chronic low back pain without sciatica, unspecified back pain laterality Stable. Diagnosis pulled for medication refill. Continue current medical treatment plan. - HYDROcodone-acetaminophen (NORCO) 5-325 MG tablet; Take 1 tablet by mouth every 6 (six) hours as needed for moderate pain.  Dispense: 120 tablet; Refill: 0       Margaretann Loveless, PA-C  [facility]

## 2017-04-14 ENCOUNTER — Telehealth: Payer: Self-pay

## 2017-04-14 DIAGNOSIS — R059 Cough, unspecified: Secondary | ICD-10-CM

## 2017-04-14 DIAGNOSIS — R05 Cough: Secondary | ICD-10-CM

## 2017-04-14 MED ORDER — PSEUDOEPH-BROMPHEN-DM 30-2-10 MG/5ML PO SYRP: 5.0000 mL | ORAL_SOLUTION | Freq: Three times a day (TID) | ORAL | 0 refills | Status: DC | PRN

## 2017-04-14 NOTE — Telephone Encounter (Signed)
Sent in Bromfed-DM to [facility] [address] [place]. Patient can't have any narcotic cough syrup due to already being on hydrocodone daily

## 2017-04-14 NOTE — Telephone Encounter (Signed)
Patient is requesting a RX for a cough that started yesterday. She states she was diagnosed with the flu last week. She reports feeling better, but the cough was worse last night and she was unable to sleep.  MV#[phone] Pharmacy- [facility]

## 2017-04-14 NOTE — Telephone Encounter (Signed)
Advised patient as below.  

## 2017-05-08 ENCOUNTER — Other Ambulatory Visit: Payer: Self-pay | Admitting: Physician Assistant

## 2017-05-08 DIAGNOSIS — M545 Low back pain, unspecified: Secondary | ICD-10-CM

## 2017-05-08 DIAGNOSIS — G8929 Other chronic pain: Secondary | ICD-10-CM

## 2017-05-08 MED ORDER — HYDROCODONE-ACETAMINOPHEN 5-325 MG PO TABS: 1.0000 | ORAL_TABLET | Freq: Four times a day (QID) | ORAL | 0 refills | Status: DC | PRN

## 2017-05-08 NOTE — Telephone Encounter (Signed)
LM that prescription is placed up front ready for pick up.  Thanks,  -Joseline 

## 2017-05-08 NOTE — Telephone Encounter (Signed)
Please Review.  Thanks,  -Kenya Shiraishi 

## 2017-05-08 NOTE — Telephone Encounter (Signed)
Can't fax narcotics will have to pick up.

## 2017-05-08 NOTE — Telephone Encounter (Signed)
Patient would like her Hydrocodone 5 -325 mg. faxed into [facility] [address]. If that is possible.

## 2017-06-04 ENCOUNTER — Other Ambulatory Visit: Payer: Self-pay | Admitting: Physician Assistant

## 2017-06-04 DIAGNOSIS — M545 Low back pain, unspecified: Secondary | ICD-10-CM

## 2017-06-04 DIAGNOSIS — G8929 Other chronic pain: Secondary | ICD-10-CM

## 2017-06-04 MED ORDER — HYDROCODONE-ACETAMINOPHEN 5-325 MG PO TABS
1.0000 | ORAL_TABLET | Freq: Four times a day (QID) | ORAL | 0 refills | Status: DC | PRN
Start: 2017-06-04 — End: 2017-07-02

## 2017-06-04 NOTE — Telephone Encounter (Signed)
NCCSR reviewed. Rx sent in. 

## 2017-06-04 NOTE — Telephone Encounter (Signed)
Patient is requesting a refill on the following medication  HYDROcodone-acetaminophen (NORCO) 5-325 MG tablet  She uses [facility]

## 2017-07-02 ENCOUNTER — Other Ambulatory Visit: Payer: Self-pay | Admitting: Physician Assistant

## 2017-07-02 DIAGNOSIS — M545 Low back pain, unspecified: Secondary | ICD-10-CM

## 2017-07-02 DIAGNOSIS — G8929 Other chronic pain: Secondary | ICD-10-CM

## 2017-07-02 MED ORDER — HYDROCODONE-ACETAMINOPHEN 5-325 MG PO TABS: 1.0000 | ORAL_TABLET | Freq: Four times a day (QID) | ORAL | 0 refills | Status: DC | PRN

## 2017-07-02 NOTE — Telephone Encounter (Signed)
NCCSR reviewed. Rx sent in. 

## 2017-07-02 NOTE — Telephone Encounter (Signed)
Pt needs refill on her Norco  She uses [facility],  Barth Kirks

## 2017-07-10 ENCOUNTER — Other Ambulatory Visit: Payer: Self-pay | Admitting: Physician Assistant

## 2017-07-10 DIAGNOSIS — F419 Anxiety disorder, unspecified: Secondary | ICD-10-CM

## 2017-07-30 ENCOUNTER — Telehealth: Payer: Self-pay | Admitting: Physician Assistant

## 2017-07-30 ENCOUNTER — Other Ambulatory Visit: Payer: Self-pay

## 2017-07-30 DIAGNOSIS — M545 Low back pain, unspecified: Secondary | ICD-10-CM

## 2017-07-30 DIAGNOSIS — G8929 Other chronic pain: Secondary | ICD-10-CM

## 2017-07-30 MED ORDER — HYDROCODONE-ACETAMINOPHEN 5-325 MG PO TABS
1.0000 | ORAL_TABLET | Freq: Four times a day (QID) | ORAL | 0 refills | Status: DC | PRN
Start: 2017-07-30 — End: 2017-07-30

## 2017-07-30 MED ORDER — HYDROCODONE-ACETAMINOPHEN 5-325 MG PO TABS: 1.0000 | ORAL_TABLET | Freq: Four times a day (QID) | ORAL | 0 refills | Status: DC | PRN

## 2017-07-30 NOTE — Telephone Encounter (Signed)
Pt called this morning for her Norco and it was sent to [facility] and should have been sent to [facility].  She needs it sent to [facility].  [facility] told [facility] she would not be picking it up  Pt's call back is [phone]  Thanks teri

## 2017-07-30 NOTE — Telephone Encounter (Signed)
Oh I am so sorry for the error. Can we call [facility] to cancel that Rx for her and I will resend to [facility].

## 2017-07-30 NOTE — Telephone Encounter (Signed)
NCCSR reviewed. Rx sent in. 

## 2017-07-30 NOTE — Telephone Encounter (Signed)
Patient called requesting refill on medication. 

## 2017-08-05 ENCOUNTER — Other Ambulatory Visit: Payer: Self-pay | Admitting: Physician Assistant

## 2017-08-05 DIAGNOSIS — F419 Anxiety disorder, unspecified: Secondary | ICD-10-CM

## 2017-08-28 ENCOUNTER — Other Ambulatory Visit: Payer: Self-pay | Admitting: Physician Assistant

## 2017-08-28 ENCOUNTER — Other Ambulatory Visit: Payer: Self-pay

## 2017-08-28 DIAGNOSIS — M545 Low back pain, unspecified: Secondary | ICD-10-CM

## 2017-08-28 DIAGNOSIS — G8929 Other chronic pain: Secondary | ICD-10-CM

## 2017-08-28 MED ORDER — HYDROCODONE-ACETAMINOPHEN 5-325 MG PO TABS: 1.0000 | ORAL_TABLET | Freq: Four times a day (QID) | ORAL | 0 refills | Status: DC | PRN

## 2017-08-28 MED ORDER — HYDROCODONE-ACETAMINOPHEN 5-325 MG PO TABS
1.0000 | ORAL_TABLET | Freq: Four times a day (QID) | ORAL | 0 refills | Status: DC | PRN
Start: 2017-08-28 — End: 2017-09-29

## 2017-08-28 NOTE — Telephone Encounter (Signed)
Called [facility] and canceled the prescription for the following medication:HYDROcodone-acetaminophen (NORCO) 5-325 MG tablet

## 2017-08-28 NOTE — Telephone Encounter (Signed)
NCCSR reviewed. 

## 2017-08-28 NOTE — Telephone Encounter (Signed)
[facility] called saying pt was in looking for their Rx for hydrocodone 5-325  It apparently was sent to [facility] by mistake  Please resend to [facility] [employer]  .

## 2017-09-29 ENCOUNTER — Other Ambulatory Visit: Payer: Self-pay | Admitting: Physician Assistant

## 2017-09-29 DIAGNOSIS — M545 Low back pain, unspecified: Secondary | ICD-10-CM

## 2017-09-29 DIAGNOSIS — G8929 Other chronic pain: Secondary | ICD-10-CM

## 2017-09-29 MED ORDER — HYDROCODONE-ACETAMINOPHEN 5-325 MG PO TABS
1.0000 | ORAL_TABLET | Freq: Four times a day (QID) | ORAL | 0 refills | Status: DC | PRN
Start: 2017-09-29 — End: 2017-10-28

## 2017-09-29 NOTE — Telephone Encounter (Signed)
Needs refill on her Norco  Kristin Aguirre

## 2017-09-29 NOTE — Telephone Encounter (Signed)
NCCSR reviewed. 

## 2017-10-28 ENCOUNTER — Other Ambulatory Visit: Payer: Self-pay | Admitting: Physician Assistant

## 2017-10-28 DIAGNOSIS — M545 Low back pain: Principal | ICD-10-CM

## 2017-10-28 DIAGNOSIS — G8929 Other chronic pain: Secondary | ICD-10-CM

## 2017-10-28 MED ORDER — HYDROCODONE-ACETAMINOPHEN 5-325 MG PO TABS
1.0000 | ORAL_TABLET | Freq: Four times a day (QID) | ORAL | 0 refills | Status: DC | PRN
Start: 2017-10-28 — End: 2017-10-29

## 2017-10-28 NOTE — Telephone Encounter (Signed)
Pt needs a refill on   Norco 5-325  She uses [facility] in [place]  CB# [phone]  Thanks Barth Kirks

## 2017-10-29 ENCOUNTER — Telehealth: Payer: Self-pay

## 2017-10-29 DIAGNOSIS — G8929 Other chronic pain: Secondary | ICD-10-CM

## 2017-10-29 DIAGNOSIS — M545 Low back pain: Principal | ICD-10-CM

## 2017-10-29 MED ORDER — HYDROCODONE-ACETAMINOPHEN 5-325 MG PO TABS: 1.0000 | ORAL_TABLET | Freq: Four times a day (QID) | ORAL | 0 refills | Status: DC | PRN

## 2017-10-29 NOTE — Telephone Encounter (Signed)
Per patient prescription for [facility] was sent to the wrong pharmacy. She is asking if we can send it to [facility] Please.  Thanks,  -Joseline

## 2017-10-29 NOTE — Telephone Encounter (Signed)
Im sorry I thought I had changed the pharmacy yesterday but it must not have taken it.   Sent to [facility]

## 2017-11-26 ENCOUNTER — Other Ambulatory Visit: Payer: Self-pay | Admitting: Physician Assistant

## 2017-11-26 DIAGNOSIS — M545 Low back pain: Principal | ICD-10-CM

## 2017-11-26 DIAGNOSIS — G8929 Other chronic pain: Secondary | ICD-10-CM

## 2017-11-26 MED ORDER — HYDROCODONE-ACETAMINOPHEN 5-325 MG PO TABS: 1.0000 | ORAL_TABLET | Freq: Four times a day (QID) | ORAL | 0 refills | Status: DC | PRN

## 2017-11-26 NOTE — Telephone Encounter (Signed)
Pt contacted office for refill request on the following medications:  HYDROcodone-acetaminophen (NORCO) 5-325 MG tablet  [facility]  Last Rx: 10/29/17 LOV: 04/11/17 Please advise. Thanks TNP

## 2017-11-26 NOTE — Telephone Encounter (Signed)
NCCSR reviewed. 

## 2017-12-24 ENCOUNTER — Other Ambulatory Visit: Payer: Self-pay | Admitting: Physician Assistant

## 2017-12-24 ENCOUNTER — Telehealth: Payer: Self-pay | Admitting: Physician Assistant

## 2017-12-24 DIAGNOSIS — G8929 Other chronic pain: Secondary | ICD-10-CM

## 2017-12-24 DIAGNOSIS — E7800 Pure hypercholesterolemia, unspecified: Secondary | ICD-10-CM

## 2017-12-24 DIAGNOSIS — M545 Low back pain, unspecified: Secondary | ICD-10-CM

## 2017-12-24 MED ORDER — HYDROCODONE-ACETAMINOPHEN 5-325 MG PO TABS: 1.0000 | ORAL_TABLET | Freq: Four times a day (QID) | ORAL | 0 refills | Status: DC | PRN

## 2017-12-24 NOTE — Telephone Encounter (Signed)
Pt contacted office for refill request on the following medications:  HYDROcodone-acetaminophen (NORCO) 5-325 MG tablet  [facility]  Last Rx: 11/26/17 LOV: 04/11/17 Please advise. Thanks TNP

## 2017-12-24 NOTE — Telephone Encounter (Signed)
NCCSR reviewed. 

## 2017-12-24 NOTE — Telephone Encounter (Signed)
[facility] into [facility] [address].  Patient specifically said to call into [facility].  Please correct this.  Thanks!

## 2017-12-24 NOTE — Telephone Encounter (Signed)
Pt called wanting her HYDROcodone-acetaminophen (NORCO) 5-325 MG tablet sent to   [facility] [place] [place] - [address] [phone] (Phone) [phone] (Fax)   Pt's medication was sent to [facility].  Pt no longer uses [facility].  Thanks, [employer]

## 2018-01-21 ENCOUNTER — Other Ambulatory Visit: Payer: Self-pay | Admitting: Physician Assistant

## 2018-01-21 DIAGNOSIS — M545 Low back pain, unspecified: Secondary | ICD-10-CM

## 2018-01-21 DIAGNOSIS — G8929 Other chronic pain: Secondary | ICD-10-CM

## 2018-01-21 MED ORDER — HYDROCODONE-ACETAMINOPHEN 5-325 MG PO TABS: 1.0000 | ORAL_TABLET | Freq: Four times a day (QID) | ORAL | 0 refills | Status: DC | PRN

## 2018-01-21 NOTE — Telephone Encounter (Signed)
Pt needing a refill on:  HYDROcodone-acetaminophen (NORCO) 5-325 MG tablet   Please fill at:  [facility], [place] - [address] [phone] (Phone) [phone] (Fax)    Thanks, [employer]

## 2018-02-06 ENCOUNTER — Other Ambulatory Visit: Payer: Self-pay | Admitting: Physician Assistant

## 2018-02-06 DIAGNOSIS — F419 Anxiety disorder, unspecified: Secondary | ICD-10-CM

## 2018-02-17 ENCOUNTER — Telehealth: Payer: Self-pay | Admitting: Physician Assistant

## 2018-02-17 DIAGNOSIS — G8929 Other chronic pain: Secondary | ICD-10-CM

## 2018-02-17 DIAGNOSIS — M545 Low back pain, unspecified: Secondary | ICD-10-CM

## 2018-02-17 NOTE — Telephone Encounter (Signed)
Pt requesting refill of HYDROcodone-acetaminophen (NORCO) 5-325 MG tablet sent to [facility] in [place]

## 2018-02-18 MED ORDER — HYDROCODONE-ACETAMINOPHEN 5-325 MG PO TABS: 1.0000 | ORAL_TABLET | Freq: Four times a day (QID) | ORAL | 0 refills | Status: DC | PRN

## 2018-02-18 NOTE — Telephone Encounter (Signed)
refilled 

## 2018-03-09 ENCOUNTER — Ambulatory Visit: Payer: Self-pay

## 2018-03-19 ENCOUNTER — Encounter: Payer: Self-pay | Admitting: Physician Assistant

## 2018-03-19 ENCOUNTER — Ambulatory Visit: Payer: Self-pay | Admitting: Physician Assistant

## 2018-03-19 VITALS — BP 126/80 | HR 67 | Temp 98.3°F | Resp 16 | Wt 253.8 lb

## 2018-03-19 DIAGNOSIS — R05 Cough: Secondary | ICD-10-CM

## 2018-03-19 DIAGNOSIS — J40 Bronchitis, not specified as acute or chronic: Secondary | ICD-10-CM

## 2018-03-19 DIAGNOSIS — R059 Cough, unspecified: Secondary | ICD-10-CM

## 2018-03-19 MED ORDER — PREDNISONE 10 MG (21) PO TBPK: ORAL_TABLET | ORAL | 0 refills | Status: DC

## 2018-03-19 MED ORDER — ALBUTEROL SULFATE HFA 108 (90 BASE) MCG/ACT IN AERS: 2.0000 | INHALATION_SPRAY | Freq: Four times a day (QID) | RESPIRATORY_TRACT | 2 refills | Status: DC | PRN

## 2018-03-19 NOTE — Progress Notes (Signed)
Patient: Kristin Aguirre Female    DOB: 1961-01-28   58 y.o.   MRN: [id number] Visit Date: 03/27/2018  Today's Provider: Trey Sailors, PA-C   Chief Complaint  Patient presents with  . URI   Subjective:     HPI Upper Respiratory Infection: Patient complains of symptoms of a URI, possible sinusitis. Symptoms include cough and diarrhea. Onset of symptoms was 2 weeks ago, gradually worsening since that time. She also c/o productive cough with  green colored sputum and sore throat for the past 1 week .  She is drinking plenty of fluids. Evaluation to date: none. Treatment to date: cough suppressants and decongestants. Mucinex D, Tylenol and OTC cough medication. Patient reports she may have had the flu during Christmas.     Allergies  Allergen Reactions  . Furosemide Rash     Current Outpatient Medications:  .  acetaminophen (TYLENOL) 325 MG tablet, Take 650 mg by mouth every 6 (six) hours as needed., Disp: , Rfl:  .  ALPRAZolam (XANAX) 0.5 MG tablet, TAKE 1 TABLET BY MOUTH TWICE DAILY AS NEEDED ANXIETY, Disp: 60 tablet, Rfl: 5 .  Ascorbic Acid (VITAMIN C PO), Take by mouth., Disp: , Rfl:  .  citalopram (CELEXA) 40 MG tablet, TAKE 1 TABLET BY MOUTH ONCE DAILY, Disp: 90 tablet, Rfl: 1 .  omeprazole (PRILOSEC) 40 MG capsule, Take 1 capsule (40 mg total) by mouth daily., Disp: 30 capsule, Rfl: 3 .  pravastatin (PRAVACHOL) 40 MG tablet, TAKE 2 TABLETS BY MOUTH ONCE DAILY, Disp: 180 tablet, Rfl: 1 .  ranitidine (ZANTAC) 150 MG tablet, Take 1 tablet (150 mg total) by mouth 2 (two) times daily., Disp: 60 tablet, Rfl: 5 .  Vitamin D, Cholecalciferol, 1000 UNITS TABS, Take by mouth., Disp: , Rfl:  .  albuterol (PROVENTIL HFA;VENTOLIN HFA) 108 (90 Base) MCG/ACT inhaler, Inhale 2 puffs into the lungs every 6 (six) hours as needed for wheezing or shortness of breath., Disp: 1 Inhaler, Rfl: 2 .  fluticasone (FLONASE) 50 MCG/ACT nasal spray, Place 2 sprays into both nostrils daily.  (Patient not taking: Reported on 03/19/2018), Disp: 16 g, Rfl: 6 .  HYDROcodone-acetaminophen (NORCO) 5-325 MG tablet, Take 1 tablet by mouth every 6 (six) hours as needed for moderate pain., Disp: 120 tablet, Rfl: 0 .  predniSONE (STERAPRED UNI-PAK 21 TAB) 10 MG (21) TBPK tablet, Take 6 pills on day 1, 5 pills on day 2, 4 pills on day 3 and so on until complete., Disp: 21 tablet, Rfl: 0  Review of Systems  Constitutional: Negative.   HENT: Positive for congestion and sore throat.   Respiratory: Positive for cough, shortness of breath and wheezing.   Neurological: Positive for headaches.    Social History   Tobacco Use  . Smoking status: Current Some Day Smoker    Packs/day: 0.25    Start date: 04/03/2012  . Smokeless tobacco: Never Used  . Tobacco comment: SMokes 1 cigarettes daily  Substance Use Topics  . Alcohol use: Yes    Alcohol/week: 0.0 standard drinks    Comment: occasional      Objective:   BP 126/80 (BP Location: Left Arm, Patient Position: Sitting, Cuff Size: Normal)   Pulse 67   Temp 98.3 F (36.8 C) (Oral)   Resp 16   Wt 253 lb 12.8 oz (115.1 kg)   LMP 03/18/2001 (Approximate)   SpO2 96%   BMI 37.48 kg/m  Vitals:   03/19/18 1535  BP: 126/80  Pulse: 67  Resp: 16  Temp: 98.3 F (36.8 C)  TempSrc: Oral  SpO2: 96%  Weight: 253 lb 12.8 oz (115.1 kg)     Physical Exam Constitutional:      Appearance: She is well-developed.  HENT:     Right Ear: External ear normal.     Left Ear: External ear normal.     Mouth/Throat:     Pharynx: No oropharyngeal exudate.  Eyes:     General:        Right eye: Discharge present.        Left eye: Discharge present. Neck:     Musculoskeletal: Neck supple.  Cardiovascular:     Rate and Rhythm: Normal rate and regular rhythm.  Pulmonary:     Effort: Pulmonary effort is normal. No respiratory distress.     Breath sounds: Wheezing present. No rales.  Lymphadenopathy:     Cervical: No cervical adenopathy.  Skin:     General: Skin is warm and dry.  Psychiatric:        Behavior: Behavior normal.         Assessment & Plan    1. Bronchitis  Patient declines prednisone. Call back if not improving.   - albuterol (PROVENTIL HFA;VENTOLIN HFA) 108 (90 Base) MCG/ACT inhaler; Inhale 2 puffs into the lungs every 6 (six) hours as needed for wheezing or shortness of breath.  Dispense: 1 Inhaler; Refill: 2  2. Cough  - albuterol (PROVENTIL HFA;VENTOLIN HFA) 108 (90 Base) MCG/ACT inhaler; Inhale 2 puffs into the lungs every 6 (six) hours as needed for wheezing or shortness of breath.  Dispense: 1 Inhaler; Refill: 2  The entirety of the information documented in the History of Present Illness, Review of Systems and Physical Exam were personally obtained by me. Portions of this information were initially documented by Rondel Baton, CMA and reviewed by me for thoroughness and accuracy.    Return if symptoms worsen or fail to improve.       Trey Sailors, PA-C  [facility]

## 2018-03-19 NOTE — Patient Instructions (Signed)
Acute Bronchitis, Adult Acute bronchitis is when air tubes (bronchi) in the lungs suddenly get swollen. The condition can make it hard to breathe. It can also cause these symptoms:  A cough.  Coughing up clear, yellow, or green mucus.  Wheezing.  Chest congestion.  Shortness of breath.  A fever.  Body aches.  Chills.  A sore throat. Follow these instructions at home:  Medicines  Take over-the-counter and prescription medicines only as told by your doctor.  If you were prescribed an antibiotic medicine, take it as told by your doctor. Do not stop taking the antibiotic even if you start to feel better. General instructions  Rest.  Drink enough fluids to keep your pee (urine) pale yellow.  Avoid smoking and secondhand smoke. If you smoke and you need help quitting, ask your doctor. Quitting will help your lungs heal faster.  Use an inhaler, cool mist vaporizer, or humidifier as told by your doctor.  Keep all follow-up visits as told by your doctor. This is important. How is this prevented? To lower your risk of getting this condition again:  Wash your hands often with soap and water. If you cannot use soap and water, use hand sanitizer.  Avoid contact with people who have cold symptoms.  Try not to touch your hands to your mouth, nose, or eyes.  Make sure to get the flu shot every year. Contact a doctor if:  Your symptoms do not get better in 2 weeks. Get help right away if:  You cough up blood.  You have chest pain.  You have very bad shortness of breath.  You become dehydrated.  You faint (pass out) or keep feeling like you are going to pass out.  You keep throwing up (vomiting).  You have a very bad headache.  Your fever or chills gets worse. This information is not intended to replace advice given to you by your health care provider. Make sure you discuss any questions you have with your health care provider. Document Released: 08/21/2007 Document  Revised: 10/16/2016 Document Reviewed: 08/23/2015 Elsevier Interactive Patient Education  2019 Elsevier Inc.  

## 2018-03-23 ENCOUNTER — Other Ambulatory Visit: Payer: Self-pay

## 2018-03-23 DIAGNOSIS — M545 Low back pain, unspecified: Secondary | ICD-10-CM

## 2018-03-23 DIAGNOSIS — G8929 Other chronic pain: Secondary | ICD-10-CM

## 2018-03-23 MED ORDER — HYDROCODONE-ACETAMINOPHEN 5-325 MG PO TABS: 1.0000 | ORAL_TABLET | Freq: Four times a day (QID) | ORAL | 0 refills | Status: DC | PRN

## 2018-03-24 ENCOUNTER — Telehealth: Payer: Self-pay | Admitting: Physician Assistant

## 2018-03-24 DIAGNOSIS — J40 Bronchitis, not specified as acute or chronic: Secondary | ICD-10-CM

## 2018-03-24 MED ORDER — PREDNISONE 10 MG (21) PO TBPK: ORAL_TABLET | ORAL | 0 refills | Status: DC

## 2018-03-24 NOTE — Telephone Encounter (Signed)
Please advise 

## 2018-03-24 NOTE — Telephone Encounter (Signed)
Patient advised as below.  

## 2018-03-24 NOTE — Telephone Encounter (Signed)
Pt is still having some coughing and coughing up mucus. Needing another round of medication to to get rid of the bronchitis.  Please advise.  Thanks, [employer]

## 2018-03-24 NOTE — Telephone Encounter (Signed)
Sent in new prednisone taper. She may continue to cough for several weeks. Needs re-evaluation if worsening.

## 2018-04-20 ENCOUNTER — Other Ambulatory Visit: Payer: Self-pay | Admitting: Physician Assistant

## 2018-04-20 DIAGNOSIS — M545 Low back pain, unspecified: Secondary | ICD-10-CM

## 2018-04-20 DIAGNOSIS — G8929 Other chronic pain: Secondary | ICD-10-CM

## 2018-04-20 MED ORDER — HYDROCODONE-ACETAMINOPHEN 5-325 MG PO TABS: 1.0000 | ORAL_TABLET | Freq: Four times a day (QID) | ORAL | 0 refills | Status: DC | PRN

## 2018-04-20 NOTE — Telephone Encounter (Signed)
NCCSR reviewed. Norco refilled

## 2018-04-20 NOTE — Telephone Encounter (Signed)
Patient requesting refill on Norco sent to [facility].

## 2018-05-06 ENCOUNTER — Emergency Department
Admission: EM | Admit: 2018-05-06 | Discharge: 2018-05-06 | Discharge status: Against Medical Advice | Payer: Self-pay | Attending: Emergency Medicine | Admitting: Emergency Medicine

## 2018-05-06 ENCOUNTER — Telehealth: Payer: Self-pay

## 2018-05-06 DIAGNOSIS — M1711 Unilateral primary osteoarthritis, right knee: Secondary | ICD-10-CM | POA: Insufficient documentation

## 2018-05-06 DIAGNOSIS — R51 Headache: Secondary | ICD-10-CM | POA: Insufficient documentation

## 2018-05-06 DIAGNOSIS — Z5321 Procedure and treatment not carried out due to patient leaving prior to being seen by health care provider: Secondary | ICD-10-CM | POA: Insufficient documentation

## 2018-05-06 DIAGNOSIS — E669 Obesity, unspecified: Secondary | ICD-10-CM | POA: Insufficient documentation

## 2018-05-06 NOTE — Telephone Encounter (Signed)
Manda from [facility] in [place] calling that patient is in there office and patient's blood pressure at first was 184/104 they waited a few minutes and recheck again and it is 120/115 and manually is 190/110 patient is complaining of  Headache and nausea. Per provider Antony Contras) patient to go to the ED.

## 2018-05-06 NOTE — ED Triage Notes (Signed)
Pt sent to ER for eval of blood pressure.  Pt was seen today by orth for knee pain and blood pressure was elevated.  No hx htn.  Pt alert  Speech clear.

## 2018-05-06 NOTE — ED Notes (Signed)
Triage not complete.  Pt decided to leave and see her pmd tomorrow.  ER very busy

## 2018-05-06 NOTE — Telephone Encounter (Signed)
Agreed -

## 2018-05-08 ENCOUNTER — Ambulatory Visit (INDEPENDENT_AMBULATORY_CARE_PROVIDER_SITE_OTHER): Payer: Self-pay | Admitting: Physician Assistant

## 2018-05-08 ENCOUNTER — Encounter: Payer: Self-pay | Admitting: Physician Assistant

## 2018-05-08 VITALS — BP 154/91 | HR 79 | Temp 97.7°F | Resp 16 | Wt 264.0 lb

## 2018-05-08 DIAGNOSIS — M170 Bilateral primary osteoarthritis of knee: Secondary | ICD-10-CM

## 2018-05-08 DIAGNOSIS — B379 Candidiasis, unspecified: Secondary | ICD-10-CM

## 2018-05-08 DIAGNOSIS — M545 Low back pain: Secondary | ICD-10-CM

## 2018-05-08 DIAGNOSIS — I10 Essential (primary) hypertension: Secondary | ICD-10-CM

## 2018-05-08 DIAGNOSIS — G8929 Other chronic pain: Secondary | ICD-10-CM

## 2018-05-08 MED ORDER — OXYCODONE-ACETAMINOPHEN 5-325 MG PO TABS: 1.0000 | ORAL_TABLET | Freq: Four times a day (QID) | ORAL | 0 refills | Status: DC | PRN

## 2018-05-08 MED ORDER — AMLODIPINE BESYLATE 5 MG PO TABS: 5.0000 mg | ORAL_TABLET | Freq: Every day | ORAL | 3 refills | Status: DC

## 2018-05-08 MED ORDER — FLUCONAZOLE 150 MG PO TABS: 150.0000 mg | ORAL_TABLET | Freq: Once | ORAL | 0 refills | Status: AC

## 2018-05-08 NOTE — Progress Notes (Signed)
Patient: Kristin Aguirre Female    DOB: [birth year]   58 y.o.   MRN: [id number] Visit Date: 05/08/2018  Today's Provider: Margaretann Loveless, PA-C   Chief Complaint  Patient presents with  . Hypertension   Subjective:     HPI  Patient here today with c/o elevated blood pressure. [facility] called on 02/19 that patient blood pressure was running high 184/104; 210/115 automatic; and 190/110 manually patient at that time was symptomatic with headache and nausea and was advised to go to the ER. Patient went to the ED but then left was not seen because the ER was very busy. Patient reports that her blood pressure at the ER was 179/99.Patient reports that she feels dizzy and is having headache.  Patient reports that she fell at work three weeks ago and she was feeling dizzy.  Also reports she feels her Norco is not working as well as it once did. She feels she is immune to it from taking for so long. She does not want to increase dose but wants to see what can be done.   Allergies  Allergen Reactions  . Furosemide Rash     Current Outpatient Medications:  .  ALPRAZolam (XANAX) 0.5 MG tablet, TAKE 1 TABLET BY MOUTH TWICE DAILY AS NEEDED ANXIETY, Disp: 60 tablet, Rfl: 5 .  Ascorbic Acid (VITAMIN C PO), Take by mouth., Disp: , Rfl:  .  citalopram (CELEXA) 40 MG tablet, TAKE 1 TABLET BY MOUTH ONCE DAILY, Disp: 90 tablet, Rfl: 1 .  etodolac (LODINE) 500 MG tablet, Take by mouth., Disp: , Rfl:  .  HYDROcodone-acetaminophen (NORCO) 5-325 MG tablet, Take 1 tablet by mouth every 6 (six) hours as needed for moderate pain., Disp: 120 tablet, Rfl: 0 .  omeprazole (PRILOSEC) 40 MG capsule, Take 1 capsule (40 mg total) by mouth daily., Disp: 30 capsule, Rfl: 3 .  pravastatin (PRAVACHOL) 40 MG tablet, TAKE 2 TABLETS BY MOUTH ONCE DAILY, Disp: 180 tablet, Rfl: 1 .  Vitamin D, Cholecalciferol, 1000 UNITS TABS, Take by mouth., Disp: , Rfl:  .  albuterol (PROVENTIL HFA;VENTOLIN HFA)  108 (90 Base) MCG/ACT inhaler, Inhale 2 puffs into the lungs every 6 (six) hours as needed for wheezing or shortness of breath. (Patient not taking: Reported on 05/08/2018), Disp: 1 Inhaler, Rfl: 2 .  fluticasone (FLONASE) 50 MCG/ACT nasal spray, Place 2 sprays into both nostrils daily. (Patient not taking: Reported on 03/19/2018), Disp: 16 g, Rfl: 6  Review of Systems  Constitutional: Negative.   Eyes: Positive for visual disturbance.  Respiratory: Negative.   Cardiovascular: Negative.   Gastrointestinal: Negative.   Musculoskeletal: Positive for arthralgias, gait problem, joint swelling and myalgias.  Neurological: Positive for dizziness and headaches.    Social History   Tobacco Use  . Smoking status: Current Some Day Smoker    Packs/day: 0.25    Start date: 04/03/2012  . Smokeless tobacco: Never Used  . Tobacco comment: SMokes 1 cigarettes daily  Substance Use Topics  . Alcohol use: Yes    Alcohol/week: 0.0 standard drinks    Comment: occasional      Objective:   BP (!) 154/91 (BP Location: Left Arm, Patient Position: Sitting, Cuff Size: Large)   Pulse 79   Temp 97.7 F (36.5 C) (Oral)   Resp 16   Wt 264 lb (119.7 kg)   LMP 03/18/2001 (Approximate)   BMI 38.99 kg/m  Vitals:   05/08/18 1652  BP: (!) 154/91  Pulse: 79  Resp: 16  Temp: 97.7 F (36.5 C)  TempSrc: Oral  Weight: 264 lb (119.7 kg)     Physical Exam Vitals signs reviewed.  Constitutional:      General: She is not in acute distress.    Appearance: She is well-developed. She is not diaphoretic.  Neck:     Musculoskeletal: Normal range of motion and neck supple.     Thyroid: No thyromegaly.     Vascular: No JVD.     Trachea: No tracheal deviation.  Cardiovascular:     Rate and Rhythm: Normal rate and regular rhythm.     Heart sounds: Normal heart sounds. No murmur. No friction rub. No gallop.   Pulmonary:     Effort: Pulmonary effort is normal. No respiratory distress.     Breath sounds: Normal  breath sounds. No wheezing or rales.  Lymphadenopathy:     Cervical: No cervical adenopathy.        Assessment & Plan    1. Essential hypertension Will start amlodipine as below. I will see her back in 2-4 weeks. She is to call if symptoms worsen in the meantime.  - amLODipine (NORVASC) 5 MG tablet; Take 1 tablet (5 mg total) by mouth daily.  Dispense: 90 tablet; Refill: 3  2. Yeast infection From 2 rounds of prednisone. Diflucan filled as below.  - fluconazole (DIFLUCAN) 150 MG tablet; Take 1 tablet (150 mg total) by mouth once for 1 dose. May repeat in 48-72 hrs if needed  Dispense: 2 tablet; Refill: 0  3. Primary osteoarthritis of both knees Will change from hydrocodone-apap to oxycodone-apap with same quantities since she has built up a tolerance. She agrees. This was sent as below.  - oxyCODONE-acetaminophen (PERCOCET/ROXICET) 5-325 MG tablet; Take 1 tablet by mouth every 6 (six) hours as needed for severe pain.  Dispense: 120 tablet; Refill: 0  4. Chronic low back pain without sciatica, unspecified back pain laterality See above medical treatment plan. - oxyCODONE-acetaminophen (PERCOCET/ROXICET) 5-325 MG tablet; Take 1 tablet by mouth every 6 (six) hours as needed for severe pain.  Dispense: 120 tablet; Refill: 0     Margaretann Loveless, PA-C  [facility]

## 2018-05-08 NOTE — Patient Instructions (Signed)

## 2018-05-22 ENCOUNTER — Other Ambulatory Visit: Payer: Self-pay | Admitting: Orthopedic Surgery

## 2018-05-22 DIAGNOSIS — G8929 Other chronic pain: Secondary | ICD-10-CM

## 2018-05-22 DIAGNOSIS — M25362 Other instability, left knee: Secondary | ICD-10-CM

## 2018-05-22 DIAGNOSIS — M25562 Pain in left knee: Secondary | ICD-10-CM

## 2018-05-22 DIAGNOSIS — M1712 Unilateral primary osteoarthritis, left knee: Secondary | ICD-10-CM

## 2018-06-03 ENCOUNTER — Other Ambulatory Visit: Payer: Self-pay | Admitting: Physician Assistant

## 2018-06-03 DIAGNOSIS — M170 Bilateral primary osteoarthritis of knee: Secondary | ICD-10-CM

## 2018-06-03 DIAGNOSIS — G8929 Other chronic pain: Secondary | ICD-10-CM

## 2018-06-03 DIAGNOSIS — M545 Low back pain, unspecified: Secondary | ICD-10-CM

## 2018-06-03 MED ORDER — OXYCODONE-ACETAMINOPHEN 5-325 MG PO TABS: 1.0000 | ORAL_TABLET | Freq: Four times a day (QID) | ORAL | 0 refills | Status: DC | PRN

## 2018-06-03 NOTE — Addendum Note (Signed)
Addended by: Hyacinth Meeker on: 06/03/2018 10:54 AM   Modules accepted: Orders

## 2018-06-03 NOTE — Telephone Encounter (Signed)
Pt needing a refill on: ° °oxyCODONE-acetaminophen (PERCOCET/ROXICET) 5-325 MG tablet ° °Please fill at: ° °[facility] - [place], [place] - [address] [phone] (Phone) °[phone] (Fax  ° °Thanks, °[facility] °

## 2018-07-01 ENCOUNTER — Ambulatory Visit: Payer: Self-pay

## 2018-07-02 ENCOUNTER — Other Ambulatory Visit: Payer: Self-pay

## 2018-07-02 DIAGNOSIS — G8929 Other chronic pain: Secondary | ICD-10-CM

## 2018-07-02 DIAGNOSIS — M170 Bilateral primary osteoarthritis of knee: Secondary | ICD-10-CM

## 2018-07-02 DIAGNOSIS — M545 Low back pain: Secondary | ICD-10-CM

## 2018-07-02 MED ORDER — OXYCODONE-ACETAMINOPHEN 5-325 MG PO TABS: 1.0000 | ORAL_TABLET | Freq: Four times a day (QID) | ORAL | 0 refills | Status: DC | PRN

## 2018-07-02 NOTE — Telephone Encounter (Signed)
Patient called requesting refills

## 2018-07-10 ENCOUNTER — Other Ambulatory Visit: Payer: Self-pay | Admitting: Physician Assistant

## 2018-07-10 DIAGNOSIS — F419 Anxiety disorder, unspecified: Secondary | ICD-10-CM

## 2018-07-10 NOTE — Telephone Encounter (Signed)
L.O.V. 05/08/2018, please advise.

## 2018-07-27 ENCOUNTER — Other Ambulatory Visit: Payer: Self-pay | Admitting: Physician Assistant

## 2018-07-27 DIAGNOSIS — F419 Anxiety disorder, unspecified: Secondary | ICD-10-CM

## 2018-07-28 ENCOUNTER — Other Ambulatory Visit: Payer: Self-pay

## 2018-07-28 DIAGNOSIS — M545 Low back pain, unspecified: Secondary | ICD-10-CM

## 2018-07-28 DIAGNOSIS — G8929 Other chronic pain: Secondary | ICD-10-CM

## 2018-07-28 DIAGNOSIS — M170 Bilateral primary osteoarthritis of knee: Secondary | ICD-10-CM

## 2018-07-28 MED ORDER — OXYCODONE-ACETAMINOPHEN 5-325 MG PO TABS: 1.0000 | ORAL_TABLET | Freq: Four times a day (QID) | ORAL | 0 refills | Status: DC | PRN

## 2018-07-28 NOTE — Telephone Encounter (Signed)
Patient called requesting a refill on her percocet 5-325 MG. L.O.V. 05/08/2018.

## 2018-08-26 ENCOUNTER — Other Ambulatory Visit: Payer: Self-pay

## 2018-08-26 DIAGNOSIS — G8929 Other chronic pain: Secondary | ICD-10-CM

## 2018-08-26 DIAGNOSIS — M170 Bilateral primary osteoarthritis of knee: Secondary | ICD-10-CM

## 2018-08-26 DIAGNOSIS — M545 Low back pain, unspecified: Secondary | ICD-10-CM

## 2018-08-26 MED ORDER — OXYCODONE-ACETAMINOPHEN 5-325 MG PO TABS: 1.0000 | ORAL_TABLET | Freq: Four times a day (QID) | ORAL | 0 refills | Status: DC | PRN

## 2018-08-26 NOTE — Telephone Encounter (Signed)
Patient is requesting medication refill on Oxycodone.

## 2018-09-23 ENCOUNTER — Other Ambulatory Visit: Payer: Self-pay | Admitting: Physician Assistant

## 2018-09-23 DIAGNOSIS — M545 Low back pain, unspecified: Secondary | ICD-10-CM

## 2018-09-23 DIAGNOSIS — M170 Bilateral primary osteoarthritis of knee: Secondary | ICD-10-CM

## 2018-09-23 DIAGNOSIS — G8929 Other chronic pain: Secondary | ICD-10-CM

## 2018-09-23 MED ORDER — OXYCODONE-ACETAMINOPHEN 5-325 MG PO TABS: 1.0000 | ORAL_TABLET | Freq: Four times a day (QID) | ORAL | 0 refills | Status: DC | PRN

## 2018-09-23 NOTE — Telephone Encounter (Signed)
Pt needs a refill on   Oxycodone 5/325  Kristin Aguirre

## 2018-09-24 ENCOUNTER — Other Ambulatory Visit: Payer: Self-pay | Admitting: Physician Assistant

## 2018-09-24 DIAGNOSIS — G8929 Other chronic pain: Secondary | ICD-10-CM

## 2018-09-24 DIAGNOSIS — M170 Bilateral primary osteoarthritis of knee: Secondary | ICD-10-CM

## 2018-10-21 ENCOUNTER — Other Ambulatory Visit: Payer: Self-pay | Admitting: Physician Assistant

## 2018-10-21 DIAGNOSIS — G8929 Other chronic pain: Secondary | ICD-10-CM

## 2018-10-21 DIAGNOSIS — M170 Bilateral primary osteoarthritis of knee: Secondary | ICD-10-CM

## 2018-10-21 DIAGNOSIS — M545 Low back pain, unspecified: Secondary | ICD-10-CM

## 2018-10-21 MED ORDER — OXYCODONE-ACETAMINOPHEN 5-325 MG PO TABS: 1.0000 | ORAL_TABLET | Freq: Four times a day (QID) | ORAL | 0 refills | Status: DC | PRN

## 2018-10-21 NOTE — Telephone Encounter (Signed)
Pt contacted office for refill request on the following medications:  oxyCODONE-acetaminophen (PERCOCET/ROXICET) 5-325 MG tablet   [facility] Last OV: 05/08/2018 Please advise. Thanks TNP

## 2018-11-17 ENCOUNTER — Other Ambulatory Visit: Payer: Self-pay | Admitting: Physician Assistant

## 2018-11-17 DIAGNOSIS — M545 Low back pain, unspecified: Secondary | ICD-10-CM

## 2018-11-17 DIAGNOSIS — M170 Bilateral primary osteoarthritis of knee: Secondary | ICD-10-CM

## 2018-11-17 DIAGNOSIS — G8929 Other chronic pain: Secondary | ICD-10-CM

## 2018-11-17 MED ORDER — OXYCODONE-ACETAMINOPHEN 5-325 MG PO TABS: 1.0000 | ORAL_TABLET | Freq: Four times a day (QID) | ORAL | 0 refills | Status: DC | PRN

## 2018-11-17 NOTE — Telephone Encounter (Signed)
Pt needing a refill on:  oxyCODONE-acetaminophen (PERCOCET/ROXICET) 5-325 MG tablet  Please fill at:  [facility], [place] - [place]. [phone] (Phone) [phone] (Fax)   Thanks, [employer]

## 2018-12-15 ENCOUNTER — Other Ambulatory Visit: Payer: Self-pay | Admitting: Physician Assistant

## 2018-12-15 DIAGNOSIS — G8929 Other chronic pain: Secondary | ICD-10-CM

## 2018-12-15 DIAGNOSIS — M170 Bilateral primary osteoarthritis of knee: Secondary | ICD-10-CM

## 2018-12-15 NOTE — Telephone Encounter (Signed)
Pt calling for a refill on:  oxyCODONE-acetaminophen (PERCOCET/ROXICET) 5-325 MG tablet  Please fill at:  [facility], [place] - [place]. [phone] (Phone) [phone] (Fax)    Thanks, [employer]

## 2018-12-16 MED ORDER — OXYCODONE-ACETAMINOPHEN 5-325 MG PO TABS: 1.0000 | ORAL_TABLET | Freq: Four times a day (QID) | ORAL | 0 refills | Status: DC | PRN

## 2019-01-03 ENCOUNTER — Other Ambulatory Visit: Payer: Self-pay | Admitting: Physician Assistant

## 2019-01-03 DIAGNOSIS — F419 Anxiety disorder, unspecified: Secondary | ICD-10-CM

## 2019-01-12 ENCOUNTER — Other Ambulatory Visit: Payer: Self-pay | Admitting: Physician Assistant

## 2019-01-12 DIAGNOSIS — M170 Bilateral primary osteoarthritis of knee: Secondary | ICD-10-CM

## 2019-01-12 DIAGNOSIS — M545 Low back pain, unspecified: Secondary | ICD-10-CM

## 2019-01-12 DIAGNOSIS — G8929 Other chronic pain: Secondary | ICD-10-CM

## 2019-01-12 MED ORDER — OXYCODONE-ACETAMINOPHEN 5-325 MG PO TABS: 1.0000 | ORAL_TABLET | Freq: Four times a day (QID) | ORAL | 0 refills | Status: DC | PRN

## 2019-01-12 NOTE — Telephone Encounter (Signed)
Pt needing a refill on:  oxyCODONE-acetaminophen (PERCOCET/ROXICET) 5-325 MG tablet  Please fill at:  [facility], [place] - [place]. [phone] (Phone) [phone] (Fax   Thanks, [employer]

## 2019-01-13 ENCOUNTER — Other Ambulatory Visit: Payer: Self-pay | Admitting: Physician Assistant

## 2019-01-13 DIAGNOSIS — E7800 Pure hypercholesterolemia, unspecified: Secondary | ICD-10-CM

## 2019-02-08 ENCOUNTER — Other Ambulatory Visit: Payer: Self-pay | Admitting: Physician Assistant

## 2019-02-08 DIAGNOSIS — G8929 Other chronic pain: Secondary | ICD-10-CM

## 2019-02-08 DIAGNOSIS — M170 Bilateral primary osteoarthritis of knee: Secondary | ICD-10-CM

## 2019-02-08 MED ORDER — OXYCODONE-ACETAMINOPHEN 5-325 MG PO TABS: 1.0000 | ORAL_TABLET | Freq: Four times a day (QID) | ORAL | 0 refills | Status: DC | PRN

## 2019-02-08 NOTE — Telephone Encounter (Signed)
Medication Refill - Medication: oxyCODONE-acetaminophen (PERCOCET/ROXICET) 5-325 MG tablet  Has the patient contacted their pharmacy? yes (Agent: If no, request that the patient contact the pharmacy for the refill.) (Agent: If yes, when and what did the pharmacy advise?)  Preferred Pharmacy (with phone number or street name):  [facility], [place]. [phone] (Phone) [phone] (Fax)   Agent: Please be advised that RX refills may take up to 3 business days. We ask that you follow-up with your pharmacy.

## 2019-02-08 NOTE — Telephone Encounter (Signed)
Requested medication (s) are due for refill today:yes  Requested medication (s) are on the active medication list: yes  Last refill:  01/12/2019  Future visit scheduled: no  Notes to clinic:  Refill cannot be delegated    Requested Prescriptions  Pending Prescriptions Disp Refills   oxyCODONE-acetaminophen (PERCOCET/ROXICET) 5-325 MG tablet 120 tablet 0    Sig: Take 1 tablet by mouth every 6 (six) hours as needed for severe pain.     Not Delegated - Analgesics:  Opioid Agonist Combinations Failed - 02/08/2019  2:18 PM      Failed - This refill cannot be delegated      Failed - Urine Drug Screen completed in last 360 days.      Failed - Valid encounter within last 6 months    Recent Outpatient Visits          9 months ago Essential hypertension   [facility] [place], [place]   10 months ago [facility] [place], [place], [place]   1 year ago Gastroesophageal reflux disease without esophagitis   [facility] [place], [place], [place]   1 year ago Influenza [place], [place]   2 years ago Chest pain, unspecified type   [facility], [place], [place]

## 2019-03-08 ENCOUNTER — Other Ambulatory Visit: Payer: Self-pay | Admitting: Physician Assistant

## 2019-03-08 DIAGNOSIS — G8929 Other chronic pain: Secondary | ICD-10-CM

## 2019-03-08 DIAGNOSIS — M170 Bilateral primary osteoarthritis of knee: Secondary | ICD-10-CM

## 2019-03-08 MED ORDER — OXYCODONE-ACETAMINOPHEN 5-325 MG PO TABS: 1.0000 | ORAL_TABLET | Freq: Four times a day (QID) | ORAL | 0 refills | Status: DC | PRN

## 2019-03-08 NOTE — Telephone Encounter (Signed)
Patient is requesting refill on Oxycodone-Acet 5-325 mg. To be sent to [facility]

## 2019-04-07 ENCOUNTER — Other Ambulatory Visit: Payer: Self-pay | Admitting: Physician Assistant

## 2019-04-07 DIAGNOSIS — G8929 Other chronic pain: Secondary | ICD-10-CM

## 2019-04-07 DIAGNOSIS — M170 Bilateral primary osteoarthritis of knee: Secondary | ICD-10-CM

## 2019-04-07 DIAGNOSIS — M545 Low back pain, unspecified: Secondary | ICD-10-CM

## 2019-04-07 NOTE — Telephone Encounter (Signed)
Requested medication (s) are due for refill today - yes  Requested medication (s) are on the active medication list -yes  Future visit scheduled - no  Last refill: 03/10/19  Notes to clinic: Patient is requesting a refill of non delegated Rx  Requested Prescriptions  Pending Prescriptions Disp Refills   oxyCODONE-acetaminophen (PERCOCET/ROXICET) 5-325 MG tablet [Pharmacy Med Name: OXYCODONE-APAP 5-325 MG TAB] 120 tablet     Sig: TAKE 1 TABLET BY MOUTH EVERY 6 HOURS AS NEEDED SEVERE PAIN      Not Delegated - Analgesics:  Opioid Agonist Combinations Failed - 04/07/2019 11:15 AM      Failed - This refill cannot be delegated      Failed - Urine Drug Screen completed in last 360 days.      Failed - Valid encounter within last 6 months    Recent Outpatient Visits           11 months ago Essential hypertension   [facility] [place], [place], [place]   1 year ago Bronchitis   [facility] [place], [place]   1 year ago Gastroesophageal reflux disease without esophagitis   [facility], [place], [place]   2 years ago Influenza A   [facility] [place], [place]   2 years ago Chest pain, unspecified type   [facility], [place], [place]                  Requested Prescriptions  Pending Prescriptions Disp Refills   oxyCODONE-acetaminophen (PERCOCET/ROXICET) 5-325 MG tablet [Pharmacy Med Name: OXYCODONE-APAP 5-325 MG TAB] 120 tablet     Sig: TAKE 1 TABLET BY MOUTH EVERY 6 HOURS AS NEEDED SEVERE PAIN      Not Delegated - Analgesics:  Opioid Agonist Combinations Failed - 04/07/2019 11:15 AM      Failed - This refill cannot be delegated      Failed - Urine Drug Screen completed in last 360 days.      Failed - Valid encounter within last 6 months    Recent Outpatient Visits           11 months ago Essential hypertension   [facility] [place], [place],  [place]   1 year ago Bronchitis   [facility] [place], [place], [place]   1 year ago Gastroesophageal reflux disease without esophagitis   [facility], [place], [place]   2 years ago Influenza A   [facility] [place], [place]   2 years ago Chest pain, unspecified type   [facility], [place], [place]

## 2019-05-04 ENCOUNTER — Telehealth: Payer: Self-pay | Admitting: Family Medicine

## 2019-05-04 ENCOUNTER — Other Ambulatory Visit: Payer: Self-pay | Admitting: Physician Assistant

## 2019-05-04 DIAGNOSIS — M170 Bilateral primary osteoarthritis of knee: Secondary | ICD-10-CM

## 2019-05-04 DIAGNOSIS — G8929 Other chronic pain: Secondary | ICD-10-CM

## 2019-05-04 NOTE — Telephone Encounter (Signed)
Requested medication (s) are due for refill today:yes  Requested medication (s) are on the active medication list: yes  Last refill:  04/08/19  Future visit scheduled: yes  Notes to clinic:  not delegated    Requested Prescriptions  Pending Prescriptions Disp Refills   oxyCODONE-acetaminophen (PERCOCET/ROXICET) 5-325 MG tablet [Pharmacy Med Name: OXYCODONE-APAP 5-325 MG TAB] 120 tablet     Sig: TAKE 1 TABLET BY MOUTH EVERY 6 HOURS AS NEEDED SEVERE PAIN      Not Delegated - Analgesics:  Opioid Agonist Combinations Failed - 05/04/2019 11:25 AM      Failed - This refill cannot be delegated      Failed - Urine Drug Screen completed in last 360 days.      Failed - Valid encounter within last 6 months    Recent Outpatient Visits           12 months ago Essential hypertension   [facility] [place], [place], [place]   1 year ago Bronchitis   [facility] [place], [place]   2 years ago Gastroesophageal reflux disease without esophagitis   [facility], [place], [place]   2 years ago Influenza A   [facility] [place], [place]   2 years ago Chest pain, unspecified type   [facility], [place], [place]

## 2019-05-04 NOTE — Telephone Encounter (Signed)
Error

## 2019-05-31 ENCOUNTER — Other Ambulatory Visit: Payer: Self-pay | Admitting: Physician Assistant

## 2019-05-31 DIAGNOSIS — G8929 Other chronic pain: Secondary | ICD-10-CM

## 2019-05-31 DIAGNOSIS — M170 Bilateral primary osteoarthritis of knee: Secondary | ICD-10-CM

## 2019-05-31 MED ORDER — OXYCODONE-ACETAMINOPHEN 5-325 MG PO TABS: ORAL_TABLET | ORAL | 0 refills | Status: DC

## 2019-05-31 NOTE — Telephone Encounter (Signed)
Requested medication (s) are due for refill today: yes (earliest refill date 05/31/19)  Requested medication (s) are on the active medication list: yes  Last refill:  05/05/19  Future visit scheduled: no  Notes to clinic:  Medication not delegated to NT to refill   Requested Prescriptions  Pending Prescriptions Disp Refills   oxyCODONE-acetaminophen (PERCOCET/ROXICET) 5-325 MG tablet 120 tablet 0      Not Delegated - Analgesics:  Opioid Agonist Combinations Failed - 05/31/2019 11:39 AM      Failed - This refill cannot be delegated      Failed - Urine Drug Screen completed in last 360 days.      Failed - Valid encounter within last 6 months    Recent Outpatient Visits           1 year ago Essential hypertension   [facility] [place], [place], [place]   1 year ago Bronchitis   [facility] [place], [place]   2 years ago Gastroesophageal reflux disease without esophagitis   [facility], [place], [place]   2 years ago Influenza A   [facility] [place], [place]   2 years ago Chest pain, unspecified type   [facility], [place], [place]

## 2019-05-31 NOTE — Telephone Encounter (Signed)
LOV 05/09/19 LRF 05/05/19  # 120 only

## 2019-05-31 NOTE — Telephone Encounter (Signed)
Medication Refill - Medication: Oxycodone 5/325 #120  Has the patient contacted their pharmacy? No. (Agent: If no, request that the patient contact the pharmacy for the refill.) (Agent: If yes, when and what did the pharmacy advise?)  Preferred Pharmacy (with phone number or street name): [facility]  Agent: Please be advised that RX refills may take up to 3 business days. We ask that you follow-up with your pharmacy.

## 2019-06-07 ENCOUNTER — Other Ambulatory Visit: Payer: Self-pay

## 2019-06-08 ENCOUNTER — Ambulatory Visit: Payer: Self-pay

## 2019-06-15 ENCOUNTER — Ambulatory Visit
Admission: RE | Admit: 2019-06-15 | Discharge: 2019-06-15 | Disposition: A | Discharge status: Home / Self Care | Payer: Self-pay | Source: Ambulatory Visit | Attending: Oncology | Admitting: Oncology

## 2019-06-15 ENCOUNTER — Ambulatory Visit: Payer: Self-pay | Attending: Oncology | Admitting: *Deleted

## 2019-06-15 VITALS — BP 128/70 | HR 74 | Temp 96.6°F | Ht 69.0 in | Wt 246.6 lb

## 2019-06-15 DIAGNOSIS — N63 Unspecified lump in unspecified breast: Secondary | ICD-10-CM

## 2019-06-15 NOTE — Progress Notes (Signed)
  Subjective:     Patient ID: Kristin Aguirre, female   DOB: [birth year], 59 y.o.   MRN: [id number]  HPI   Review of Systems     Objective:   Physical Exam Chest:     Breasts:        Right: No swelling, bleeding, inverted nipple, mass, nipple discharge, skin change or tenderness.        Left: Mass present. No swelling, bleeding, inverted nipple, nipple discharge or skin change.    Lymphadenopathy:     Upper Body:     Right upper body: No supraclavicular or axillary adenopathy.     Left upper body: No supraclavicular or axillary adenopathy.        Assessment:     Patient presents to [facility] with complaints of a left breast mass times a couple of weeks.  States an occasional sharp pain at the site.  States her left breast feels heavy.  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  Taught self breast awareness.  Patient has a history of hysterectomy.  Pap omitted per protocol.  Patient has been screened for eligibility.  She does not have any insurance, Medicare or Medicaid.  She also meets financial eligibility.   Risk Assessment    No risk assessment data for the current encounter   Risk Scores      06/08/2019   Last edited by: Alta Corning, CMA   5-year risk: 1.7 %   Lifetime risk: 8.9 %             Plan:     Will get bilateral diagnostic mammogram and ultrasound.  If no findings on imaging,  I can have her return in a couple of weeks for a repeat exam or refer her to see Dr. Lemar Livings.  She has seen him in the past.  Will follow up per [facility] protocol.

## 2019-06-17 ENCOUNTER — Encounter: Payer: Self-pay | Admitting: *Deleted

## 2019-06-17 NOTE — Progress Notes (Signed)
Talked to patient today and reviewed her normal  Mammogram results and possibly surgical consult.  Since there is a palpable finding she would like to proceed with a follow up with Dr. Lemar Livings, whom sh has seen in the past.  His office is closed for the Easter Holiday.  Informed patient I would call them back next week.

## 2019-06-21 ENCOUNTER — Encounter: Payer: Self-pay | Admitting: *Deleted

## 2019-06-21 NOTE — Progress Notes (Signed)
Left patient a message with her appointment to see Dr. Lemar Livings for surgical consult on 06/29/19 @ 3:00.

## 2019-06-30 ENCOUNTER — Other Ambulatory Visit: Payer: Self-pay | Admitting: Physician Assistant

## 2019-06-30 DIAGNOSIS — M170 Bilateral primary osteoarthritis of knee: Secondary | ICD-10-CM

## 2019-06-30 DIAGNOSIS — G8929 Other chronic pain: Secondary | ICD-10-CM

## 2019-06-30 NOTE — Telephone Encounter (Signed)
Medication Refill - Medication: oxyCODONE-acetaminophen (PERCOCET/ROXICET) 5-325    Has the patient contacted their pharmacy? Yes.   (Agent: If no, request that the patient contact the pharmacy for the refill.) (Agent: If yes, when and what did the pharmacy advise?)  Preferred Pharmacy (with phone number or street name):  [facility] [place] [place] - [address]  [address] [place] [address]  Phone: [phone] Fax: [phone]     Agent: Please be advised that RX refills may take up to 3 business days. We ask that you follow-up with your pharmacy.

## 2019-06-30 NOTE — Telephone Encounter (Signed)
Requested medication (s) are due for refill today: yes  Requested medication (s) are on the active medication list: yes  Last refill:  05/31/2019  Future visit scheduled: no  Notes to clinic:  this refill cannot be delegated    Requested Prescriptions  Pending Prescriptions Disp Refills   oxyCODONE-acetaminophen (PERCOCET/ROXICET) 5-325 MG tablet 120 tablet 0    Sig: TAKE 1 TABLET BY MOUTH EVERY 6 HOURS AS NEEDED SEVERE PAIN      Not Delegated - Analgesics:  Opioid Agonist Combinations Failed - 06/30/2019  9:33 AM      Failed - This refill cannot be delegated      Failed - Urine Drug Screen completed in last 360 days.      Failed - Valid encounter within last 6 months    Recent Outpatient Visits           1 year ago Essential hypertension   [facility] [place], Alessandra Bevels, [place]   1 year ago Bronchitis   [facility] Os[place]do Angst [place], [place]   2 years ago Gastroesophageal reflux disease without esophagitis   [facility], Alessandra Bevels, [place]   2 years ago Influenza A   [facility] Os[place]do Angst M, [place]   2 years ago Chest pain, unspecified type   [facility], [place], [place]

## 2019-07-01 ENCOUNTER — Other Ambulatory Visit: Payer: Self-pay | Admitting: Physician Assistant

## 2019-07-01 DIAGNOSIS — E7800 Pure hypercholesterolemia, unspecified: Secondary | ICD-10-CM

## 2019-07-01 MED ORDER — OXYCODONE-ACETAMINOPHEN 5-325 MG PO TABS: ORAL_TABLET | ORAL | 0 refills | Status: DC

## 2019-07-01 NOTE — Telephone Encounter (Signed)
vm was left for patient to contact office for follow up

## 2019-07-07 ENCOUNTER — Other Ambulatory Visit: Payer: Self-pay | Admitting: Physician Assistant

## 2019-07-07 DIAGNOSIS — I10 Essential (primary) hypertension: Secondary | ICD-10-CM

## 2019-07-12 ENCOUNTER — Other Ambulatory Visit: Payer: Self-pay | Admitting: Physician Assistant

## 2019-07-12 DIAGNOSIS — I10 Essential (primary) hypertension: Secondary | ICD-10-CM

## 2019-07-12 NOTE — Telephone Encounter (Signed)
Requested medication (s) are due for refill today:   Was given a 30 day courtesy refill on 07/07/2019.  Needs appt for further refills.  Requested medication (s) are on the active medication list:   Yes  Future visit scheduled:   No   Last ordered: 07/07/2019  30 day supply  #30  0 refills.  Clinic note:   This is returned because she has been contacted to make an appt and given a 30 day courtesy supply on 07/07/2019 however she has not called in to make an appt.      Requested Prescriptions  Pending Prescriptions Disp Refills   amLODipine (NORVASC) 5 MG tablet [Pharmacy Med Name: AMLODIPINE BESYLATE 5 MG TAB] 90 tablet     Sig: TAKE 1 TABLET BY MOUTH ONCE DAILY      Cardiovascular:  Calcium Channel Blockers Failed - 07/12/2019 10:20 AM      Failed - Valid encounter within last 6 months    Recent Outpatient Visits           1 year ago Essential hypertension   [facility], Alessandra Bevels, [place]   1 year ago Bronchitis   [facility] [place], [place], [place]   2 years ago Gastroesophageal reflux disease without esophagitis   [facility], Alessandra Bevels, [place]   2 years ago Influenza A   [facility] Osvaldo Angst M, [place]   2 years ago Chest pain, unspecified type   [facility], Victorino Dike M, [place]              Passed - Last BP in normal range    BP Readings from Last 1 Encounters:  06/15/19 128/70

## 2019-07-12 NOTE — Telephone Encounter (Signed)
L.O.V. for HTN was on 05/08/2018 and no upcoming appointment.

## 2019-07-13 ENCOUNTER — Other Ambulatory Visit: Payer: Self-pay | Admitting: Physician Assistant

## 2019-07-13 DIAGNOSIS — F419 Anxiety disorder, unspecified: Secondary | ICD-10-CM

## 2019-07-13 NOTE — Telephone Encounter (Signed)
Requested medication (s) are due for refill today - yes  Requested medication (s) are on the active medication list -yes  Future visit scheduled -no  Last refill: -06/14/19  Notes to clinic: Request of non delegated Rx  Requested Prescriptions  Pending Prescriptions Disp Refills   ALPRAZolam (XANAX) 0.5 MG tablet [Pharmacy Med Name: ALPRAZOLAM 0.5 MG TAB] 60 tablet     Sig: TAKE 1 TABLET BY MOUTH TWICE DAILY AS NEEDED FOR ANXIETY      Not Delegated - Psychiatry:  Anxiolytics/Hypnotics Failed - 07/13/2019  2:21 PM      Failed - This refill cannot be delegated      Failed - Urine Drug Screen completed in last 360 days.      Failed - Valid encounter within last 6 months    Recent Outpatient Visits           1 year ago Essential hypertension   [facility] [place], Alessandra Bevels, [place]   1 year ago Bronchitis   [facility] Osvaldo Angst M, [place]   2 years ago Gastroesophageal reflux disease without esophagitis   [facility], Alessandra Bevels, [place]   2 years ago Influenza A   [facility] Os[place]do Angst M, [place]   2 years ago Chest pain, unspecified type   [facility], Alessandra Bevels, [place]                  Requested Prescriptions  Pending Prescriptions Disp Refills   ALPRAZolam (XANAX) 0.5 MG tablet [Pharmacy Med Name: ALPRAZOLAM 0.5 MG TAB] 60 tablet     Sig: TAKE 1 TABLET BY MOUTH TWICE DAILY AS NEEDED FOR ANXIETY      Not Delegated - Psychiatry:  Anxiolytics/Hypnotics Failed - 07/13/2019  2:21 PM      Failed - This refill cannot be delegated      Failed - Urine Drug Screen completed in last 360 days.      Failed - Valid encounter within last 6 months    Recent Outpatient Visits           1 year ago Essential hypertension   [facility], Alessandra Bevels, [place]   1 year ago Bronchitis   [facility] Osvaldo Angst M, [place]   2 years ago  Gastroesophageal reflux disease without esophagitis   [facility], Alessandra Bevels, [place]   2 years ago Influenza A   [facility] Osvaldo Angst M, [place]   2 years ago Chest pain, unspecified type   [facility], [place], [place]

## 2019-07-15 ENCOUNTER — Other Ambulatory Visit: Payer: Self-pay | Admitting: Physician Assistant

## 2019-07-15 DIAGNOSIS — I10 Essential (primary) hypertension: Secondary | ICD-10-CM

## 2019-07-15 MED ORDER — AMLODIPINE BESYLATE 5 MG PO TABS: 5.0000 mg | ORAL_TABLET | Freq: Every day | ORAL | 0 refills | Status: DC

## 2019-07-15 NOTE — Telephone Encounter (Signed)
Medication Refill - Medication:  amLODipine (NORVASC) 5 MG tablet   Pt scheduled med refill appt for 07/23/2019  Has the patient contacted their pharmacy? Yes.   (Agent: If no, request that the patient contact the pharmacy for the refill.) (Agent: If yes, when and what did the pharmacy advise?)  Preferred Pharmacy (with phone number or street name):  [facility] [place], [place] - [address]  [address] [place] [address]  Phone: [phone] Fax: [phone]     Agent: Please be advised that RX refills may take up to 3 business days. We ask that you follow-up with your pharmacy.

## 2019-07-15 NOTE — Telephone Encounter (Signed)
Requested Prescriptions  Pending Prescriptions Disp Refills  . amLODipine (NORVASC) 5 MG tablet 14 tablet 0    Sig: Take 1 tablet (5 mg total) by mouth daily.     Cardiovascular:  Calcium Channel Blockers Failed - 07/15/2019 11:51 AM      Failed - Valid encounter within last 6 months    Recent Outpatient Visits          1 year ago Essential hypertension   [facility] [place], Alessandra Bevels, [place]   1 year ago Bronchitis   [facility]do Angst [facility], [place]   2 years ago Gastroesophageal reflux disease without esophagitis   [facility], Alessandra Bevels, [place]   2 years ago Influenza A   [facility] Osvaldo Angst M, [place]   2 years ago Chest pain, unspecified type   [facility], [place], PA-C      Future Appointments            In 1 week [name], [place], PA-C [employer] [facility]           Passed - Last BP in normal range    BP Readings from Last 1 Encounters:  06/15/19 128/70

## 2019-07-19 ENCOUNTER — Ambulatory Visit: Payer: Self-pay | Admitting: Surgery

## 2019-07-21 ENCOUNTER — Ambulatory Visit: Payer: Self-pay | Admitting: Surgery

## 2019-07-22 ENCOUNTER — Other Ambulatory Visit: Payer: Self-pay | Admitting: Orthopedic Surgery

## 2019-07-22 DIAGNOSIS — M25562 Pain in left knee: Secondary | ICD-10-CM

## 2019-07-22 DIAGNOSIS — M1712 Unilateral primary osteoarthritis, left knee: Secondary | ICD-10-CM

## 2019-07-22 DIAGNOSIS — M25362 Other instability, left knee: Secondary | ICD-10-CM

## 2019-07-22 DIAGNOSIS — G8929 Other chronic pain: Secondary | ICD-10-CM

## 2019-07-23 ENCOUNTER — Ambulatory Visit (INDEPENDENT_AMBULATORY_CARE_PROVIDER_SITE_OTHER): Payer: 59 | Admitting: Physician Assistant

## 2019-07-23 ENCOUNTER — Other Ambulatory Visit: Payer: Self-pay

## 2019-07-23 ENCOUNTER — Encounter: Payer: Self-pay | Admitting: Physician Assistant

## 2019-07-23 VITALS — BP 116/66 | HR 78 | Temp 97.0°F | Resp 16 | Wt 249.0 lb

## 2019-07-23 DIAGNOSIS — E7800 Pure hypercholesterolemia, unspecified: Secondary | ICD-10-CM | POA: Diagnosis not present

## 2019-07-23 DIAGNOSIS — K2900 Acute gastritis without bleeding: Secondary | ICD-10-CM

## 2019-07-23 DIAGNOSIS — Z6836 Body mass index (BMI) 36.0-36.9, adult: Secondary | ICD-10-CM

## 2019-07-23 DIAGNOSIS — F419 Anxiety disorder, unspecified: Secondary | ICD-10-CM | POA: Diagnosis not present

## 2019-07-23 DIAGNOSIS — R600 Localized edema: Secondary | ICD-10-CM

## 2019-07-23 DIAGNOSIS — I10 Essential (primary) hypertension: Secondary | ICD-10-CM | POA: Diagnosis not present

## 2019-07-23 MED ORDER — PRAVASTATIN SODIUM 40 MG PO TABS: 80.0000 mg | ORAL_TABLET | Freq: Every day | ORAL | 3 refills | Status: DC

## 2019-07-23 MED ORDER — OMEPRAZOLE 40 MG PO CPDR: 40.0000 mg | DELAYED_RELEASE_CAPSULE | Freq: Every day | ORAL | 3 refills | Status: DC

## 2019-07-23 MED ORDER — TRIAMTERENE-HCTZ 37.5-25 MG PO TABS: 1.0000 | ORAL_TABLET | Freq: Every day | ORAL | 3 refills | Status: DC

## 2019-07-23 MED ORDER — ALPRAZOLAM 0.5 MG PO TABS: 0.5000 mg | ORAL_TABLET | Freq: Two times a day (BID) | ORAL | 5 refills | Status: DC | PRN

## 2019-07-23 MED ORDER — AMLODIPINE BESYLATE 5 MG PO TABS: 5.0000 mg | ORAL_TABLET | Freq: Every day | ORAL | 3 refills | Status: DC

## 2019-07-23 MED ORDER — CITALOPRAM HYDROBROMIDE 40 MG PO TABS: 40.0000 mg | ORAL_TABLET | Freq: Every day | ORAL | 3 refills | Status: DC

## 2019-07-23 NOTE — Progress Notes (Signed)
Established patient visit   Patient: Kristin Aguirre   DOB: 11/22/1960   59 y.o. Female  MRN: [id number] Visit Date: 07/23/2019  Today's healthcare provider: Margaretann Loveless, PA-C   No chief complaint on file.  Subjective    HPI Hypertension, follow-up  BP Readings from Last 3 Encounters:  07/23/19 116/66  06/15/19 128/70  05/08/18 (!) 154/91   Wt Readings from Last 3 Encounters:  07/23/19 249 lb (112.9 kg)  06/15/19 246 lb 9.6 oz (111.9 kg)  05/08/18 264 lb (119.7 kg)     She was last seen for hypertension 1 months ago.  BP at that visit was 154/91. Management since that visit includes was started on Amlodipine.  She reports good compliance with treatment. She is not having side effects.  She is following a Regular diet. She is exercising. She does smoke.  Outside blood pressures are none. Symptoms: No chest pain No chest pressure No palpitations No dyspnea No orthopnea No paroxysmal nocturnal dyspnea Yes lower extremity edema No syncope   Pertinent labs: Lab Results  Component Value Date   CHOL 222 (H) 04/22/2016   HDL 50 04/22/2016   LDLCALC 142 (H) 04/22/2016   TRIG 152 (H) 04/22/2016   CHOLHDL 4.4 04/22/2016   Lab Results  Component Value Date   NA 137 02/04/2017   K 3.6 02/04/2017   CO2 26 02/04/2017   GLUCOSE 93 02/04/2017   BUN 19 02/04/2017   CREATININE 0.71 02/04/2017   CALCIUM 9.7 02/04/2017   GFRNONAA >60 02/04/2017   GFRAA >60 02/04/2017     The ASCVD Risk score (Goff DC Jr., et al., 2013) failed to calculate for the following reasons:   Cannot find a previous HDL lab   Cannot find a previous total cholesterol lab   ---------------------------------------------------------------------------------------------------  Patient Active Problem List   Diagnosis Date Noted  . Essential hypertension 05/08/2018  . Primary osteoarthritis of both knees 04/11/2017  . Left-sided chest wall pain 08/10/2015  . Allergic rhinitis  01/31/2015  . Airway hyperreactivity 01/31/2015  . Atypical chest pain 01/31/2015  . Baker's cyst of knee 01/31/2015  . Chest pressure 01/31/2015  . Chronic headache 01/31/2015  . Tobacco use 01/31/2015  . Contact with and suspected exposure to infections with predominantly sexual mode of transmission 01/31/2015  . Acid reflux 01/31/2015  . Cannot sleep 01/31/2015  . Panic attack 01/31/2015  . Postsurgical menopause 01/31/2015  . Breath shortness 01/31/2015  . Avitaminosis D 01/31/2015  . Low back pain 11/29/2014  . Anxiety, generalized 01/14/2006  . Hyperlipidemia 01/14/2006   Past Medical History:  Diagnosis Date  . Allergy   . Anxiety   . Diffuse cystic mastopathy   . High cholesterol        Medications: Outpatient Medications Prior to Visit  Medication Sig  . Ascorbic Acid (VITAMIN C PO) Take by mouth.  . Vitamin D, Cholecalciferol, 1000 UNITS TABS Take by mouth.  . [DISCONTINUED] ALPRAZolam (XANAX) 0.5 MG tablet TAKE 1 TABLET BY MOUTH TWICE DAILY AS NEEDED FOR ANXIETY  . [DISCONTINUED] amLODipine (NORVASC) 5 MG tablet Take 1 tablet (5 mg total) by mouth daily.  . [DISCONTINUED] citalopram (CELEXA) 40 MG tablet TAKE 1 TABLET BY MOUTH ONCE DAILY  . [DISCONTINUED] omeprazole (PRILOSEC) 40 MG capsule Take 1 capsule (40 mg total) by mouth daily.  . [DISCONTINUED] oxyCODONE-acetaminophen (PERCOCET/ROXICET) 5-325 MG tablet TAKE 1 TABLET BY MOUTH EVERY 6 HOURS AS NEEDED SEVERE PAIN  . [DISCONTINUED] pravastatin (PRAVACHOL) 40 MG tablet TAKE  2 TABLETS BY MOUTH ONCE DAILY  . albuterol (PROVENTIL HFA;VENTOLIN HFA) 108 (90 Base) MCG/ACT inhaler Inhale 2 puffs into the lungs every 6 (six) hours as needed for wheezing or shortness of breath. (Patient not taking: Reported on 05/08/2018)  . fluticasone (FLONASE) 50 MCG/ACT nasal spray Place 2 sprays into both nostrils daily. (Patient not taking: Reported on 03/19/2018)   No facility-administered medications prior to visit.    Review of  Systems  Cardiovascular: Positive for leg swelling. Negative for chest pain and palpitations.    Last CBC Lab Results  Component Value Date   WBC 9.6 02/04/2017   HGB 12.4 02/04/2017   HCT 36.4 02/04/2017   MCV 89.6 02/04/2017   MCH 30.4 02/04/2017   RDW 12.7 02/04/2017   PLT 279 02/04/2017   Last metabolic panel Lab Results  Component Value Date   GLUCOSE 93 02/04/2017   NA 137 02/04/2017   K 3.6 02/04/2017   CL 102 02/04/2017   CO2 26 02/04/2017   BUN 19 02/04/2017   CREATININE 0.71 02/04/2017   GFRNONAA >60 02/04/2017   GFRAA >60 02/04/2017   CALCIUM 9.7 02/04/2017   PROT 7.6 04/22/2016   ALBUMIN 4.5 04/22/2016   LABGLOB 3.1 04/22/2016   AGRATIO 1.5 04/22/2016   BILITOT 0.4 04/22/2016   ALKPHOS 85 04/22/2016   AST 24 04/22/2016   ALT 26 04/22/2016   ANIONGAP 9 02/04/2017   Last lipids Lab Results  Component Value Date   CHOL 222 (H) 04/22/2016   HDL 50 04/22/2016   LDLCALC 142 (H) 04/22/2016   TRIG 152 (H) 04/22/2016   CHOLHDL 4.4 04/22/2016      Objective    BP 116/66 (BP Location: Left Arm, Patient Position: Sitting, Cuff Size: Large)   Pulse 78   Temp (!) 97 F (36.1 C) (Temporal)   Resp 16   Wt 249 lb (112.9 kg)   LMP 03/18/2001 (Approximate)   BMI 36.77 kg/m  BP Readings from Last 3 Encounters:  07/23/19 116/66  06/15/19 128/70  05/08/18 (!) 154/91   Wt Readings from Last 3 Encounters:  07/23/19 249 lb (112.9 kg)  06/15/19 246 lb 9.6 oz (111.9 kg)  05/08/18 264 lb (119.7 kg)      Physical Exam Vitals reviewed.  Constitutional:      General: She is not in acute distress.    Appearance: Normal appearance. She is well-developed. She is obese. She is not ill-appearing or diaphoretic.  Neck:     Thyroid: No thyromegaly.     Vascular: No carotid bruit or JVD.     Trachea: No tracheal deviation.  Cardiovascular:     Rate and Rhythm: Normal rate and regular rhythm.     Pulses: Normal pulses.     Heart sounds: Normal heart sounds. No  murmur. No friction rub. No gallop.   Pulmonary:     Effort: Pulmonary effort is normal. No respiratory distress.     Breath sounds: Normal breath sounds. No wheezing or rales.  Musculoskeletal:     Cervical back: Normal range of motion and neck supple.  Lymphadenopathy:     Cervical: No cervical adenopathy.  Neurological:     Mental Status: She is alert.  Psychiatric:        Mood and Affect: Mood normal.        Behavior: Behavior normal.        Thought Content: Thought content normal.        Judgment: Judgment normal.  No results found for any visits on 07/23/19.  Assessment & Plan     1. Acute anxiety Stable. Diagnosis pulled for medication refill. Continue current medical treatment plan. - ALPRAZolam (XANAX) 0.5 MG tablet; Take 1 tablet (0.5 mg total) by mouth 2 (two) times daily as needed. for anxiety  Dispense: 60 tablet; Refill: 5  2. Essential hypertension Stable. Diagnosis pulled for medication refill. Continue current medical treatment plan. - amLODipine (NORVASC) 5 MG tablet; Take 1 tablet (5 mg total) by mouth daily.  Dispense: 90 tablet; Refill: 3  3. Anxiety Stable. Diagnosis pulled for medication refill. Continue current medical treatment plan. - citalopram (CELEXA) 40 MG tablet; Take 1 tablet (40 mg total) by mouth daily.  Dispense: 90 tablet; Refill: 3  4. Acute gastritis without hemorrhage, unspecified gastritis type Stable. Diagnosis pulled for medication refill. Continue current medical treatment plan. - omeprazole (PRILOSEC) 40 MG capsule; Take 1 capsule (40 mg total) by mouth daily.  Dispense: 90 capsule; Refill: 3  5. Hypercholesterolemia Stable. Diagnosis pulled for medication refill. Continue current medical treatment plan. - pravastatin (PRAVACHOL) 40 MG tablet; Take 2 tablets (80 mg total) by mouth daily.  Dispense: 90 tablet; Refill: 3  6. Class 2 severe obesity due to excess calories with serious comorbidity and body mass index (BMI) of 36.0  to 36.9 in adult [facility]) Counseled patient on healthy lifestyle modifications including dieting and exercise.   7. Lower extremity edema New onset. Unable to tolerate Furosemide, developed rash. Will avoid other Loop diuretics at this time. Will try Maxzide as below. F/U in 3 months for CPE and labs. - triamterene-hydrochlorothiazide (MAXZIDE-25) 37.5-25 MG tablet; Take 1 tablet by mouth daily.  Dispense: 90 tablet; Refill: 3   Return in about 3 months (around 10/23/2019) for CPE.      Reynolds Bowl, PA-C, have reviewed all documentation for this visit. The documentation on 07/28/19 for the exam, diagnosis, procedures, and orders are all accurate and complete.   Rubye Beach  [facility] [phone] (phone) [phone] (fax)  [place]

## 2019-07-23 NOTE — Patient Instructions (Signed)
Hydrochlorothiazide, HCTZ; Triamterene Oral Tablets or Capsules What is this medicine? HYDROCHLOROTHIAZIDE; TRIAMTERENE (hye droe klor oh THYE a zide; trye AM ter een) is a combination of 2 diuretics. It helps you make more urine and to lose salt and excess water from your body. It treats swelling from heart, kidney, or liver disease. It also treats high blood pressure. This medicine may be used for other purposes; ask your health care provider or pharmacist if you have questions. COMMON BRAND NAME(S): Dyazide, Maxzide What should I tell my health care provider before I take this medicine? They need to know if you have any of these conditions:  diabetes  immune system problems, like lupus  kidney disease or stones  liver disease  small amount of urine or difficulty passing urine  an unusual or allergic reaction to triamterene, hydrochlorothiazide, sulfa drugs, other medicines, foods, dyes, or preservatives  pregnant or trying to get pregnant  breast-feeding How should I use this medicine? Take this drug by mouth. Take it as directed on the prescription label at the same time every day. You can take it with or without food. If it upsets your stomach, take it with food. Keep taking it unless your health care provider tells you to stop. Talk to your health care provider about the use of this drug in children. Special care may be needed. Overdosage: If you think you have taken too much of this medicine contact a poison control center or emergency room at once. NOTE: This medicine is only for you. Do not share this medicine with others. What if I miss a dose? If you miss a dose, take it as soon as you can. If it is almost time for your next dose, take only that dose. Do not take double or extra doses. What may interact with this medicine? Do not take this medicine with any of the following medications:  cidofovir  dofetilide  eplerenone  potassium  supplements  tranylcypromine This medicine may also interact with the following medications:  certain medicines for blood pressure, heart disease like benazepril, lisinopril, losartan, valsartan  lithium  medicines for diabetes  medicines that relax muscles for surgery  NSAIDs, medicines for pain and inflammation, like ibuprofen or naproxen  other diuretics  penicillin G potassium This list may not describe all possible interactions. Give your health care provider a list of all the medicines, herbs, non-prescription drugs, or dietary supplements you use. Also tell them if you smoke, drink alcohol, or use illegal drugs. Some items may interact with your medicine. What should I watch for while using this medicine? Visit your doctor or health care professional for regular check-ups. You will need lab work done before you start this medicine and regularly while you are taking it. Check your blood pressure regularly. Ask your health care professional what your blood pressure should be, and when you should contact them. This medicine may increase blood sugar. Ask your healthcare provider if changes in diet or medicines are needed if you have diabetes. You may need to be on a special diet while taking this medicine. Ask your doctor. Also, ask how many glasses of fluid you need to drink a day. You must not get dehydrated. You may get drowsy or dizzy. Do not drive, use machinery, or do anything that needs mental alertness until you know how this medicine affects you. Do not stand or sit up quickly, especially if you are an older patient. This reduces the risk of dizzy or fainting spells. Alcohol   may interfere with the effect of this medicine. Avoid or limit alcoholic drinks. Talk to your health care professional about your risk of skin cancer. You may be more at risk for skin cancer if you take this medicine. This medicine can make you more sensitive to the sun. Keep out of the sun. If you cannot  avoid being in the sun, wear protective clothing and use sunscreen. Do not use sun lamps or tanning beds/booths. What side effects may I notice from receiving this medicine? Side effects that you should report to your doctor or health care professional as soon as possible:  allergic reactions such as skin rash or itching, hives, swelling of the lips, mouth, tongue, or throat  changes in vision  eye pain  fast or irregular heartbeat, chest pain  feeling faint or dizzy  gout attack  muscle pain or cramps  numbness or tingling in hands, feet, or lips  pain or difficulty when passing urine  redness, blistering, peeling or loosening of the skin, including inside the mouth   signs and symptoms of high blood sugar such as being more thirsty or hungry or having to urinate more than normal. You may also feel very tired or have blurry vision.  shortness of breath  unusually weak Side effects that usually do not require medical attention (report to your doctor or health care professional if they continue or are bothersome):  change in sex drive or performance  dry mouth  headache  stomach upset This list may not describe all possible side effects. Call your doctor for medical advice about side effects. You may report side effects to FDA at 1-800-FDA-1088. Where should I keep my medicine? Keep out of the reach of children and pets. Store at room temperature between 20 and 25 degrees C (68 and 77 degrees F). Protect from light. Throw away any unused drug after the expiration date. NOTE: This sheet is a summary. It may not cover all possible information. If you have questions about this medicine, talk to your doctor, pharmacist, or health care provider.  2020 Elsevier/Gold Standard (2018-11-09 13:00:43)  

## 2019-07-27 ENCOUNTER — Other Ambulatory Visit: Payer: Self-pay | Admitting: General Practice

## 2019-07-27 ENCOUNTER — Other Ambulatory Visit: Payer: Self-pay | Admitting: Physician Assistant

## 2019-07-27 DIAGNOSIS — M170 Bilateral primary osteoarthritis of knee: Secondary | ICD-10-CM

## 2019-07-27 DIAGNOSIS — G8929 Other chronic pain: Secondary | ICD-10-CM

## 2019-07-27 DIAGNOSIS — M545 Low back pain: Secondary | ICD-10-CM

## 2019-07-27 MED ORDER — OXYCODONE-ACETAMINOPHEN 5-325 MG PO TABS: ORAL_TABLET | ORAL | 0 refills | Status: DC

## 2019-07-27 NOTE — Telephone Encounter (Signed)
Requested medication (s) are due for refill today:  Yes  Requested medication (s) are on the active medication list:  Yes  Future visit scheduled:  Yes  Last Refill: 07/01/19; # 120; no refills   Note to Clinic: Medication not delegated.   Requested Prescriptions  Pending Prescriptions Disp Refills   oxyCODONE-acetaminophen (PERCOCET/ROXICET) 5-325 MG tablet 120 tablet 0    Sig: TAKE 1 TABLET BY MOUTH EVERY 6 HOURS AS NEEDED SEVERE PAIN      Not Delegated - Analgesics:  Opioid Agonist Combinations Failed - 07/27/2019  8:55 AM      Failed - This refill cannot be delegated      Failed - Urine Drug Screen completed in last 360 days.      Passed - Valid encounter within last 6 months    Recent Outpatient Visits           4 days ago Acute anxiety   [facility] [place], Alessandra Bevels, [place]   1 year ago Essential hypertension   [facility] [place], Alessandra Bevels, [place]   1 year ago Bronchitis   [facility] Osvaldo Angst M, [place]   2 years ago Gastroesophageal reflux disease without esophagitis   [facility], Alessandra Bevels, [place]   2 years ago Influenza A   [facility] Osvaldo Angst M, [place]

## 2019-07-27 NOTE — Telephone Encounter (Signed)
RX REFILL oxyCODONE-acetaminophen (PERCOCET/ROXICET) 5-325 MG tablet  [facility]ARM[facility]Y [facility] - [place], [place] - [address] Phone:  [phone]  Fax:  [phone]

## 2019-07-28 ENCOUNTER — Ambulatory Visit: Payer: Self-pay | Admitting: Surgery

## 2019-08-04 ENCOUNTER — Ambulatory Visit: Payer: Self-pay | Admitting: Surgery

## 2019-08-11 ENCOUNTER — Ambulatory Visit (INDEPENDENT_AMBULATORY_CARE_PROVIDER_SITE_OTHER): Payer: Self-pay | Admitting: Surgery

## 2019-08-11 ENCOUNTER — Other Ambulatory Visit: Payer: Self-pay

## 2019-08-11 ENCOUNTER — Encounter: Payer: Self-pay | Admitting: Surgery

## 2019-08-11 VITALS — BP 116/84 | HR 71 | Temp 98.6°F | Resp 14 | Ht 69.5 in | Wt 243.0 lb

## 2019-08-11 DIAGNOSIS — N644 Mastodynia: Secondary | ICD-10-CM

## 2019-08-11 NOTE — Patient Instructions (Addendum)
Our office will contact you around April 2022 to schedule your mammogram and office visit for May 2022.  Please call the office if you have any questions or concerns.  Breast Self-Awareness Breast self-awareness means being familiar with how your breasts look and feel. It involves checking your breasts regularly and reporting any changes to your health care provider. Practicing breast self-awareness is important. Sometimes changes may not be harmful (are benign), but sometimes a change in your breasts can be a sign of a serious medical problem. It is important to learn how to do this procedure correctly so that you can catch problems early, when treatment is more likely to be successful. All women should practice breast self-awareness, including women who have had breast implants. What you need:  A mirror.  A well-lit room. How to do a breast self-exam A breast self-exam is one way to learn what is normal for your breasts and whether your breasts are changing. To do a breast self-exam: Look for changes  1. Remove all the clothing above your waist. 2. Stand in front of a mirror in a room with good lighting. 3. Put your hands on your hips. 4. Push your hands firmly downward. 5. Compare your breasts in the mirror. Look for differences between them (asymmetry), such as: ? Differences in shape. ? Differences in size. ? Puckers, dips, and bumps in one breast and not the other. 6. Look at each breast for changes in the skin, such as: ? Redness. ? Scaly areas. 7. Look for changes in your nipples, such as: ? Discharge. ? Bleeding. ? Dimpling. ? Redness. ? A change in position. Feel for changes Carefully feel your breasts for lumps and changes. It is best to do this while lying on your back on the floor, and again while sitting or standing in the tub or shower with soapy water on your skin. Feel each breast in the following way: 1. Place the arm on the side of the breast you are examining  above your head. 2. Feel your breast with the other hand. 3. Start in the nipple area and make -inch (2 cm) overlapping circles to feel your breast. Use the pads of your three middle fingers to do this. Apply light pressure, then medium pressure, then firm pressure. The light pressure will allow you to feel the tissue closest to the skin. The medium pressure will allow you to feel the tissue that is a little deeper. The firm pressure will allow you to feel the tissue close to the ribs. 4. Continue the overlapping circles, moving downward over the breast until you feel your ribs below your breast. 5. Move one finger-width toward the center of the body. Continue to use the -inch (2 cm) overlapping circles to feel your breast as you move slowly up toward your collarbone. 6. Continue the up-and-down exam using all three pressures until you reach your armpit.  Write down what you find Writing down what you find can help you remember what to discuss with your health care provider. Write down:  What is normal for each breast.  Any changes that you find in each breast, including: ? The kind of changes you find. ? Any pain or tenderness. ? Size and location of any lumps.  Where you are in your menstrual cycle, if you are still menstruating. General tips and recommendations  Examine your breasts every month.  If you are breastfeeding, the best time to examine your breasts is after a feeding or after  using a breast pump.  If you menstruate, the best time to examine your breasts is 5-7 days after your period. Breasts are generally lumpier during menstrual periods, and it may be more difficult to notice changes.  With time and practice, you will become more familiar with the variations in your breasts and more comfortable with the exam. Contact a health care provider if you:  See a change in the shape or size of your breasts or nipples.  See a change in the skin of your breast or nipples, such as  a reddened or scaly area.  Have unusual discharge from your nipples.  Find a lump or thick area that was not there before.  Have pain in your breasts.  Have any concerns related to your breast health. Summary  Breast self-awareness includes looking for physical changes in your breasts, as well as feeling for any changes within your breasts.  Breast self-awareness should be performed in front of a mirror in a well-lit room.  You should examine your breasts every month. If you menstruate, the best time to examine your breasts is 5-7 days after your menstrual period.  Let your health care provider know of any changes you notice in your breasts, including changes in size, changes on the skin, pain or tenderness, or unusual fluid from your nipples. This information is not intended to replace advice given to you by your health care provider. Make sure you discuss any questions you have with your health care provider. Document Revised: 10/21/2017 Document Reviewed: 10/21/2017 Elsevier Patient Education  [place].

## 2019-08-17 NOTE — Progress Notes (Signed)
Surgical Consultation  08/17/2019  Kristin Aguirre is an 59 y.o. female.   Chief Complaint  Patient presents with  . New Patient (Initial Visit)    Left breast thickening     HPI: Kristin Aguirre is a 59 year old female with a history of fibrocystic changes and ductal hyperplasia on the left breast now comes in with thickening and questionable mass on the left breast.  She reports that she had this for several weeks.  She experiences some intermittent pain that is mild intermittent worsening with the wrap.  The pain is sharp.  No other specific alleviating or aggravating factors.  He denies any fevers any chills any breast discharge.  She did have a mammogram and ultrasound that I have personally reviewed showing no evidence of any suspicious lesions.  Past Medical History:  Diagnosis Date  . Allergy   . Anxiety   . Diffuse cystic mastopathy   . High cholesterol     Past Surgical History:  Procedure Laterality Date  . ABDOMINAL HYSTERECTOMY  2003  . BREAST BIOPSY Left 2011, 2015   neg  . 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  Marland Kitchen cervical spine repair     . TONSILLECTOMY      Family History  Problem Relation Age of Onset  . Heart block Mother   . Cancer Mother        on leg  . Diabetes Mother   . Heart disease Mother   . Diabetes Father   . Heart disease Father   . CVA Father   . Asthma Sister   . Anemia Sister   . Seizures Brother   . Breast cancer Maternal Grandmother 15    Social History:  reports that she has been smoking. She started smoking about 7 years ago. She has been smoking about 0.25 packs per day. She has never used smokeless tobacco. She reports current alcohol use. She reports that she does not use drugs.  Allergies:  Allergies  Allergen Reactions  . Furosemide Rash    Medications  reviewed.     ROS Full ROS performed and is otherwise negative other than what is stated in the HPI    BP 116/84   Pulse 71   Temp 98.6 F (37 C)   Resp 14   Ht 5' 9.5" (1.765 m)   Wt 243 lb (110.2 kg)   LMP 03/18/2001 (Approximate)   SpO2 98%   BMI 35.37 kg/m   Physical Exam Vitals and nursing note reviewed. Exam conducted with a chaperone present.  Constitutional:      General: She is not in acute distress.    Appearance: Normal appearance. She is normal weight. She is not toxic-appearing.  Eyes:     General: No scleral icterus.       Right eye: No discharge.        Left eye: No discharge.  Cardiovascular:     Rate and Rhythm: Normal rate and regular rhythm.     Heart sounds: No murmur.  Pulmonary:     Effort: Pulmonary effort is normal. No respiratory distress.     Breath sounds: No stridor.     Comments: BREAST : No evidence of any palpable lesions on either breast.  No evidence of lymphadenopathy.  No evidence of nipple discharge and no evidence of any skin lesions or nipple lesions Abdominal:     General: Abdomen is flat.  There is no distension.     Palpations: Abdomen is soft. There is no mass.     Tenderness: There is no abdominal tenderness. There is no guarding or rebound.     Hernia: No hernia is present.  Musculoskeletal:        General: Normal range of motion.     Cervical back: Normal range of motion and neck supple. No rigidity.  Skin:    General: Skin is warm and dry.     Capillary Refill: Capillary refill takes less than 2 seconds.  Neurological:     General: No focal deficit present.     Mental Status: She is alert and oriented to person, place, and time.  Psychiatric:        Mood and Affect: Mood normal.        Behavior: Behavior normal.        Thought Content: Thought content normal.        Judgment: Judgment normal.      Assessment/Plan: 59 year old female with mastalgia in the left breast.  There is no evidence of suspicious lesions  at this time.  Recommend yearly mammogram with physical exams.  She may try NSAIDs warm compresses and gross oil.  There is no need for further studies or biopsies at this time.  Extensive counseling provided  Caroleen Hamman, MD [facility] General Surgeon

## 2019-08-26 ENCOUNTER — Other Ambulatory Visit: Payer: Self-pay | Admitting: Physician Assistant

## 2019-08-26 DIAGNOSIS — M545 Low back pain, unspecified: Secondary | ICD-10-CM

## 2019-08-26 DIAGNOSIS — M170 Bilateral primary osteoarthritis of knee: Secondary | ICD-10-CM

## 2019-08-26 DIAGNOSIS — G8929 Other chronic pain: Secondary | ICD-10-CM

## 2019-08-26 MED ORDER — OXYCODONE-ACETAMINOPHEN 5-325 MG PO TABS: ORAL_TABLET | ORAL | 0 refills | Status: DC

## 2019-08-26 NOTE — Telephone Encounter (Signed)
Copied from CRM [id number]. Topic: Quick Communication - Rx Refill/Question >> Aug 26, 2019  8:08 AM Jaquita Rector A wrote: Medication: oxyCODONE-acetaminophen (PERCOCET/ROXICET) 5-325 MG tablet    Has the patient contacted their pharmacy? Yes.   (Agent: If no, request that the patient contact the pharmacy for the refill.) (Agent: If yes, when and what did the pharmacy advise?)  Preferred Pharmacy (with phone number or street name): [facility] - [place], [place] - [address]  Phone:  [phone] Fax:  [phone]     Agent: Please be advised that RX refills may take up to 3 business days. We ask that you follow-up with your pharmacy.

## 2019-08-26 NOTE — Telephone Encounter (Signed)
Requested medication (s) are due for refill today: yes  Requested medication (s) are on the active medication list: yes  Last refill:  07/27/2019  Future visit scheduled: yes  Notes to clinic:  this refill cannot be delegated    Requested Prescriptions  Pending Prescriptions Disp Refills   oxyCODONE-acetaminophen (PERCOCET/ROXICET) 5-325 MG tablet 120 tablet 0    Sig: TAKE 1 TABLET BY MOUTH EVERY 6 HOURS AS NEEDED SEVERE PAIN      Not Delegated - Analgesics:  Opioid Agonist Combinations Failed - 08/26/2019  8:13 AM      Failed - This refill cannot be delegated      Failed - Urine Drug Screen completed in last 360 days.      Passed - Valid encounter within last 6 months    Recent Outpatient Visits           1 month ago Acute anxiety   [facility] [place], Alessandra Bevels, [place]   1 year ago Essential hypertension   [facility] [place], Alessandra Bevels, [place]   1 year ago Bronchitis   [facility] Osvaldo Angst M, [place]   2 years ago Gastroesophageal reflux disease without esophagitis   [facility], Alessandra Bevels, [place]   2 years ago Influenza A   [facility] Osvaldo Angst M, [place]

## 2019-09-02 ENCOUNTER — Encounter: Payer: Self-pay | Admitting: *Deleted

## 2019-09-02 NOTE — Progress Notes (Signed)
Patient had consult with Dr. Everlene Farrier for the left breast thickening.  No suspicion for malignancy.  Next mammogram in one year.

## 2019-09-21 ENCOUNTER — Other Ambulatory Visit: Payer: Self-pay | Admitting: Physician Assistant

## 2019-09-21 DIAGNOSIS — G8929 Other chronic pain: Secondary | ICD-10-CM

## 2019-09-21 DIAGNOSIS — M170 Bilateral primary osteoarthritis of knee: Secondary | ICD-10-CM

## 2019-09-21 MED ORDER — OXYCODONE-ACETAMINOPHEN 5-325 MG PO TABS: ORAL_TABLET | ORAL | 0 refills | Status: DC

## 2019-09-21 NOTE — Telephone Encounter (Signed)
Pt called in to request a refill for oxyCODONE-acetaminophen (PERCOCET/ROXICET) 5-325 MG tablet     Pharmacy:  [facility] - [place], [place] - [address] Phone:  [phone]  Fax:  [phone]

## 2019-09-21 NOTE — Telephone Encounter (Signed)
Requested medication (s) are due for refill today: Due 09/25/2019  Requested medication (s) are on the active medication list: yes  Last refill: 09/25/2019  #120  0 refills  Future visit scheduled   yes 10/29/2019  Notes to clinic: not delegated  Requested Prescriptions  Pending Prescriptions Disp Refills   oxyCODONE-acetaminophen (PERCOCET/ROXICET) 5-325 MG tablet 120 tablet 0    Sig: TAKE 1 TABLET BY MOUTH EVERY 6 HOURS AS NEEDED SEVERE PAIN      Not Delegated - Analgesics:  Opioid Agonist Combinations Failed - 09/21/2019  4:34 PM      Failed - This refill cannot be delegated      Failed - Urine Drug Screen completed in last 360 days.      Passed - Valid encounter within last 6 months    Recent Outpatient Visits           2 months ago Acute anxiety   [facility] [place], Alessandra Bevels, [place]   1 year ago Essential hypertension   [facility] [place], Alessandra Bevels, [place]   1 year ago Bronchitis   [facility] Osvaldo Angst M, [place]   2 years ago Gastroesophageal reflux disease without esophagitis   [facility], Alessandra Bevels, [place]   2 years ago Influenza A   [facility] Trey Sailors, PA-C       Future Appointments             In 1 month [place], [place], PA-C [facility]

## 2019-09-23 ENCOUNTER — Other Ambulatory Visit: Payer: Self-pay | Admitting: Physician Assistant

## 2019-09-23 DIAGNOSIS — M170 Bilateral primary osteoarthritis of knee: Secondary | ICD-10-CM

## 2019-09-23 DIAGNOSIS — G8929 Other chronic pain: Secondary | ICD-10-CM

## 2019-10-21 ENCOUNTER — Other Ambulatory Visit: Payer: Self-pay | Admitting: Physician Assistant

## 2019-10-21 DIAGNOSIS — M170 Bilateral primary osteoarthritis of knee: Secondary | ICD-10-CM

## 2019-10-21 DIAGNOSIS — G8929 Other chronic pain: Secondary | ICD-10-CM

## 2019-10-21 DIAGNOSIS — M545 Low back pain, unspecified: Secondary | ICD-10-CM

## 2019-10-21 MED ORDER — OXYCODONE-ACETAMINOPHEN 5-325 MG PO TABS: ORAL_TABLET | ORAL | 0 refills | Status: DC

## 2019-10-21 NOTE — Telephone Encounter (Signed)
Requested medication (s) are due for refill today: yes  Requested medication (s) are on the active medication list: yes  Last refill:  09/21/2019  Future visit scheduled: yes   Notes to clinic:  this refill cannot be delegated    Requested Prescriptions  Pending Prescriptions Disp Refills   oxyCODONE-acetaminophen (PERCOCET/ROXICET) 5-325 MG tablet 120 tablet 0    Sig: TAKE 1 TABLET BY MOUTH EVERY 6 HOURS AS NEEDED SEVERE PAIN      Not Delegated - Analgesics:  Opioid Agonist Combinations Failed - 10/21/2019  8:55 AM      Failed - This refill cannot be delegated      Failed - Urine Drug Screen completed in last 360 days.      Passed - Valid encounter within last 6 months    Recent Outpatient Visits           3 months ago Acute anxiety   [facility] [place], Alessandra Bevels, [place]   1 year ago Essential hypertension   [facility] [place], Alessandra Bevels, [place]   1 year ago Bronchitis   [facility] Osvaldo Angst M, [place]   2 years ago Gastroesophageal reflux disease without esophagitis   [facility], Alessandra Bevels, [place]   2 years ago Influenza A   [facility] Trey Sailors, [place]       Future Appointments             In 1 week [name], [place], PA-C [facility]

## 2019-10-21 NOTE — Telephone Encounter (Signed)
Medication Refill - Medication: oxycodone   Has the patient contacted their pharmacy? Yes.   (Agent: If no, request that the patient contact the pharmacy for the refill.) (Agent: If yes, when and what did the pharmacy advise?)  Preferred Pharmacy (with phone number or street name):  [facility] - [place], [place] - [address]  [address] [place] [address]  Phone: [phone] Fax: [phone]  Hours: Not open 24 hours     Agent: Please be advised that RX refills may take up to 3 business days. We ask that you follow-up with your pharmacy.

## 2019-10-29 ENCOUNTER — Encounter: Payer: Self-pay | Admitting: Physician Assistant

## 2019-11-15 ENCOUNTER — Encounter: Payer: Self-pay | Admitting: Physician Assistant

## 2019-11-15 ENCOUNTER — Other Ambulatory Visit: Payer: Self-pay | Admitting: Physician Assistant

## 2019-11-15 DIAGNOSIS — G8929 Other chronic pain: Secondary | ICD-10-CM

## 2019-11-15 DIAGNOSIS — M170 Bilateral primary osteoarthritis of knee: Secondary | ICD-10-CM

## 2019-11-15 MED ORDER — OXYCODONE-ACETAMINOPHEN 5-325 MG PO TABS: ORAL_TABLET | ORAL | 0 refills | Status: DC

## 2019-11-15 NOTE — Telephone Encounter (Signed)
Requested medication (s) are due for refill today: yes  Requested medication (s) are on the active medication list: yes  Last refill:  10/21/2019  Future visit scheduled: yes   Notes to clinic:  this refill cannot be delegated    Requested Prescriptions  Pending Prescriptions Disp Refills   oxyCODONE-acetaminophen (PERCOCET/ROXICET) 5-325 MG tablet 120 tablet 0    Sig: TAKE 1 TABLET BY MOUTH EVERY 6 HOURS AS NEEDED SEVERE PAIN      Not Delegated - Analgesics:  Opioid Agonist Combinations Failed - 11/15/2019  8:37 AM      Failed - This refill cannot be delegated      Failed - Urine Drug Screen completed in last 360 days.      Passed - Valid encounter within last 6 months    Recent Outpatient Visits           3 months ago Acute anxiety   [facility] [place], Alessandra Bevels, [place]   1 year ago Essential hypertension   [facility] [place], Alessandra Bevels, [place]   1 year ago Bronchitis   [facility] Osvaldo Angst M, [place]   2 years ago Gastroesophageal reflux disease without esophagitis   [facility], Alessandra Bevels, [place]   2 years ago Influenza A   [facility] Trey Sailors, [place]       Future Appointments             In 1 week [name], [place], PA-C [facility]

## 2019-11-15 NOTE — Telephone Encounter (Signed)
Medication Refill - Medication: oxyCODONE-acetaminophen (PERCOCET/ROXICET) 5-325 MG tablet     Preferred Pharmacy (with phone number or street name):  [facility] - [place], [place] - [address] Phone:  [phone]  Fax:  [phone]       Agent: Please be advised that RX refills may take up to 3 business days. We ask that you follow-up with your pharmacy.

## 2019-11-18 ENCOUNTER — Other Ambulatory Visit: Payer: Self-pay | Admitting: Physician Assistant

## 2019-11-18 DIAGNOSIS — K2900 Acute gastritis without bleeding: Secondary | ICD-10-CM

## 2019-11-26 ENCOUNTER — Encounter: Payer: Self-pay | Admitting: Physician Assistant

## 2019-11-26 ENCOUNTER — Ambulatory Visit (INDEPENDENT_AMBULATORY_CARE_PROVIDER_SITE_OTHER): Payer: 59 | Admitting: Physician Assistant

## 2019-11-26 ENCOUNTER — Other Ambulatory Visit: Payer: Self-pay

## 2019-11-26 VITALS — Temp 97.0°F | Resp 16 | Ht 69.5 in | Wt 250.8 lb

## 2019-11-26 DIAGNOSIS — Z0000 Encounter for general adult medical examination without abnormal findings: Secondary | ICD-10-CM | POA: Diagnosis not present

## 2019-11-26 DIAGNOSIS — G43009 Migraine without aura, not intractable, without status migrainosus: Secondary | ICD-10-CM | POA: Diagnosis not present

## 2019-11-26 DIAGNOSIS — E7800 Pure hypercholesterolemia, unspecified: Secondary | ICD-10-CM

## 2019-11-26 DIAGNOSIS — E559 Vitamin D deficiency, unspecified: Secondary | ICD-10-CM

## 2019-11-26 DIAGNOSIS — Z1329 Encounter for screening for other suspected endocrine disorder: Secondary | ICD-10-CM

## 2019-11-26 DIAGNOSIS — Z1211 Encounter for screening for malignant neoplasm of colon: Secondary | ICD-10-CM | POA: Diagnosis not present

## 2019-11-26 DIAGNOSIS — Z1239 Encounter for other screening for malignant neoplasm of breast: Secondary | ICD-10-CM

## 2019-11-26 DIAGNOSIS — I10 Essential (primary) hypertension: Secondary | ICD-10-CM

## 2019-11-26 DIAGNOSIS — K219 Gastro-esophageal reflux disease without esophagitis: Secondary | ICD-10-CM | POA: Diagnosis not present

## 2019-11-26 MED ORDER — PANTOPRAZOLE SODIUM 40 MG PO TBEC: 40.0000 mg | DELAYED_RELEASE_TABLET | Freq: Every day | ORAL | 1 refills | Status: DC

## 2019-11-26 MED ORDER — BUTALBITAL-APAP-CAFFEINE 50-325-40 MG PO TABS: 1.0000 | ORAL_TABLET | Freq: Four times a day (QID) | ORAL | 0 refills | Status: DC | PRN

## 2019-11-26 NOTE — Patient Instructions (Signed)

## 2019-11-26 NOTE — Progress Notes (Signed)
Complete physical exam   Patient: Kristin Aguirre   DOB: [birth year]   59 y.o. Female  MRN: [id number] Visit Date: 11/26/2019  Today's healthcare provider: Margaretann Loveless, PA-C   Chief Complaint  Patient presents with  . Annual Exam   Subjective    Kristin Aguirre is a 59 y.o. female who presents today for a complete physical exam.  She reports consuming a general diet. Home exercise routine includes walks for 10 minutes 3 times a week. She generally feels fairly well. She reports sleeping well. She does have additional problems to discuss today.  HPI  Patient feels she might need something stronger for indigestion. She reports that for the past couple of weeks she has been experiencing some sob and chest pain that radiates to back. She is not sure if it is indigestion or what.  Pap:she reports that she gets them at [facility].  Past Medical History:  Diagnosis Date  . Allergy   . Anxiety   . Diffuse cystic mastopathy   . High cholesterol    Past Surgical History:  Procedure Laterality Date  . ABDOMINAL HYSTERECTOMY  2003  . BREAST BIOPSY Left 2011, 2015   neg  . 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  Marland Kitchen cervical spine repair     . TONSILLECTOMY     Social History   Socioeconomic History  . Marital status: Single    Spouse name: Not on file  . Number of children: Not on file  . Years of education: Not on file  . Highest education level: Not on file  Occupational History  . Not on file  Tobacco Use  . Smoking status: Former Smoker    Packs/day: 0.25    Start date: 04/03/2012    Quit date: 10/26/2019    Years since quitting: 0.1  . Smokeless tobacco: Never Used  . Tobacco comment: SMokes 1 cigarettes daily  Substance and Sexual Activity  . Alcohol use: Yes    Alcohol/week: 0.0 standard drinks     Comment: occasional  . Drug use: No  . Sexual activity: Not on file  Other Topics Concern  . Not on file  Social History Narrative  . Not on file   Social Determinants of Health   Financial Resource Strain:   . Difficulty of Paying Living Expenses: Not on file  Food Insecurity:   . Worried About Programme researcher, broadcasting/film/video in the Last Year: Not on file  . Ran Out of Food in the Last Year: Not on file  Transportation Needs:   . Lack of Transportation (Medical): Not on file  . Lack of Transportation (Non-Medical): Not on file  Physical Activity:   . Days of Exercise per Week: Not on file  . Minutes of Exercise per Session: Not on file  Stress:   . Feeling of Stress : Not on file  Social Connections:   . Frequency of Communication with Friends and Family: Not on file  . Frequency of Social Gatherings with Friends and Family: Not on file  . Attends Religious Services: Not on file  . Active Member of Clubs or Organizations: Not on file  . Attends Banker Meetings: Not on file  . Marital Status: Not on file  Intimate Partner Violence:   . Fear of Current or Ex-Partner: Not on file  . Emotionally Abused: Not on file  .  Physically Abused: Not on file  . Sexually Abused: Not on file   Family Status  Relation Name Status  . Mother  Deceased at age 10  . Father  Deceased at age 35  . Sister  Alive  . Brother  Alive       spinal meningitits age 80  . MGM  (Not Specified)   Family History  Problem Relation Age of Onset  . Heart block Mother   . Cancer Mother        on leg  . Diabetes Mother   . Heart disease Mother   . Diabetes Father   . Heart disease Father   . CVA Father   . Asthma Sister   . Anemia Sister   . Seizures Brother   . Breast cancer Maternal Grandmother 55   Allergies  Allergen Reactions  . Furosemide Rash    Patient Care Team: Margaretann Loveless, PA-C as PCP - General (Family Medicine) Lemar Livings Merrily Pew, MD as Consulting Physician  (General Surgery) Jim Like, RN as Registered Nurse Scarlett Presto, RN as Registered Nurse   Medications: Outpatient Medications Prior to Visit  Medication Sig  . ALPRAZolam (XANAX) 0.5 MG tablet Take 1 tablet (0.5 mg total) by mouth 2 (two) times daily as needed. for anxiety  . Ascorbic Acid (VITAMIN C PO) Take by mouth.  . citalopram (CELEXA) 40 MG tablet Take 1 tablet (40 mg total) by mouth daily.  Marland Kitchen oxyCODONE-acetaminophen (PERCOCET/ROXICET) 5-325 MG tablet TAKE 1 TABLET BY MOUTH EVERY 6 HOURS AS NEEDED SEVERE PAIN  . pravastatin (PRAVACHOL) 40 MG tablet Take 2 tablets (80 mg total) by mouth daily.  Marland Kitchen triamterene-hydrochlorothiazide (MAXZIDE-25) 37.5-25 MG tablet Take 1 tablet by mouth daily.  . Vitamin D, Cholecalciferol, 1000 UNITS TABS Take by mouth.  . [DISCONTINUED] omeprazole (PRILOSEC) 40 MG capsule Take 1 capsule (40 mg total) by mouth daily.  . [DISCONTINUED] amLODipine (NORVASC) 5 MG tablet Take 1 tablet (5 mg total) by mouth daily.   No facility-administered medications prior to visit.    Review of Systems  Constitutional: Positive for fatigue.  HENT: Negative.   Eyes: Negative.   Respiratory: Positive for chest tightness and shortness of breath.   Cardiovascular: Positive for chest pain.  Gastrointestinal: Positive for abdominal distention.  Endocrine: Negative.   Genitourinary: Negative.   Musculoskeletal: Positive for arthralgias and back pain.  Skin: Negative.   Allergic/Immunologic: Negative.   Neurological: Negative.   Hematological: Negative.   Psychiatric/Behavioral: Negative.       Objective    Temp (!) 97 F (36.1 C) (Oral)   Resp 16   Ht 5' 9.5" (1.765 m)   Wt 250 lb 12.8 oz (113.8 kg)   LMP 03/18/2001 (Approximate)   BMI 36.51 kg/m    Physical Exam Vitals reviewed.  Constitutional:      General: She is not in acute distress.    Appearance: Normal appearance. She is well-developed. She is obese. She is not ill-appearing or  diaphoretic.  HENT:     Head: Normocephalic and atraumatic.     Right Ear: Tympanic membrane, ear canal and external ear normal.     Left Ear: Tympanic membrane, ear canal and external ear normal.  Eyes:     General: No scleral icterus.       Right eye: No discharge.        Left eye: No discharge.     Extraocular Movements: Extraocular movements intact.     Conjunctiva/sclera: Conjunctivae normal.  Pupils: Pupils are equal, round, and reactive to light.  Neck:     Thyroid: No thyromegaly.     Vascular: No carotid bruit or JVD.     Trachea: No tracheal deviation.  Cardiovascular:     Rate and Rhythm: Normal rate and regular rhythm.     Pulses: Normal pulses.     Heart sounds: Normal heart sounds. No murmur heard.  No friction rub. No gallop.   Pulmonary:     Effort: Pulmonary effort is normal. No respiratory distress.     Breath sounds: Normal breath sounds. No wheezing or rales.  Chest:     Chest wall: No tenderness.  Abdominal:     General: Abdomen is flat. Bowel sounds are normal. There is no distension.     Palpations: Abdomen is soft. There is no mass.     Tenderness: There is no abdominal tenderness. There is no guarding or rebound.  Musculoskeletal:        General: No tenderness. Normal range of motion.     Cervical back: Normal range of motion and neck supple.     Right lower leg: No edema.     Left lower leg: No edema.  Lymphadenopathy:     Cervical: No cervical adenopathy.  Skin:    General: Skin is warm and dry.     Capillary Refill: Capillary refill takes less than 2 seconds.     Findings: No rash.  Neurological:     General: No focal deficit present.     Mental Status: She is alert and oriented to person, place, and time. Mental status is at baseline.  Psychiatric:        Mood and Affect: Mood normal.        Behavior: Behavior normal.        Thought Content: Thought content normal.        Judgment: Judgment normal.      Last depression screening  scores PHQ 2/9 Scores 11/26/2019 07/23/2019 08/28/2016  PHQ - 2 Score 1 - 2  PHQ- 9 Score - - 5  Exception Documentation - Patient refusal -   Last fall risk screening Fall Risk  11/26/2019  Falls in the past year? 1  Number falls in past yr: 1  Comment -  Injury with Fall? 0  Risk for fall due to : -  Follow up Falls evaluation completed   Last Audit-C alcohol use screening Alcohol Use Disorder Test (AUDIT) 11/26/2019  1. How often do you have a drink containing alcohol? 2  2. How many drinks containing alcohol do you have on a typical day when you are drinking? 0  3. How often do you have six or more drinks on one occasion? 0  AUDIT-C Score 2  Alcohol Brief Interventions/Follow-up AUDIT Score <7 follow-up not indicated   A score of 3 or more in women, and 4 or more in men indicates increased risk for alcohol abuse, EXCEPT if all of the points are from question 1   No results found for any visits on 11/26/19.  Assessment & Plan    Routine Health Maintenance and Physical Exam  Exercise Activities and Dietary recommendations Goals   None     Immunization History  Administered Date(s) Administered  . Influenza,inj,Quad PF,6+ Mos 12/16/2012, 04/17/2016  . Moderna SARS-COVID-2 Vaccination 07/13/2019    Health Maintenance  Topic Date Due  . HIV Screening  Never done  . TETANUS/TDAP  Never done  . PAP SMEAR-Modifier  Never done  .  COLONOSCOPY  Never done  . MAMMOGRAM  02/11/2019  . COVID-19 Vaccine (2 - Moderna 2-dose series) 08/10/2019  . INFLUENZA VACCINE  10/17/2019  . Hepatitis C Screening  Completed    Discussed health benefits of physical activity, and encouraged her to engage in regular exercise appropriate for her age and condition.  1. Annual physical exam Normal physical exam today. Will check labs as below and f/u pending lab results. If labs are stable and WNL she will not need to have these rechecked for one year at her next annual physical exam. She is to  call the office in the meantime if she has any acute issue, questions or concerns.  2. Encounter for breast cancer screening using non-mammogram modality Ordered through [facility]. Continue self breast exams.   3. Colon cancer screening Due for colonoscopy. Referral placed. - Ambulatory referral to Gastroenterology  4. Essential hypertension Stable. Continue Maxzide 37.5-25mg  daily. Will check labs as below and f/u pending results. - CBC with Differential/Platelet - Comprehensive metabolic panel - Hemoglobin A1c - Lipid panel  5. Pure hypercholesterolemia Stable. Continue Pravastatin 40mg . Will check labs as below and f/u pending results. - CBC with Differential/Platelet - Comprehensive metabolic panel - Hemoglobin A1c - Lipid panel  6. Avitaminosis D H/O this and postmenopausal. On OTC Vit 1000 IU daily. Will check labs as below and f/u pending results. - CBC with Differential/Platelet - Vitamin D (25 hydroxy)  7. Thyroid disorder screen Will check labs as below and f/u pending results. - TSH  8. Gastroesophageal reflux disease without esophagitis Worsening. Will change omeprazole to pantoprazole as below. F/U pending results.  - pantoprazole (PROTONIX) 40 MG tablet; Take 1 tablet (40 mg total) by mouth daily.  Dispense: 90 tablet; Refill: 1 - US Abdomen Limited RUQ; Future  9. Migraine without aura and without status migrainosus, not intractable Rare migraines. Fioricet given as below for abortive therapy as she has used this in the past successfully.  - butalbital-acetaminophen-caffeine (FIORICET) 50-325-40 MG tablet; Take 1 tablet by mouth every 6 (six) hours as needed for migraine.  Dispense: 20 tablet; Refill: 0   Return in about 1 year (around 11/25/2020).     Delmer Islam, PA-C, have reviewed all documentation for this visit. The documentation on 12/04/19 for the exam, diagnosis, procedures, and orders are all accurate and complete.   Reine Just  [facility] [phone] (phone) [phone] (fax)  [facility]

## 2019-12-03 ENCOUNTER — Ambulatory Visit: Payer: 59

## 2019-12-08 ENCOUNTER — Other Ambulatory Visit: Payer: Self-pay | Admitting: Physician Assistant

## 2019-12-08 DIAGNOSIS — E7800 Pure hypercholesterolemia, unspecified: Secondary | ICD-10-CM

## 2019-12-10 ENCOUNTER — Other Ambulatory Visit: Payer: Self-pay

## 2019-12-10 ENCOUNTER — Ambulatory Visit
Admission: RE | Admit: 2019-12-10 | Discharge: 2019-12-10 | Disposition: A | Discharge status: Home / Self Care | Payer: 59 | Source: Ambulatory Visit | Attending: Physician Assistant | Admitting: Physician Assistant

## 2019-12-10 DIAGNOSIS — K219 Gastro-esophageal reflux disease without esophagitis: Secondary | ICD-10-CM | POA: Diagnosis present

## 2019-12-13 ENCOUNTER — Telehealth: Payer: Self-pay

## 2019-12-13 ENCOUNTER — Other Ambulatory Visit: Payer: Self-pay | Admitting: Physician Assistant

## 2019-12-13 ENCOUNTER — Encounter: Payer: Self-pay | Admitting: Physician Assistant

## 2019-12-13 DIAGNOSIS — M545 Low back pain, unspecified: Secondary | ICD-10-CM

## 2019-12-13 DIAGNOSIS — G8929 Other chronic pain: Secondary | ICD-10-CM

## 2019-12-13 DIAGNOSIS — M170 Bilateral primary osteoarthritis of knee: Secondary | ICD-10-CM

## 2019-12-13 MED ORDER — OXYCODONE-ACETAMINOPHEN 5-325 MG PO TABS: ORAL_TABLET | ORAL | 0 refills | Status: DC

## 2019-12-13 NOTE — Telephone Encounter (Signed)
Copied from CRM [id number]. Topic: General - Other >> Dec 13, 2019 10:08 AM Marylen Ponto wrote: Reason for CRM: Pt called to request ultrasound results. Pt requests call back

## 2019-12-13 NOTE — Telephone Encounter (Signed)
Patient advised. Please see other telephone encounter addressing same issue

## 2019-12-13 NOTE — Telephone Encounter (Signed)
Requested medication (s) are due for refill today: Yes  Requested medication (s) are on the active medication list: Yes  Last refill:  11/15/19  Future visit scheduled: No  Notes to clinic:  See request.    Requested Prescriptions  Pending Prescriptions Disp Refills   oxyCODONE-acetaminophen (PERCOCET/ROXICET) 5-325 MG tablet 120 tablet 0    Sig: TAKE 1 TABLET BY MOUTH EVERY 6 HOURS AS NEEDED SEVERE PAIN      Not Delegated - Analgesics:  Opioid Agonist Combinations Failed - 12/13/2019 10:32 AM      Failed - This refill cannot be delegated      Failed - Urine Drug Screen completed in last 360 days.      Passed - Valid encounter within last 6 months    Recent Outpatient Visits           2 weeks ago Annual physical exam   [facility] [name] [place], [place]   4 months ago Acute anxiety   [facility] [place], Al[place]andra [place], [place]   1 year ago Essential hypertension   [facility] [place], [place], [place]   1 year ago Bronchitis   [facility] Os[place]do [place], [place]   2 years ago Gastroesophageal reflux disease without esophagitis   [facility], [place], [place]

## 2019-12-13 NOTE — Telephone Encounter (Signed)
Copied from CRM [id number]. Topic: Quick Communication - Rx Refill/Question >> Dec 13, 2019 10:06 AM Marylen Ponto wrote: Medication: oxyCODONE-acetaminophen (PERCOCET/ROXICET) 5-325 MG tablet  Has the patient contacted their pharmacy? no  Preferred Pharmacy (with phone number or street name): [facility] - [place], [place] - [address] Phone: [phone]  Fax: [phone]  Agent: Please be advised that RX refills may take up to 3 business days. We ask that you follow-up with your pharmacy.

## 2019-12-13 NOTE — Telephone Encounter (Signed)
Patient advised as directed below. 

## 2019-12-13 NOTE — Telephone Encounter (Signed)
-----   Message from Margaretann Loveless, [place] sent at 12/13/2019  8:33 AM EDT ----- [place] does show you do have a gallstone that could be causing the abdominal pain after eating like we discussed. There is no signs of infection or inflammation at this time. Making the dietary changes we discussed can help. If desired, I can refer to general surgery for consideration of elective removal of the gallbladder if it continues to cause issues. Also noted to have mild fatty liver. Again limiting fatty foods will help.

## 2020-01-10 ENCOUNTER — Encounter: Payer: Self-pay | Admitting: Physician Assistant

## 2020-01-10 ENCOUNTER — Other Ambulatory Visit: Payer: Self-pay | Admitting: Family Medicine

## 2020-01-10 DIAGNOSIS — M545 Low back pain, unspecified: Secondary | ICD-10-CM

## 2020-01-10 DIAGNOSIS — G8929 Other chronic pain: Secondary | ICD-10-CM

## 2020-01-10 DIAGNOSIS — M170 Bilateral primary osteoarthritis of knee: Secondary | ICD-10-CM

## 2020-01-10 MED ORDER — OXYCODONE-ACETAMINOPHEN 5-325 MG PO TABS: ORAL_TABLET | ORAL | 0 refills | Status: DC

## 2020-01-18 ENCOUNTER — Encounter: Payer: Self-pay | Admitting: Physician Assistant

## 2020-01-18 DIAGNOSIS — G8929 Other chronic pain: Secondary | ICD-10-CM

## 2020-01-18 DIAGNOSIS — M545 Low back pain, unspecified: Secondary | ICD-10-CM

## 2020-01-18 DIAGNOSIS — M170 Bilateral primary osteoarthritis of knee: Secondary | ICD-10-CM

## 2020-01-20 ENCOUNTER — Other Ambulatory Visit: Payer: Self-pay | Admitting: Physician Assistant

## 2020-01-20 DIAGNOSIS — F419 Anxiety disorder, unspecified: Secondary | ICD-10-CM

## 2020-01-20 NOTE — Telephone Encounter (Signed)
Requested medications are due for refill today yes  Requested medications are on the active medication list yes  Last refill 10/6  Last visit May 2021 addressed this med/dx but also had physical in Sept but not addressed  Future visit scheduled no  Notes to clinic Not Delegated.

## 2020-01-31 MED ORDER — OXYCODONE-ACETAMINOPHEN 7.5-325 MG PO TABS: 1.0000 | ORAL_TABLET | Freq: Four times a day (QID) | ORAL | 0 refills | Status: DC | PRN

## 2020-01-31 NOTE — Addendum Note (Signed)
Addended by: Margaretann Loveless on: 01/31/2020 04:55 PM   Modules accepted: Orders

## 2020-02-09 ENCOUNTER — Encounter: Payer: Self-pay | Admitting: Physician Assistant

## 2020-02-09 DIAGNOSIS — K8020 Calculus of gallbladder without cholecystitis without obstruction: Secondary | ICD-10-CM

## 2020-02-21 ENCOUNTER — Other Ambulatory Visit: Payer: Self-pay

## 2020-02-21 ENCOUNTER — Encounter: Payer: Self-pay | Admitting: Surgery

## 2020-02-21 ENCOUNTER — Ambulatory Visit (INDEPENDENT_AMBULATORY_CARE_PROVIDER_SITE_OTHER): Payer: 59 | Admitting: Surgery

## 2020-02-21 VITALS — BP 166/90 | HR 82 | Temp 98.7°F | Ht 69.5 in | Wt 258.8 lb

## 2020-02-21 DIAGNOSIS — K8020 Calculus of gallbladder without cholecystitis without obstruction: Secondary | ICD-10-CM

## 2020-02-21 NOTE — Patient Instructions (Signed)
Dr Everlene Farrier suggested patient to try over the counter Miralax ( 2 full caps) to help with constipation. Dr Everlene Farrier discussed the surgical treatment and risk factors with patient at today's visit. Our surgery scheduler Britta Mccreedy will contact you within the next 24-48 hours to discuss the preparation prior to surgery and also discuss the times to align with your schedule. Please have the BLUE sheet when she contacts you. If you have any questions or concerns, please do not hesitate to give our office a call.  Laparoscopic Cholecystectomy Laparoscopic cholecystectomy is surgery to remove the gallbladder. The gallbladder is a pear-shaped organ that lies beneath the liver on the right side of the body. The gallbladder stores bile, which is a fluid that helps the body to digest fats. Cholecystectomy is often done for inflammation of the gallbladder (cholecystitis). This condition is usually caused by a buildup of gallstones (cholelithiasis) in the gallbladder. Gallstones can block the flow of bile, which can result in inflammation and pain. In severe cases, emergency surgery may be required. This procedure is done though small incisions in your abdomen (laparoscopic surgery). A thin scope with a camera (laparoscope) is inserted through one incision. Thin surgical instruments are inserted through the other incisions. In some cases, a laparoscopic procedure may be turned into a type of surgery that is done through a larger incision (open surgery). Tell a health care provider about:  Any allergies you have.  All medicines you are taking, including vitamins, herbs, eye drops, creams, and over-the-counter medicines.  Any problems you or family members have had with anesthetic medicines.  Any blood disorders you have.  Any surgeries you have had.  Any medical conditions you have.  Whether you are pregnant or may be pregnant. What are the risks? Generally, this is a safe procedure. However, problems may occur,  including:  Infection.  Bleeding.  Allergic reactions to medicines.  Damage to other structures or organs.  A stone remaining in the common bile duct. The common bile duct carries bile from the gallbladder into the small intestine.  A bile leak from the cyst duct that is clipped when your gallbladder is removed. What happens before the procedure? Staying hydrated Follow instructions from your health care provider about hydration, which may include:  Up to 2 hours before the procedure - you may continue to drink clear liquids, such as water, clear fruit juice, black coffee, and plain tea. Eating and drinking restrictions Follow instructions from your health care provider about eating and drinking, which may include:  8 hours before the procedure - stop eating heavy meals or foods such as meat, fried foods, or fatty foods.  6 hours before the procedure - stop eating light meals or foods, such as toast or cereal.  6 hours before the procedure - stop drinking milk or drinks that contain milk.  2 hours before the procedure - stop drinking clear liquids. Medicines  Ask your health care provider about: ? Changing or stopping your regular medicines. This is especially important if you are taking diabetes medicines or blood thinners. ? Taking medicines such as aspirin and ibuprofen. These medicines can thin your blood. Do not take these medicines before your procedure if your health care provider instructs you not to.  You may be given antibiotic medicine to help prevent infection. General instructions  Let your health care provider know if you develop a cold or an infection before surgery.  Plan to have someone take you home from the hospital or clinic.  Ask your health care provider how your surgical site will be marked or identified. What happens during the procedure?   To reduce your risk of infection: ? Your health care team will wash or sanitize their hands. ? Your skin  will be washed with soap. ? Hair may be removed from the surgical area.  An IV tube may be inserted into one of your veins.  You will be given one or more of the following: ? A medicine to help you relax (sedative). ? A medicine to make you fall asleep (general anesthetic).  A breathing tube will be placed in your mouth.  Your surgeon will make several small cuts (incisions) in your abdomen.  The laparoscope will be inserted through one of the small incisions. The camera on the laparoscope will send images to a TV screen (monitor) in the operating room. This lets your surgeon see inside your abdomen.  Air-like gas will be pumped into your abdomen. This will expand your abdomen to give the surgeon more room to perform the surgery.  Other tools that are needed for the procedure will be inserted through the other incisions. The gallbladder will be removed through one of the incisions.  Your common bile duct may be examined. If stones are found in the common bile duct, they may be removed.  After your gallbladder has been removed, the incisions will be closed with stitches (sutures), staples, or skin glue.  Your incisions may be covered with a bandage (dressing). The procedure may vary among health care providers and hospitals. What happens after the procedure?  Your blood pressure, heart rate, breathing rate, and blood oxygen level will be monitored until the medicines you were given have worn off.  You will be given medicines as needed to control your pain.  Do not drive for 24 hours if you were given a sedative. This information is not intended to replace advice given to you by your health care provider. Make sure you discuss any questions you have with your health care provider. Document Revised: 02/14/2017 Document Reviewed: 08/21/2015 Elsevier Patient Education  2020 [employer].

## 2020-02-22 ENCOUNTER — Telehealth: Payer: Self-pay | Admitting: Surgery

## 2020-02-22 ENCOUNTER — Encounter: Payer: Self-pay | Admitting: Surgery

## 2020-02-22 ENCOUNTER — Other Ambulatory Visit: Payer: Self-pay | Admitting: Physician Assistant

## 2020-02-22 DIAGNOSIS — K219 Gastro-esophageal reflux disease without esophagitis: Secondary | ICD-10-CM

## 2020-02-22 NOTE — H&P (View-Only) (Signed)
Outpatient Surgical Follow Up  02/22/2020  Kristin Aguirre is an 59 y.o. female.   Chief Complaint  Patient presents with  . Follow-up    last seen 2021 ref [name] PA gallbladder pt requested time    HPI: Kristin Aguirre is a 59 year old female seen in consultation at the request of Mrs . [name] [facility] for symptomatic cholelithiasis.  Reports epigastric and right upper quadrant pain for the last couple months.  She states that usually the indigestion and pain starts about 20 minutes after having a meal.  Feels that the pain is dull moderate intensity and get worse with some heavy meals.  No fevers no chills no evidence of biliary obstruction no evidence of jaundice. Also reports that her pain radiates to the back.  He denies any vomiting. She does have GERD but is controlled w PPI She is able to perform more than 4 METS w/o SOB or C/P. Did have an ultrasound that I have personally reviewed showing evidence of cholelithiasis with fatty liver.  No evidence of common bile duct obstruction or dilation BMP and CBC were normal. The prior abdominal intervention is hysterectomy via Pfannenstiel incision   Past Medical History:  Diagnosis Date  . Allergy   . Anxiety   . Diffuse cystic mastopathy   . High cholesterol     Past Surgical History:  Procedure Laterality Date  . ABDOMINAL HYSTERECTOMY  2003  . BREAST BIOPSY Left 2011, 2015   neg  . 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  Marland Kitchen cervical spine repair     . TONSILLECTOMY      Family History  Problem Relation Age of Onset  . Heart block Mother   . Cancer Mother        on leg  . Diabetes Mother   . Heart disease Mother   . Diabetes Father   . Heart disease Father   . CVA Father   . Asthma Sister   . Anemia Sister   . Seizures Brother   . Breast cancer Maternal  Grandmother 19    Social History:  reports that she quit smoking about 3 months ago. She started smoking about 7 years ago. She smoked 0.25 packs per day. She has never used smokeless tobacco. She reports current alcohol use. She reports that she does not use drugs.  Allergies:  Allergies  Allergen Reactions  . Furosemide Rash    Medications reviewed.    ROS Full ROS performed and is otherwise negative other than what is stated in HPI   BP (!) 166/90   Pulse 82   Temp 98.7 F (37.1 C) (Oral)   Ht 5' 9.5" (1.765 m)   Wt 258 lb 12.8 oz (117.4 kg)   LMP 03/18/2001 (Approximate)   SpO2 94%   BMI 37.67 kg/m   Physical Exam Vitals and nursing note reviewed. Exam conducted with a chaperone present.  Constitutional:      General: She is not in acute distress.    Appearance: She is obese.  Eyes:     General: No scleral icterus.       Right eye: No discharge.        Left eye: No discharge.  Cardiovascular:     Rate and Rhythm: Normal rate and regular rhythm.     Heart sounds: No murmur heard.   Pulmonary:     Effort: Pulmonary effort is normal.  No respiratory distress.     Breath sounds: Normal breath sounds. No stridor. No rhonchi.  Abdominal:     General: Abdomen is flat. There is no distension.     Palpations: There is no mass.     Tenderness: There is no abdominal tenderness. There is no guarding or rebound.     Hernia: No hernia is present.  Musculoskeletal:     Cervical back: Normal range of motion and neck supple. No rigidity or tenderness.  Skin:    General: Skin is warm and dry.     Capillary Refill: Capillary refill takes less than 2 seconds.  Neurological:     General: No focal deficit present.     Mental Status: She is alert and oriented to person, place, and time.  Psychiatric:        Mood and Affect: Mood normal.        Behavior: Behavior normal.        Thought Content: Thought content normal.        Judgment: Judgment normal.      Assessment/Plan: 59-year-old female with biliary colic.  Cussed with patient detail about her disease process.  I do recommend cholecystectomy and I do think that she will be a good candidate for robotic approach. I discussed the procedure in detail.  The patient was given educational material.  We discussed the risks and benefits of a laparoscopic cholecystectomy and possible cholangiogram including, but not limited to bleeding, infection, injury to surrounding structures such as the intestine or liver, bile leak, retained gallstones, need to convert to an open procedure, prolonged diarrhea, blood clots such as  DVT, common bile duct injury, anesthesia risks, and possible need for additional procedures.  The likelihood of improvement in symptoms and return to the patient's normal status is good. We discussed the typical post-operative recovery course.  A copy of this report was sent to the referring provider  Charm Stenner, MD FACS General Surgeon 

## 2020-02-22 NOTE — Progress Notes (Signed)
Outpatient Surgical Follow Up  02/22/2020  Kristin Aguirre is an 59 y.o. female.   Chief Complaint  Patient presents with  . Follow-up    last seen 2021 ref [name] PA gallbladder pt requested time    HPI: Kristin Aguirre is a 59 year old female seen in consultation at the request of Mrs . [name] [facility] for symptomatic cholelithiasis.  Reports epigastric and right upper quadrant pain for the last couple months.  She states that usually the indigestion and pain starts about 20 minutes after having a meal.  Feels that the pain is dull moderate intensity and get worse with some heavy meals.  No fevers no chills no evidence of biliary obstruction no evidence of jaundice. Also reports that her pain radiates to the back.  He denies any vomiting. She does have GERD but is controlled w PPI She is able to perform more than 4 METS w/o SOB or C/P. Did have an ultrasound that I have personally reviewed showing evidence of cholelithiasis with fatty liver.  No evidence of common bile duct obstruction or dilation BMP and CBC were normal. The prior abdominal intervention is hysterectomy via Pfannenstiel incision   Past Medical History:  Diagnosis Date  . Allergy   . Anxiety   . Diffuse cystic mastopathy   . High cholesterol     Past Surgical History:  Procedure Laterality Date  . ABDOMINAL HYSTERECTOMY  2003  . BREAST BIOPSY Left 2011, 2015   neg  . 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  Marland Kitchen cervical spine repair     . TONSILLECTOMY      Family History  Problem Relation Age of Onset  . Heart block Mother   . Cancer Mother        on leg  . Diabetes Mother   . Heart disease Mother   . Diabetes Father   . Heart disease Father   . CVA Father   . Asthma Sister   . Anemia Sister   . Seizures Brother   . Breast cancer Maternal  Grandmother 19    Social History:  reports that she quit smoking about 3 months ago. She started smoking about 7 years ago. She smoked 0.25 packs per day. She has never used smokeless tobacco. She reports current alcohol use. She reports that she does not use drugs.  Allergies:  Allergies  Allergen Reactions  . Furosemide Rash    Medications reviewed.    ROS Full ROS performed and is otherwise negative other than what is stated in HPI   BP (!) 166/90   Pulse 82   Temp 98.7 F (37.1 C) (Oral)   Ht 5' 9.5" (1.765 m)   Wt 258 lb 12.8 oz (117.4 kg)   LMP 03/18/2001 (Approximate)   SpO2 94%   BMI 37.67 kg/m   Physical Exam Vitals and nursing note reviewed. Exam conducted with a chaperone present.  Constitutional:      General: She is not in acute distress.    Appearance: She is obese.  Eyes:     General: No scleral icterus.       Right eye: No discharge.        Left eye: No discharge.  Cardiovascular:     Rate and Rhythm: Normal rate and regular rhythm.     Heart sounds: No murmur heard.   Pulmonary:     Effort: Pulmonary effort is normal.  No respiratory distress.     Breath sounds: Normal breath sounds. No stridor. No rhonchi.  Abdominal:     General: Abdomen is flat. There is no distension.     Palpations: There is no mass.     Tenderness: There is no abdominal tenderness. There is no guarding or rebound.     Hernia: No hernia is present.  Musculoskeletal:     Cervical back: Normal range of motion and neck supple. No rigidity or tenderness.  Skin:    General: Skin is warm and dry.     Capillary Refill: Capillary refill takes less than 2 seconds.  Neurological:     General: No focal deficit present.     Mental Status: She is alert and oriented to person, place, and time.  Psychiatric:        Mood and Affect: Mood normal.        Behavior: Behavior normal.        Thought Content: Thought content normal.        Judgment: Judgment normal.      Assessment/Plan: 59 year old female with biliary colic.  Cussed with patient detail about her disease process.  I do recommend cholecystectomy and I do think that she will be a good candidate for robotic approach. I discussed the procedure in detail.  The patient was given Agricultural engineer.  We discussed the risks and benefits of a laparoscopic cholecystectomy and possible cholangiogram including, but not limited to bleeding, infection, injury to surrounding structures such as the intestine or liver, bile leak, retained gallstones, need to convert to an open procedure, prolonged diarrhea, blood clots such as  DVT, common bile duct injury, anesthesia risks, and possible need for additional procedures.  The likelihood of improvement in symptoms and return to the patient's normal status is good. We discussed the typical post-operative recovery course.  A copy of this report was sent to the referring provider  Sterling Big, MD [facility] General Surgeon

## 2020-02-22 NOTE — Telephone Encounter (Signed)
Patient has been advised of Pre-Admission date/time, COVID Testing date and Surgery date.  Surgery Date: 03/03/20 Preadmission Testing Date: 02/28/20 (phone 8a-1p) Covid Testing Date: 03/01/20 - patient advised to [facility] ([address] [facility]) between 8a-1p   Patient has been made aware to call [phone], between 1-3:00pm the day before surgery, to find out what time to arrive for surgery.

## 2020-02-25 ENCOUNTER — Other Ambulatory Visit: Payer: Self-pay | Admitting: Physician Assistant

## 2020-02-25 DIAGNOSIS — M170 Bilateral primary osteoarthritis of knee: Secondary | ICD-10-CM

## 2020-02-25 DIAGNOSIS — M545 Low back pain, unspecified: Secondary | ICD-10-CM

## 2020-02-25 DIAGNOSIS — G8929 Other chronic pain: Secondary | ICD-10-CM

## 2020-02-25 MED ORDER — OXYCODONE-ACETAMINOPHEN 7.5-325 MG PO TABS: 1.0000 | ORAL_TABLET | Freq: Four times a day (QID) | ORAL | 0 refills | Status: DC | PRN

## 2020-02-28 ENCOUNTER — Encounter
Admission: RE | Admit: 2020-02-28 | Discharge: 2020-02-28 | Disposition: A | Discharge status: Home / Self Care | Payer: 59 | Source: Ambulatory Visit | Attending: Surgery | Admitting: Surgery

## 2020-02-28 ENCOUNTER — Other Ambulatory Visit: Payer: Self-pay

## 2020-02-28 HISTORY — DX: Gastro-esophageal reflux disease without esophagitis: K21.9

## 2020-02-28 HISTORY — DX: Other specified postprocedural states: Z98.890

## 2020-02-28 HISTORY — DX: Other specified postprocedural states: R11.2

## 2020-02-28 NOTE — Patient Instructions (Signed)
Your procedure is scheduled on: March 03, 2020 Friday  Report to the Registration Desk on the 1st floor of the [facility]. To find out your arrival time, please call [phone] between 1PM - 3PM on: March 02, 2020 Thursday   REMEMBER: Instructions that are not followed completely may result in serious medical risk, up to and including death; or upon the discretion of your surgeon and anesthesiologist your surgery may need to be rescheduled.  Do not eat food after midnight the night before surgery.  No gum chewing, lozengers or hard candies.  You may however, drink CLEAR liquids up to 2 hours before you are scheduled to arrive for your surgery. Do not drink anything within 2 hours of your scheduled arrival time.  Clear liquids include: - water  - apple juice without pulp - gatorade (not RED, PURPLE, OR BLUE) - black coffee or tea (Do NOT add milk or creamers to the coffee or tea) Do NOT drink anything that is not on this list.  TAKE THESE MEDICATIONS THE MORNING OF SURGERY WITH A SIP OF WATER: XANAX IF NEEDED PAIN PILL IF NEEDED PROTONIX (take one the night before and one on the morning of surgery - helps to prevent nausea after surgery.)  FLONASE   One week prior to surgery: Stop Anti-inflammatories (NSAIDS) such as Advil, Aleve, Ibuprofen, Motrin, Naproxen, Naprosyn and  ASPIRIN OR Aspirin based products such as Excedrin, Goodys Powder, BC Powder. Stop ANY OVER THE COUNTER supplements until after surgery. (However, you may continue taking Vitamin D, Vitamin B, and multivitamin up until the day before surgery.)  No Alcohol for 24 hours before or after surgery.  No Smoking including e-cigarettes for 24 hours prior to surgery.  No chewable tobacco products for at least 6 hours prior to surgery.  No nicotine patches on the day of surgery.  Do not use any "recreational" drugs for at least a week prior to your surgery.  Please be advised that the combination of cocaine  and anesthesia may have negative outcomes, up to and including death. If you test positive for cocaine, your surgery will be cancelled.  On the morning of surgery brush your teeth with toothpaste and water, you may rinse your mouth with mouthwash if you wish. Do not swallow any toothpaste or mouthwash.  Do not wear jewelry, make-up, hairpins, clips or nail polish.  Do not wear lotions, powders, or perfumes.   Do not shave body from the neck down 48 hours prior to surgery just in case you cut yourself which could leave a site for infection.  Also, freshly shaved skin may become irritated if using the CHG soap.  Contact lenses, hearing aids and dentures may not be worn into surgery.  Do not bring valuables to the hospital. [facility] is not responsible for any missing/lost belongings or valuables.   Use CHG Soap as directed on instruction sheet.  Notify your doctor if there is any change in your medical condition (cold, fever, infection).  Wear comfortable clothing (specific to your surgery type) to the hospital.  Plan for stool softeners for home use; pain medications have a tendency to cause constipation. You can also help prevent constipation by eating foods high in fiber such as fruits and vegetables and drinking plenty of fluids as your diet allows.  After surgery, you can help prevent lung complications by doing breathing exercises.  Take deep breaths and cough every 1-2 hours. Your doctor may order a device called an Facilities manager to  help you take deep breaths. When coughing or sneezing, hold a pillow firmly against your incision with both hands. This is called "splinting." Doing this helps protect your incision. It also decreases belly discomfort.  If you are being discharged the day of surgery, you will not be allowed to drive home. You will need a responsible adult (18 years or older) to drive you home and stay with you that night.   Please call the Pre-admissions  Testing Dept. at [phone] if you have any questions about these instructions.  Visitation Policy:  Patients undergoing a surgery or procedure may have one family member or support person with them as long as that person is not COVID-19 positive or experiencing its symptoms.  That person may remain in the waiting area during the procedure.  Inpatient Visitation Update:   In an effort to ensure the safety of our team members and our patients, we are implementing a change to our visitation policy:  Effective Monday, Aug. 9, at 7 a.m., inpatients will be allowed one support person.  o The support person may change daily.  o The support person must pass our screening, gel in and out, and wear a mask at all times, including in the patient's room.  o Patients must also wear a mask when staff or their support person are in the room.  o Masking is required regardless of vaccination status.  Systemwide, no visitors 17 or younger.

## 2020-03-01 ENCOUNTER — Encounter
Admission: RE | Admit: 2020-03-01 | Discharge: 2020-03-01 | Disposition: A | Discharge status: Home / Self Care | Payer: 59 | Source: Ambulatory Visit | Attending: Surgery | Admitting: Surgery

## 2020-03-01 ENCOUNTER — Other Ambulatory Visit: Payer: Self-pay

## 2020-03-01 DIAGNOSIS — Z20822 Contact with and (suspected) exposure to covid-19: Secondary | ICD-10-CM | POA: Insufficient documentation

## 2020-03-01 DIAGNOSIS — I10 Essential (primary) hypertension: Secondary | ICD-10-CM | POA: Insufficient documentation

## 2020-03-01 DIAGNOSIS — Z01818 Encounter for other preprocedural examination: Secondary | ICD-10-CM | POA: Diagnosis not present

## 2020-03-01 LAB — CBC WITH DIFFERENTIAL/PLATELET
Abs Immature Granulocytes: 0.04 10*3/uL (ref 0.00–0.07)
Basophils Absolute: 0.1 10*3/uL (ref 0.0–0.1)
Basophils Relative: 1 %
Eosinophils Absolute: 0.4 10*3/uL (ref 0.0–0.5)
Eosinophils Relative: 4 %
HCT: 29.8 % — ABNORMAL LOW (ref 36.0–46.0)
Hemoglobin: 9.1 g/dL — ABNORMAL LOW (ref 12.0–15.0)
Immature Granulocytes: 1 %
Lymphocytes Relative: 23 %
Lymphs Abs: 1.9 10*3/uL (ref 0.7–4.0)
MCH: 25.6 pg — ABNORMAL LOW (ref 26.0–34.0)
MCHC: 30.5 g/dL (ref 30.0–36.0)
MCV: 83.9 fL (ref 80.0–100.0)
Monocytes Absolute: 0.4 10*3/uL (ref 0.1–1.0)
Monocytes Relative: 4 %
Neutro Abs: 5.5 10*3/uL (ref 1.7–7.7)
Neutrophils Relative %: 67 %
Platelets: 322 10*3/uL (ref 150–400)
RBC: 3.55 MIL/uL — ABNORMAL LOW (ref 3.87–5.11)
RDW: 13.3 % (ref 11.5–15.5)
WBC: 8.3 10*3/uL (ref 4.0–10.5)
nRBC: 0 % (ref 0.0–0.2)

## 2020-03-01 LAB — COMPREHENSIVE METABOLIC PANEL
ALT: 16 U/L (ref 0–44)
AST: 18 U/L (ref 15–41)
Albumin: 3.6 g/dL (ref 3.5–5.0)
Alkaline Phosphatase: 74 U/L (ref 38–126)
Anion gap: 8 (ref 5–15)
BUN: 14 mg/dL (ref 6–20)
CO2: 28 mmol/L (ref 22–32)
Calcium: 8.8 mg/dL — ABNORMAL LOW (ref 8.9–10.3)
Chloride: 102 mmol/L (ref 98–111)
Creatinine, Ser: 0.52 mg/dL (ref 0.44–1.00)
GFR, Estimated: >60 mL/min (ref >60)
Glucose, Bld: 95 mg/dL (ref 70–99)
Potassium: 3.7 mmol/L (ref 3.5–5.1)
Sodium: 138 mmol/L (ref 135–145)
Total Bilirubin: 0.4 mg/dL (ref 0.3–1.2)
Total Protein: 7.3 g/dL (ref 6.5–8.1)

## 2020-03-01 LAB — SARS CORONAVIRUS 2 (TAT 6-24 HRS): SARS Coronavirus 2: NEGATIVE

## 2020-03-01 NOTE — Progress Notes (Signed)
  [facility] Perioperative Services: Pre-Admission/Anesthesia Testing  Abnormal Lab Notification   Date: 03/01/20  Name: Kristin Aguirre MRN:   [id number]  Re: Abnormal labs noted during PAT appointment   Provider(s) Notified: Leafy Ro, MD Notification mode: Routed and/or faxed via [facility]   ABNORMAL LAB VALUE(S): Lab Results  Component Value Date   HGB 9.1 (L) 03/01/2020   HCT 29.8 (L) 03/01/2020   Notes:  Patient is scheduled for a robot assist laparoscopic cholecystectomy on 03/03/2020. PMH is not significant for anemia. RBC indices demonstrate a slight hypochromia with an MCH of 25.6 pg; MCV WNL at 83.9 fL. Will add order for type and screen to be performed on the day of surgery. This is a Personal assistant; no formal response is required.  [name], MSN, APRN, FNP-C, CEN [facility]  Peri-operative Services Nurse Practitioner Phone: [phone] Fax: [phone] 03/01/20 1:08 PM

## 2020-03-02 ENCOUNTER — Encounter: Payer: Self-pay | Admitting: Physician Assistant

## 2020-03-02 DIAGNOSIS — R600 Localized edema: Secondary | ICD-10-CM

## 2020-03-03 ENCOUNTER — Other Ambulatory Visit: Payer: Self-pay

## 2020-03-03 ENCOUNTER — Ambulatory Visit: Payer: No Typology Code available for payment source | Admitting: Urgent Care

## 2020-03-03 ENCOUNTER — Ambulatory Visit
Admission: RE | Admit: 2020-03-03 | Discharge: 2020-03-03 | Disposition: A | Discharge status: Home / Self Care | Payer: No Typology Code available for payment source | Attending: Surgery | Admitting: Surgery

## 2020-03-03 ENCOUNTER — Encounter: Payer: Self-pay | Admitting: Surgery

## 2020-03-03 ENCOUNTER — Encounter
Admission: RE | Disposition: A | Discharge status: Home / Self Care | Payer: Self-pay | Source: Home / Self Care | Attending: Surgery

## 2020-03-03 DIAGNOSIS — K8064 Calculus of gallbladder and bile duct with chronic cholecystitis without obstruction: Secondary | ICD-10-CM

## 2020-03-03 DIAGNOSIS — Z87891 Personal history of nicotine dependence: Secondary | ICD-10-CM | POA: Diagnosis not present

## 2020-03-03 DIAGNOSIS — Z823 Family history of stroke: Secondary | ICD-10-CM | POA: Insufficient documentation

## 2020-03-03 DIAGNOSIS — Z809 Family history of malignant neoplasm, unspecified: Secondary | ICD-10-CM | POA: Insufficient documentation

## 2020-03-03 DIAGNOSIS — K828 Other specified diseases of gallbladder: Secondary | ICD-10-CM | POA: Diagnosis not present

## 2020-03-03 DIAGNOSIS — K8020 Calculus of gallbladder without cholecystitis without obstruction: Secondary | ICD-10-CM

## 2020-03-03 DIAGNOSIS — Z888 Allergy status to other drugs, medicaments and biological substances status: Secondary | ICD-10-CM | POA: Insufficient documentation

## 2020-03-03 DIAGNOSIS — I10 Essential (primary) hypertension: Secondary | ICD-10-CM | POA: Diagnosis not present

## 2020-03-03 DIAGNOSIS — K760 Fatty (change of) liver, not elsewhere classified: Secondary | ICD-10-CM | POA: Insufficient documentation

## 2020-03-03 DIAGNOSIS — Z803 Family history of malignant neoplasm of breast: Secondary | ICD-10-CM | POA: Diagnosis not present

## 2020-03-03 DIAGNOSIS — K8010 Calculus of gallbladder with chronic cholecystitis without obstruction: Secondary | ICD-10-CM | POA: Diagnosis not present

## 2020-03-03 DIAGNOSIS — K219 Gastro-esophageal reflux disease without esophagitis: Secondary | ICD-10-CM | POA: Insufficient documentation

## 2020-03-03 DIAGNOSIS — Z820 Family history of epilepsy and other diseases of the nervous system: Secondary | ICD-10-CM | POA: Diagnosis not present

## 2020-03-03 DIAGNOSIS — Z833 Family history of diabetes mellitus: Secondary | ICD-10-CM | POA: Insufficient documentation

## 2020-03-03 DIAGNOSIS — Z8249 Family history of ischemic heart disease and other diseases of the circulatory system: Secondary | ICD-10-CM | POA: Insufficient documentation

## 2020-03-03 DIAGNOSIS — K8060 Calculus of gallbladder and bile duct with cholecystitis, unspecified, without obstruction: Secondary | ICD-10-CM | POA: Diagnosis present

## 2020-03-03 LAB — TYPE AND SCREEN
ABO/RH(D): A NEG
Antibody Screen: NEGATIVE

## 2020-03-03 LAB — ABO/RH: ABO/RH(D): A NEG

## 2020-03-03 SURGERY — CHOLECYSTECTOMY, ROBOT-ASSISTED, LAPAROSCOPIC
Anesthesia: General

## 2020-03-03 MED ORDER — PROPOFOL 10 MG/ML IV BOLUS
INTRAVENOUS | Status: DC | PRN
  Administered 2020-03-03: 30 mg via INTRAVENOUS
  Administered 2020-03-03: 50 mg via INTRAVENOUS
  Administered 2020-03-03: 120 mg via INTRAVENOUS

## 2020-03-03 MED ORDER — ONDANSETRON HCL 4 MG PO TABS: 4.0000 mg | ORAL_TABLET | Freq: Three times a day (TID) | ORAL | 0 refills | Status: DC | PRN

## 2020-03-03 MED ORDER — SUCCINYLCHOLINE CHLORIDE 20 MG/ML IJ SOLN
INTRAMUSCULAR | Status: DC | PRN
  Administered 2020-03-03: 120 mg via INTRAVENOUS

## 2020-03-03 MED ORDER — BUPIVACAINE-EPINEPHRINE (PF) 0.25% -1:200000 IJ SOLN
INTRAMUSCULAR | Status: DC | PRN
  Administered 2020-03-03: 30 mL

## 2020-03-03 MED ORDER — SUGAMMADEX SODIUM 200 MG/2ML IV SOLN
INTRAVENOUS | Status: DC | PRN
  Administered 2020-03-03: 200 mg via INTRAVENOUS

## 2020-03-03 MED ORDER — FENTANYL CITRATE (PF) 100 MCG/2ML IJ SOLN
INTRAMUSCULAR | Status: AC
  Administered 2020-03-03: 25 ug via INTRAVENOUS
  Filled 2020-03-03: qty 2

## 2020-03-03 MED ORDER — ONDANSETRON HCL 4 MG/2ML IJ SOLN
INTRAMUSCULAR | Status: AC
  Filled 2020-03-03: qty 2

## 2020-03-03 MED ORDER — PROMETHAZINE HCL 25 MG/ML IJ SOLN: 6.2500 mg | INTRAMUSCULAR | Status: DC | PRN

## 2020-03-03 MED ORDER — CHLORHEXIDINE GLUCONATE CLOTH 2 % EX PADS: 6.0000 | MEDICATED_PAD | Freq: Once | CUTANEOUS | Status: DC

## 2020-03-03 MED ORDER — CEFAZOLIN SODIUM-DEXTROSE 2-4 GM/100ML-% IV SOLN
INTRAVENOUS | Status: AC
  Filled 2020-03-03: qty 100

## 2020-03-03 MED ORDER — MIDAZOLAM HCL 2 MG/2ML IJ SOLN
INTRAMUSCULAR | Status: DC | PRN
  Administered 2020-03-03: 2 mg via INTRAVENOUS

## 2020-03-03 MED ORDER — LACTATED RINGERS IV SOLN: INTRAVENOUS | Status: DC

## 2020-03-03 MED ORDER — CELECOXIB 200 MG PO CAPS: 200.0000 mg | ORAL_CAPSULE | ORAL | Status: AC

## 2020-03-03 MED ORDER — CHLORHEXIDINE GLUCONATE 0.12 % MT SOLN
OROMUCOSAL | Status: AC
  Administered 2020-03-03: 15 mL via OROMUCOSAL
  Filled 2020-03-03: qty 15

## 2020-03-03 MED ORDER — HYDROCODONE-ACETAMINOPHEN 5-325 MG PO TABS: 1.0000 | ORAL_TABLET | ORAL | 0 refills | Status: DC | PRN

## 2020-03-03 MED ORDER — FENTANYL CITRATE (PF) 250 MCG/5ML IJ SOLN
INTRAMUSCULAR | Status: AC
  Filled 2020-03-03: qty 5

## 2020-03-03 MED ORDER — ORAL CARE MOUTH RINSE: 15.0000 mL | Freq: Once | OROMUCOSAL | Status: AC

## 2020-03-03 MED ORDER — ACETAMINOPHEN 500 MG PO TABS
ORAL_TABLET | ORAL | Status: AC
  Administered 2020-03-03: 1000 mg via ORAL
  Filled 2020-03-03: qty 2

## 2020-03-03 MED ORDER — PROMETHAZINE HCL 25 MG/ML IJ SOLN
INTRAMUSCULAR | Status: AC
  Filled 2020-03-03: qty 1

## 2020-03-03 MED ORDER — ONDANSETRON HCL 4 MG/2ML IJ SOLN
INTRAMUSCULAR | Status: DC | PRN
  Administered 2020-03-03 (×2): 4 mg via INTRAVENOUS

## 2020-03-03 MED ORDER — PROPOFOL 10 MG/ML IV BOLUS
INTRAVENOUS | Status: AC
  Filled 2020-03-03: qty 20

## 2020-03-03 MED ORDER — DEXAMETHASONE SODIUM PHOSPHATE 10 MG/ML IJ SOLN
INTRAMUSCULAR | Status: DC | PRN
  Administered 2020-03-03: 10 mg via INTRAVENOUS

## 2020-03-03 MED ORDER — SCOPOLAMINE 1 MG/3DAYS TD PT72
MEDICATED_PATCH | TRANSDERMAL | Status: AC
  Filled 2020-03-03: qty 1

## 2020-03-03 MED ORDER — ROCURONIUM BROMIDE 100 MG/10ML IV SOLN
INTRAVENOUS | Status: DC | PRN
  Administered 2020-03-03: 30 mg via INTRAVENOUS
  Administered 2020-03-03: 20 mg via INTRAVENOUS
  Administered 2020-03-03: 5 mg via INTRAVENOUS

## 2020-03-03 MED ORDER — FENTANYL CITRATE (PF) 100 MCG/2ML IJ SOLN
INTRAMUSCULAR | Status: DC | PRN
  Administered 2020-03-03 (×5): 50 ug via INTRAVENOUS

## 2020-03-03 MED ORDER — KETOROLAC TROMETHAMINE 30 MG/ML IJ SOLN
INTRAMUSCULAR | Status: DC | PRN
  Administered 2020-03-03: 30 mg via INTRAVENOUS

## 2020-03-03 MED ORDER — VITAMIN D (ERGOCALCIFEROL) 1.25 MG (50000 UNIT) PO CAPS: 50000.0000 [IU] | ORAL_CAPSULE | ORAL | 1 refills | Status: DC

## 2020-03-03 MED ORDER — MIDAZOLAM HCL 2 MG/2ML IJ SOLN
INTRAMUSCULAR | Status: AC
  Filled 2020-03-03: qty 2

## 2020-03-03 MED ORDER — ACETAMINOPHEN 500 MG PO TABS: 1000.0000 mg | ORAL_TABLET | ORAL | Status: AC

## 2020-03-03 MED ORDER — GABAPENTIN 300 MG PO CAPS
ORAL_CAPSULE | ORAL | Status: AC
  Administered 2020-03-03: 300 mg via ORAL
  Filled 2020-03-03: qty 1

## 2020-03-03 MED ORDER — LIDOCAINE HCL (CARDIAC) PF 100 MG/5ML IV SOSY
PREFILLED_SYRINGE | INTRAVENOUS | Status: DC | PRN
  Administered 2020-03-03: 100 mg via INTRAVENOUS

## 2020-03-03 MED ORDER — BUPIVACAINE-EPINEPHRINE (PF) 0.25% -1:200000 IJ SOLN
INTRAMUSCULAR | Status: AC
  Filled 2020-03-03: qty 30

## 2020-03-03 MED ORDER — SODIUM CHLORIDE FLUSH 0.9 % IV SOLN
INTRAVENOUS | Status: AC
  Filled 2020-03-03: qty 10

## 2020-03-03 MED ORDER — SCOPOLAMINE 1 MG/3DAYS TD PT72
1.0000 | MEDICATED_PATCH | Freq: Once | TRANSDERMAL | Status: DC
  Administered 2020-03-03: 1.5 mg via TRANSDERMAL

## 2020-03-03 MED ORDER — CHLORHEXIDINE GLUCONATE 0.12 % MT SOLN: 15.0000 mL | Freq: Once | OROMUCOSAL | Status: AC

## 2020-03-03 MED ORDER — INDOCYANINE GREEN 25 MG IV SOLR
7.5000 mg | Freq: Once | INTRAVENOUS | Status: AC
  Administered 2020-03-03: 7.5 mg via INTRAVENOUS
  Filled 2020-03-03: qty 3

## 2020-03-03 MED ORDER — CELECOXIB 200 MG PO CAPS
ORAL_CAPSULE | ORAL | Status: AC
  Administered 2020-03-03: 200 mg via ORAL
  Filled 2020-03-03: qty 1

## 2020-03-03 MED ORDER — PHENYLEPHRINE HCL (PRESSORS) 10 MG/ML IV SOLN
INTRAVENOUS | Status: DC | PRN
  Administered 2020-03-03: 100 ug via INTRAVENOUS

## 2020-03-03 MED ORDER — GABAPENTIN 300 MG PO CAPS: 300.0000 mg | ORAL_CAPSULE | ORAL | Status: AC

## 2020-03-03 MED ORDER — CEFAZOLIN SODIUM-DEXTROSE 2-4 GM/100ML-% IV SOLN
2.0000 g | INTRAVENOUS | Status: AC
  Administered 2020-03-03: 2 g via INTRAVENOUS

## 2020-03-03 MED ORDER — FENTANYL CITRATE (PF) 100 MCG/2ML IJ SOLN
25.0000 ug | INTRAMUSCULAR | Status: DC | PRN
  Administered 2020-03-03 (×3): 25 ug via INTRAVENOUS

## 2020-03-03 MED ORDER — DEXAMETHASONE SODIUM PHOSPHATE 10 MG/ML IJ SOLN
INTRAMUSCULAR | Status: AC
  Filled 2020-03-03: qty 1

## 2020-03-03 MED ORDER — TRIAMTERENE-HCTZ 37.5-25 MG PO TABS: 1.0000 | ORAL_TABLET | Freq: Every day | ORAL | 3 refills | Status: DC

## 2020-03-03 MED ORDER — ROCURONIUM BROMIDE 10 MG/ML (PF) SYRINGE
PREFILLED_SYRINGE | INTRAVENOUS | Status: AC
  Filled 2020-03-03: qty 10

## 2020-03-03 MED ORDER — OXYCODONE HCL 5 MG PO TABS
ORAL_TABLET | ORAL | Status: AC
  Filled 2020-03-03: qty 1

## 2020-03-03 MED ORDER — OXYCODONE HCL 5 MG PO TABS
5.0000 mg | ORAL_TABLET | Freq: Once | ORAL | Status: AC
Start: 2020-03-03 — End: 2020-03-03
  Administered 2020-03-03: 5 mg via ORAL

## 2020-03-03 SURGICAL SUPPLY — 52 items
"PENCIL ELECTRO HAND CTR " (MISCELLANEOUS) ×1 IMPLANT
ADH SKN CLS APL DERMABOND .7 (GAUZE/BANDAGES/DRESSINGS) ×1
APL PRP STRL LF DISP 70% ISPRP (MISCELLANEOUS) ×1
BAG SPEC RTRVL LRG 6X4 10 (ENDOMECHANICALS) ×1
CANISTER SUCT 1200ML W/VALVE (MISCELLANEOUS) ×1 IMPLANT
CANNULA REDUC XI 12-8 STAPL (CANNULA) ×1
CANNULA REDUCER 12-8 DVNC XI (CANNULA) ×1 IMPLANT
CHLORAPREP W/TINT 26 (MISCELLANEOUS) ×2 IMPLANT
CLIP VESOLOCK MED LG 6/CT (CLIP) ×2 IMPLANT
COVER WAND RF STERILE (DRAPES) ×2 IMPLANT
DECANTER SPIKE VIAL GLASS SM (MISCELLANEOUS) ×2 IMPLANT
DEFOGGER SCOPE WARMER CLEARIFY (MISCELLANEOUS) ×2 IMPLANT
DERMABOND ADVANCED (GAUZE/BANDAGES/DRESSINGS) ×1
DERMABOND ADVANCED .7 DNX12 (GAUZE/BANDAGES/DRESSINGS) ×1 IMPLANT
DRAPE ARM DVNC X/XI (DISPOSABLE) ×4 IMPLANT
DRAPE COLUMN DVNC XI (DISPOSABLE) ×1 IMPLANT
DRAPE DA VINCI XI ARM (DISPOSABLE) ×4
DRAPE DA VINCI XI COLUMN (DISPOSABLE) ×1
ELECT CAUTERY BLADE 6.4 (BLADE) ×2 IMPLANT
ELECT REM PT RETURN 9FT ADLT (ELECTROSURGICAL) ×2
ELECTRODE REM PT RTRN 9FT ADLT (ELECTROSURGICAL) ×1 IMPLANT
GLOVE BIO SURGEON STRL SZ7 (GLOVE) ×4 IMPLANT
GOWN STRL REUS W/ TWL LRG LVL3 (GOWN DISPOSABLE) ×4 IMPLANT
GOWN STRL REUS W/TWL LRG LVL3 (GOWN DISPOSABLE) ×8
IRRIGATION STRYKERFLOW (MISCELLANEOUS) IMPLANT
IRRIGATOR STRYKERFLOW (MISCELLANEOUS)
IV NS 1000ML (IV SOLUTION)
IV NS 1000ML BAXH (IV SOLUTION) IMPLANT
KIT PINK PAD W/HEAD ARE REST (MISCELLANEOUS) ×2
KIT PINK PAD W/HEAD ARM REST (MISCELLANEOUS) ×1 IMPLANT
LABEL OR SOLS (LABEL) ×2 IMPLANT
MANIFOLD NEPTUNE II (INSTRUMENTS) ×2 IMPLANT
NEEDLE HYPO 22GX1.5 SAFETY (NEEDLE) ×2 IMPLANT
NS IRRIG 500ML POUR BTL (IV SOLUTION) ×2 IMPLANT
OBTURATOR OPTICAL STANDARD 8MM (TROCAR) ×1
OBTURATOR OPTICAL STND 8 DVNC (TROCAR) ×1
OBTURATOR OPTICALSTD 8 DVNC (TROCAR) ×1 IMPLANT
PACK LAP CHOLECYSTECTOMY (MISCELLANEOUS) ×2 IMPLANT
PENCIL ELECTRO HAND CTR (MISCELLANEOUS) ×2 IMPLANT
POUCH SPECIMEN RETRIEVAL 10MM (ENDOMECHANICALS) ×2 IMPLANT
SEAL CANN UNIV 5-8 DVNC XI (MISCELLANEOUS) ×3 IMPLANT
SEAL XI 5MM-8MM UNIVERSAL (MISCELLANEOUS) ×3
SET TUBE SMOKE EVAC HIGH FLOW (TUBING) ×2 IMPLANT
SOLUTION ELECTROLUBE (MISCELLANEOUS) ×2 IMPLANT
SPONGE LAP 18X18 RF (DISPOSABLE) ×2 IMPLANT
SPONGE LAP 4X18 RFD (DISPOSABLE) IMPLANT
STAPLER CANNULA SEAL DVNC XI (STAPLE) ×1 IMPLANT
STAPLER CANNULA SEAL XI (STAPLE) ×1
SUT MNCRL AB 4-0 PS2 18 (SUTURE) ×2 IMPLANT
SUT VICRYL 0 AB UR-6 (SUTURE) ×5 IMPLANT
TAPE TRANSPORE STRL 2 31045 (GAUZE/BANDAGES/DRESSINGS) ×2 IMPLANT
TROCAR BALLN GELPORT 12X130M (ENDOMECHANICALS) ×2 IMPLANT

## 2020-03-03 NOTE — OR Nursing (Signed)
Per Dr. Everlene Farrier, verbal patient may resume taking aspirin tomorrow.  Added to d/c instructions/med section.  He's aware 1st available appt per office is greater than 2 weeks, states three weeks will be okay.

## 2020-03-03 NOTE — Anesthesia Procedure Notes (Signed)
Procedure Name: Intubation Date/Time: 03/03/2020 10:17 AM Performed by: Rodney Booze, CRNA Pre-anesthesia Checklist: Patient identified, Emergency Drugs available, Suction available and Patient being monitored Patient Re-evaluated:Patient Re-evaluated prior to induction Oxygen Delivery Method: Circle system utilized Preoxygenation: Pre-oxygenation with 100% oxygen Induction Type: IV induction Ventilation: Mask ventilation without difficulty Laryngoscope Size: Miller and 2 Grade View: Grade I Tube type: Oral Number of attempts: 1 Airway Equipment and Method: Stylet and Oral airway Placement Confirmation: ETT inserted through vocal cords under direct vision,  positive ETCO2 and breath sounds checked- equal and bilateral Secured at: 19 cm Tube secured with: Tape Dental Injury: Teeth and Oropharynx as per pre-operative assessment

## 2020-03-03 NOTE — Anesthesia Postprocedure Evaluation (Signed)
Anesthesia Post Note  Patient: Development worker, international aid  Procedure(s) Performed: XI ROBOTIC ASSISTED LAPAROSCOPIC CHOLECYSTECTOMY (N/A )  Patient location during evaluation: PACU Anesthesia Type: General Level of consciousness: awake and alert Pain management: pain level controlled Vital Signs Assessment: post-procedure vital signs reviewed and stable Respiratory status: spontaneous breathing, nonlabored ventilation, respiratory function stable and patient connected to nasal cannula oxygen Cardiovascular status: blood pressure returned to baseline and stable Postop Assessment: no apparent nausea or vomiting Anesthetic complications: no   No complications documented.   Last Vitals:  Vitals:   03/03/20 1309 03/03/20 1333  BP: (!) 153/82 (!) 167/81  Pulse: 85 80  Resp: 15 16  Temp: 37.1 C   SpO2: 92% 96%    Last Pain:  Vitals:   03/03/20 1333  TempSrc:   PainSc: 8                  Lenard Simmer

## 2020-03-03 NOTE — Op Note (Signed)
Robotic assisted laparoscopic Cholecystectomy  Pre-operative Diagnosis: biliary colic  Post-operative Diagnosis: same  Procedure:  Robotic assisted laparoscopic Cholecystectomy  Surgeon: Sterling Big, MD FACS  Anesthesia: Gen. with endotracheal tube  Findings: Distended GB with large Gallstone Chronic mild Cholecystitis   Estimated Blood Loss: 5cc       Specimens: Gallbladder           Complications: none   Procedure Details  The patient was seen again in the Holding Room. The benefits, complications, treatment options, and expected outcomes were discussed with the patient. The risks of bleeding, infection, recurrence of symptoms, failure to resolve symptoms, bile duct damage, bile duct leak, retained common bile duct stone, bowel injury, any of which could require further surgery and/or ERCP, stent, or papillotomy were reviewed with the patient. The likelihood of improving the patient's symptoms with return to their baseline status is good.  The patient and/or family concurred with the proposed plan, giving informed consent.  The patient was taken to Operating Room, identified  and the procedure verified as Laparoscopic Cholecystectomy.  A Time Out was held and the above information confirmed.  Prior to the induction of general anesthesia, antibiotic prophylaxis was administered. VTE prophylaxis was in place. General endotracheal anesthesia was then administered and tolerated well. After the induction, the abdomen was prepped with Chloraprep and draped in the sterile fashion. The patient was positioned in the supine position.  Cut down technique was used to enter the abdominal cavity and a Hasson trochar was placed after two vicryl stitches were anchored to the fascia. Pneumoperitoneum was then created with CO2 and tolerated well without any adverse changes in the patient's vital signs.  Three 8-mm ports were placed under direct vision. All skin incisions  were infiltrated with a local  anesthetic agent before making the incision and placing the trocars.   The patient was positioned  in reverse Trendelenburg, robot was brought to the surgical field and docked in the standard fashion.  We made sure all the instrumentation was kept indirect view at all times and that there were no collision between the arms. I scrubbed out and went to the console.  The gallbladder was identified, the fundus grasped and retracted cephalad. Adhesions were lysed bluntly. The infundibulum was grasped and retracted laterally, exposing the peritoneum overlying the triangle of Calot. This was then divided and exposed in a blunt fashion. An extended critical view of the cystic duct and cystic artery was obtained.  The cystic duct was clearly identified and bluntly dissected.   Artery and duct were double clipped and divided. Using ICG cholangiography we visualize the cystic duct and CBD no injuries were seen. The gallbladder was taken from the gallbladder fossa in a retrograde fashion with the electrocautery.  Hemostasis was achieved with the electrocautery. nspection of the right upper quadrant was performed. No bleeding, bile duct injury or leak, or bowel injury was noted. Robotic instruments and robotic arms were undocked in the standard fashion.  I scrubbed back in.  The gallbladder was removed and placed in an Endocatch bag.   Pneumoperitoneum was released.  The periumbilical port site was closed with interrumpted 0 Vicryl sutures. 4-0 subcuticular Monocryl was used to close the skin. Dermabond was  applied.  The patient was then extubated and brought to the recovery room in stable condition. Sponge, lap, and needle counts were correct at closure and at the conclusion of the case.  Caroleen Hamman, MD, FACS

## 2020-03-03 NOTE — Anesthesia Preprocedure Evaluation (Signed)
Anesthesia Evaluation  Patient identified by MRN, date of birth, ID band Patient awake    Reviewed: Allergy & Precautions, H&P , NPO status , Patient's Chart, lab work & pertinent test results, reviewed documented beta blocker date and time   History of Anesthesia Complications (+) PONV and history of anesthetic complications  Airway Mallampati: III  TM Distance: >3 FB Neck ROM: full    Dental  (+) Dental Advidsory Given, Teeth Intact, Caps   Pulmonary neg shortness of breath, asthma , neg sleep apnea, neg recent URI, Current Smoker and Patient abstained from smoking.,    Pulmonary exam normal breath sounds clear to auscultation       Cardiovascular Exercise Tolerance: Good hypertension, (-) angina(-) Past MI and (-) Cardiac Stents Normal cardiovascular exam(-) dysrhythmias (-) Valvular Problems/Murmurs Rhythm:regular Rate:Normal     Neuro/Psych PSYCHIATRIC DISORDERS Anxiety negative neurological ROS     GI/Hepatic Neg liver ROS, GERD  ,  Endo/Other  negative endocrine ROS  Renal/GU negative Renal ROS  negative genitourinary   Musculoskeletal   Abdominal   Peds  Hematology negative hematology ROS (+)   Anesthesia Other Findings Past Medical History: No date: Allergy No date: Anxiety No date: Diffuse cystic mastopathy     Comment:  LEFT SIDE No date: GERD (gastroesophageal reflux disease) No date: High cholesterol No date: PONV (postoperative nausea and vomiting)   Reproductive/Obstetrics negative OB ROS                             Anesthesia Physical Anesthesia Plan  ASA: II  Anesthesia Plan: General   Post-op Pain Management:    Induction: Intravenous  PONV Risk Score and Plan: 3 and Ondansetron, Dexamethasone, Midazolam, Promethazine and Treatment may vary due to age or medical condition  Airway Management Planned: Oral ETT  Additional Equipment:   Intra-op Plan:    Post-operative Plan: Extubation in OR  Informed Consent: I have reviewed the patients History and Physical, chart, labs and discussed the procedure including the risks, benefits and alternatives for the proposed anesthesia with the patient or authorized representative who has indicated his/her understanding and acceptance.     Dental Advisory Given  Plan Discussed with: Anesthesiologist, CRNA and Surgeon  Anesthesia Plan Comments:         Anesthesia Quick Evaluation

## 2020-03-03 NOTE — Transfer of Care (Signed)
Immediate Anesthesia Transfer of Care Note  Patient: Kristin Aguirre  Procedure(s) Performed: XI ROBOTIC ASSISTED LAPAROSCOPIC CHOLECYSTECTOMY (N/A )  Patient Location: PACU  Anesthesia Type:General  Level of Consciousness: awake, alert  and oriented  Airway & Oxygen Therapy: Patient Spontanous Breathing and Patient connected to face mask oxygen  Post-op Assessment: Report given to RN and Post -op Vital signs reviewed and stable  Post vital signs: stable  Last Vitals:  Vitals Value Taken Time  BP 163/93 03/03/20 1154  Temp 36.5 C 03/03/20 1154  Pulse 83 03/03/20 1207  Resp 17 03/03/20 1207  SpO2 92 % 03/03/20 1207  Vitals shown include unvalidated device data.  Last Pain:  Vitals:   03/03/20 0852  TempSrc: Temporal  PainSc: 8       Patients Stated Pain Goal: 3 (03/03/20 8315)  Complications: No complications documented.

## 2020-03-03 NOTE — Discharge Instructions (Addendum)
Laparoscopic Cholecystectomy, Care After   These instructions give you information on caring for yourself after your procedure. Your doctor may also give you more specific instructions. Call your doctor if you have any problems or questions after your procedure.  HOME CARE  Change your bandages (dressings) as told by your doctor.  Keep the wound dry and clean. Wash the wound gently with soap and water. Pat the wound dry with a clean towel.  Do not take baths, swim, or use hot tubs for 2 weeks, or as told by your doctor.  Only take medicine as told by your doctor.  Eat a normal diet as told by your doctor.  Do not lift anything heavier than 10 pounds (4.5 kg) until your doctor says it is okay.  Do not play contact sports for 1 week, or as told by your doctor. GET HELP IF:  Your wound is red, puffy (swollen), or painful.  You have yellowish-white fluid (pus) coming from the wound.  You have fluid draining from the wound for more than 1 day.  You have a bad smell coming from the wound.  Your wound breaks open. GET HELP RIGHT AWAY IF:  You have trouble breathing.  You have chest pain.  You have a fever >101  You have pain in the shoulders (shoulder strap areas) that is getting worse.  You feel dizzy or pass out (faint).  You have severe belly (abdominal) pain.  You feel sick to your stomach (nauseous) or throw up (vomit) for more than 1 day.  AMBULATORY SURGERY  DISCHARGE INSTRUCTIONS   1) The drugs that you were given will stay in your system until tomorrow so for the next 24 hours you should not:  A) Drive an automobile B) Make any legal decisions C) Drink any alcoholic beverage   2) You may resume regular meals tomorrow.  Today it is better to start with liquids and gradually work up to solid foods.  You may eat anything you prefer, but it is better to start with liquids, then soup and crackers, and gradually work up to solid foods.   3) Please notify your doctor immediately  if you have any unusual bleeding, trouble breathing, redness and pain at the surgery site, drainage, fever, or pain not relieved by medication.    4) Additional Instructions: Ibuprofen up to 800 mg every 8 hours as needed ( take regularly for first few days)       Please contact your physician with any problems or Same Day Surgery at [phone], Monday through Friday 6 am to 4 pm, or [place] at [facility] number at [phone].

## 2020-03-03 NOTE — Interval H&P Note (Signed)
History and Physical Interval Note:  03/03/2020 9:43 AM  Kristin Aguirre  has presented today for surgery, with the diagnosis of Biliary colie.  The various methods of treatment have been discussed with the patient and family. After consideration of risks, benefits and other options for treatment, the patient has consented to  Procedure(s): XI ROBOTIC ASSISTED LAPAROSCOPIC CHOLECYSTECTOMY (N/A) as a surgical intervention.  The patient's history has been reviewed, patient examined, no change in status, stable for surgery.  I have reviewed the patient's chart and labs.  Questions were answered to the patient's satisfaction.     Lavon Horn F Brilynn Biasi

## 2020-03-06 LAB — SURGICAL PATHOLOGY

## 2020-03-07 ENCOUNTER — Encounter: Payer: Self-pay | Admitting: Physician Assistant

## 2020-03-22 ENCOUNTER — Encounter: Payer: Self-pay | Admitting: Physician Assistant

## 2020-03-22 DIAGNOSIS — M170 Bilateral primary osteoarthritis of knee: Secondary | ICD-10-CM

## 2020-03-22 DIAGNOSIS — G8929 Other chronic pain: Secondary | ICD-10-CM

## 2020-03-22 DIAGNOSIS — M545 Low back pain, unspecified: Secondary | ICD-10-CM

## 2020-03-22 MED ORDER — OXYCODONE-ACETAMINOPHEN 7.5-325 MG PO TABS: 1.0000 | ORAL_TABLET | Freq: Four times a day (QID) | ORAL | 0 refills | Status: DC | PRN

## 2020-03-22 NOTE — Addendum Note (Signed)
Addended by: Margaretann Loveless on: 03/22/2020 12:20 PM   Modules accepted: Orders

## 2020-03-27 ENCOUNTER — Encounter: Payer: Self-pay | Admitting: Physician Assistant

## 2020-03-27 ENCOUNTER — Other Ambulatory Visit: Payer: Self-pay

## 2020-03-27 ENCOUNTER — Telehealth (INDEPENDENT_AMBULATORY_CARE_PROVIDER_SITE_OTHER): Payer: No Typology Code available for payment source | Admitting: Surgery

## 2020-03-27 DIAGNOSIS — M7989 Other specified soft tissue disorders: Secondary | ICD-10-CM

## 2020-03-27 DIAGNOSIS — Z09 Encounter for follow-up examination after completed treatment for conditions other than malignant neoplasm: Secondary | ICD-10-CM

## 2020-03-27 MED ORDER — HYDROCHLOROTHIAZIDE 25 MG PO TABS: 25.0000 mg | ORAL_TABLET | Freq: Every day | ORAL | 0 refills | Status: DC

## 2020-03-27 NOTE — Progress Notes (Signed)
This service was provided via telemedicine.  The patient consented to the visit being carried via telemedicine. ( I called her cellphone) Patient's location: home Provider's location:  office People participating in this telemedicine visit: pt  Patient is a status post robotic cholecystectomy.  She is overall doing very well.  No fevers no chills she did have an episode of indigestion after eating pizza but that has subsided.  No jaundice.  Incisions are healing well per her report.  Thoroughly discussed with her in detail.  A/p no obvious complications she is doing well.  RTC as needed

## 2020-03-28 ENCOUNTER — Encounter: Payer: Self-pay | Admitting: Physician Assistant

## 2020-03-29 MED ORDER — ONDANSETRON HCL 4 MG PO TABS: 4.0000 mg | ORAL_TABLET | Freq: Three times a day (TID) | ORAL | 0 refills | Status: DC | PRN

## 2020-04-06 ENCOUNTER — Encounter: Payer: Self-pay | Admitting: Physician Assistant

## 2020-04-06 DIAGNOSIS — T3695XA Adverse effect of unspecified systemic antibiotic, initial encounter: Secondary | ICD-10-CM

## 2020-04-06 DIAGNOSIS — B379 Candidiasis, unspecified: Secondary | ICD-10-CM

## 2020-04-07 MED ORDER — FLUCONAZOLE 150 MG PO TABS: 150.0000 mg | ORAL_TABLET | Freq: Once | ORAL | 0 refills | Status: AC

## 2020-04-17 ENCOUNTER — Encounter: Payer: Self-pay | Admitting: Physician Assistant

## 2020-04-17 DIAGNOSIS — M170 Bilateral primary osteoarthritis of knee: Secondary | ICD-10-CM

## 2020-04-17 DIAGNOSIS — G8929 Other chronic pain: Secondary | ICD-10-CM

## 2020-04-17 DIAGNOSIS — M545 Low back pain, unspecified: Secondary | ICD-10-CM

## 2020-04-19 MED ORDER — OXYCODONE-ACETAMINOPHEN 7.5-325 MG PO TABS: 1.0000 | ORAL_TABLET | Freq: Four times a day (QID) | ORAL | 0 refills | Status: DC | PRN

## 2020-04-19 NOTE — Addendum Note (Signed)
Addended by: Margaretann Loveless on: 04/19/2020 05:50 PM   Modules accepted: Orders

## 2020-04-27 ENCOUNTER — Other Ambulatory Visit: Payer: Self-pay | Admitting: Physician Assistant

## 2020-04-27 DIAGNOSIS — E7800 Pure hypercholesterolemia, unspecified: Secondary | ICD-10-CM

## 2020-05-12 ENCOUNTER — Encounter: Payer: Self-pay | Admitting: Physician Assistant

## 2020-05-18 ENCOUNTER — Encounter: Payer: Self-pay | Admitting: Physician Assistant

## 2020-05-18 DIAGNOSIS — G8929 Other chronic pain: Secondary | ICD-10-CM

## 2020-05-18 DIAGNOSIS — M545 Low back pain, unspecified: Secondary | ICD-10-CM

## 2020-05-18 DIAGNOSIS — M170 Bilateral primary osteoarthritis of knee: Secondary | ICD-10-CM

## 2020-05-18 MED ORDER — OXYCODONE-ACETAMINOPHEN 7.5-325 MG PO TABS: 1.0000 | ORAL_TABLET | Freq: Four times a day (QID) | ORAL | 0 refills | Status: DC | PRN

## 2020-05-23 ENCOUNTER — Encounter: Payer: Self-pay | Admitting: Physician Assistant

## 2020-05-23 DIAGNOSIS — Z1211 Encounter for screening for malignant neoplasm of colon: Secondary | ICD-10-CM

## 2020-05-24 ENCOUNTER — Encounter: Payer: Self-pay | Admitting: Physician Assistant

## 2020-05-24 MED ORDER — FLUTICASONE PROPIONATE 50 MCG/ACT NA SUSP: 2.0000 | Freq: Every day | NASAL | 1 refills | Status: DC

## 2020-06-07 ENCOUNTER — Other Ambulatory Visit: Payer: Self-pay | Admitting: Physician Assistant

## 2020-06-07 DIAGNOSIS — G43009 Migraine without aura, not intractable, without status migrainosus: Secondary | ICD-10-CM

## 2020-06-07 NOTE — Telephone Encounter (Signed)
Requested medication (s) are due for refill today: no  Requested medication (s) are on the active medication list: yes  Last refill:  11/26/2019  Future visit scheduled: no  Notes to clinic:  this refill cannot be delegated   Requested Prescriptions  Pending Prescriptions Disp Refills   butalbital-acetaminophen-caffeine (FIORICET) 50-325-40 MG tablet [Pharmacy Med Name: BUTALBITAL-APAP-CAFFEINE 50-325-40] 20 tablet     Sig: TAKE 1 TABLET BY MOUTH EVERY 6 HOURS AS NEEDED MIGRAINE      Not Delegated - Analgesics:  Non-Opioid Analgesic Combinations Failed - 06/07/2020 11:28 AM      Failed - This refill cannot be delegated      Passed - Valid encounter within last 12 months    Recent Outpatient Visits           6 months ago Annual physical exam   [facility] [name], [place]   10 months ago Acute anxiety   [facility] [place], [place], [place]   2 years ago Essential hypertension   [facility] [place], [place], [place]   2 years ago Bronchitis   [facility] [place], [place], [place]   3 years ago Gastroesophageal reflux disease without esophagitis   [facility], [place], [place]

## 2020-06-10 ENCOUNTER — Other Ambulatory Visit: Payer: Self-pay | Admitting: Physician Assistant

## 2020-06-10 NOTE — Telephone Encounter (Signed)
Requested medication (s) are due for refill today  Yes  Requested medication (s) are on the active medication list   Yes  Future visit scheduled No  Note to clinic-Medication dosage is not delegated for [facility] to refill.   Requested Prescriptions  Pending Prescriptions Disp Refills   Vitamin D, Ergocalciferol, (DRISDOL) 1.25 MG (50000 UNIT) CAPS capsule [Pharmacy Med Name: VITAMIN D (ERGOCALCIFEROL) 1.25 MG] 12 capsule 1    Sig: TAKE 1 CAPSULE BY MOUTH ONCE A WEEK      Endocrinology:  Vitamins - Vitamin D Supplementation Failed - 06/10/2020  3:36 PM      Failed - 50,000 IU strengths are not delegated      Failed - Ca in normal range and within 360 days    Calcium  Date Value Ref Range Status  03/01/2020 8.8 (L) 8.9 - 10.3 mg/dL Final   Calcium, Total  Date Value Ref Range Status  04/11/2012 8.7 8.5 - 10.1 mg/dL Final          Failed - Phosphate in normal range and within 360 days    No results found for: PHOS        Failed - Vitamin D in normal range and within 360 days    No results found for: [id number], [id number], [id number], 25OHVITD3, 25OHVITD2, 25OHVITD3, 25OHVITD2, 25OHVITD1, 25OHVITD2, 25OHVITD3, VD25OH        Passed - Valid encounter within last 12 months    Recent Outpatient Visits           6 months ago Annual physical exam   [facility] [name], [place]   10 months ago Acute anxiety   [facility] [place], Alessandra Be[place], [place]   2 years ago Essential hypertension   [facility] [place], Alessandra Be[place], [place]   2 years ago Bronchitis   [facility] [place], [place], [place]   3 years ago Gastroesophageal reflux disease without esophagitis   [facility], [place], [place]

## 2020-06-12 NOTE — Telephone Encounter (Signed)
No Vit D level on file

## 2020-06-16 ENCOUNTER — Encounter: Payer: Self-pay | Admitting: Physician Assistant

## 2020-06-16 DIAGNOSIS — M170 Bilateral primary osteoarthritis of knee: Secondary | ICD-10-CM

## 2020-06-16 DIAGNOSIS — G8929 Other chronic pain: Secondary | ICD-10-CM

## 2020-06-16 MED ORDER — OXYCODONE-ACETAMINOPHEN 7.5-325 MG PO TABS: 1.0000 | ORAL_TABLET | Freq: Four times a day (QID) | ORAL | 0 refills | Status: DC | PRN

## 2020-06-17 ENCOUNTER — Other Ambulatory Visit: Payer: Self-pay | Admitting: Physician Assistant

## 2020-06-17 DIAGNOSIS — F419 Anxiety disorder, unspecified: Secondary | ICD-10-CM

## 2020-06-17 NOTE — Telephone Encounter (Signed)
Requested medication (s) are due for refill today: yes  Requested medication (s) are on the active medication list: yes  Last refill:  01/21/20  Future visit scheduled: no  Notes to clinic:  med not delegated to NT to RF   Requested Prescriptions  Pending Prescriptions Disp Refills   ALPRAZolam (XANAX) 0.5 MG tablet [Pharmacy Med Name: ALPRAZOLAM 0.5 MG TAB] 60 tablet     Sig: TAKE 1 TABLET BY MOUTH TWICE DAILY AS NEEDED ANXIETY      Not Delegated - Psychiatry:  Anxiolytics/Hypnotics Failed - 06/17/2020  9:31 AM      Failed - This refill cannot be delegated      Failed - Urine Drug Screen completed in last 360 days      Failed - Valid encounter within last 6 months    Recent Outpatient Visits           6 months ago Annual physical exam   [name], [place]   11 months ago Acute anxiety   [facility] [place], [place]andra [place], [place]   2 years ago Essential hypertension   [facility] [place], [place]andra [place], [place]   2 years ago Bronchitis   [facility] [place], [place], [place]   3 years ago Gastroesophageal reflux disease without esophagitis   [facility], [place], [place]

## 2020-06-29 ENCOUNTER — Other Ambulatory Visit: Payer: Self-pay | Admitting: Physician Assistant

## 2020-06-29 DIAGNOSIS — E7800 Pure hypercholesterolemia, unspecified: Secondary | ICD-10-CM

## 2020-07-08 ENCOUNTER — Other Ambulatory Visit: Payer: Self-pay | Admitting: Physician Assistant

## 2020-07-10 MED ORDER — ONDANSETRON HCL 4 MG PO TABS: 4.0000 mg | ORAL_TABLET | Freq: Three times a day (TID) | ORAL | 0 refills | Status: DC | PRN

## 2020-07-12 ENCOUNTER — Other Ambulatory Visit: Payer: Self-pay | Admitting: Physician Assistant

## 2020-07-12 ENCOUNTER — Encounter: Payer: Self-pay | Admitting: Gastroenterology

## 2020-07-12 ENCOUNTER — Ambulatory Visit: Payer: No Typology Code available for payment source | Admitting: Gastroenterology

## 2020-07-12 DIAGNOSIS — M545 Low back pain, unspecified: Secondary | ICD-10-CM

## 2020-07-12 DIAGNOSIS — M170 Bilateral primary osteoarthritis of knee: Secondary | ICD-10-CM

## 2020-07-12 DIAGNOSIS — G8929 Other chronic pain: Secondary | ICD-10-CM

## 2020-07-13 NOTE — Telephone Encounter (Signed)
30 days supply dispensed 06-16-2020

## 2020-07-14 MED ORDER — OXYCODONE-ACETAMINOPHEN 7.5-325 MG PO TABS: 1.0000 | ORAL_TABLET | Freq: Four times a day (QID) | ORAL | 0 refills | Status: DC | PRN

## 2020-07-24 ENCOUNTER — Encounter: Payer: Self-pay | Admitting: Physician Assistant

## 2020-07-27 ENCOUNTER — Telehealth: Payer: Self-pay

## 2020-07-27 NOTE — Telephone Encounter (Signed)
Patient in recalls for mammogram and follow up with Dr Aleen Campi. Message left for her to call us back. Need to know if she has medical insurance and when she would like her mammogram scheduled.

## 2020-08-08 ENCOUNTER — Other Ambulatory Visit: Payer: Self-pay

## 2020-08-08 DIAGNOSIS — Z1231 Encounter for screening mammogram for malignant neoplasm of breast: Secondary | ICD-10-CM

## 2020-08-10 ENCOUNTER — Encounter: Payer: Self-pay | Admitting: Physician Assistant

## 2020-08-10 ENCOUNTER — Other Ambulatory Visit: Payer: Self-pay | Admitting: Family Medicine

## 2020-08-10 DIAGNOSIS — M170 Bilateral primary osteoarthritis of knee: Secondary | ICD-10-CM

## 2020-08-10 DIAGNOSIS — G8929 Other chronic pain: Secondary | ICD-10-CM

## 2020-08-10 DIAGNOSIS — M545 Low back pain, unspecified: Secondary | ICD-10-CM

## 2020-08-11 MED ORDER — OXYCODONE-ACETAMINOPHEN 7.5-325 MG PO TABS: 1.0000 | ORAL_TABLET | Freq: Four times a day (QID) | ORAL | 0 refills | Status: DC | PRN

## 2020-08-11 NOTE — Telephone Encounter (Signed)
Needs f/u appointment.

## 2020-08-15 ENCOUNTER — Other Ambulatory Visit: Payer: Self-pay

## 2020-08-15 ENCOUNTER — Ambulatory Visit
Admission: RE | Admit: 2020-08-15 | Discharge: 2020-08-15 | Disposition: A | Discharge status: Home / Self Care | Payer: No Typology Code available for payment source | Source: Ambulatory Visit | Attending: Surgery | Admitting: Surgery

## 2020-08-15 DIAGNOSIS — Z1231 Encounter for screening mammogram for malignant neoplasm of breast: Secondary | ICD-10-CM | POA: Diagnosis not present

## 2020-08-16 ENCOUNTER — Telehealth: Payer: Self-pay

## 2020-08-16 NOTE — Telephone Encounter (Signed)
Left message for patient-per Dr.Pabon mammogram is normal-reminded patient to keep 08/21/20 appointment with Dr.Pabon.

## 2020-08-21 ENCOUNTER — Ambulatory Visit: Payer: No Typology Code available for payment source | Admitting: Surgery

## 2020-09-08 ENCOUNTER — Other Ambulatory Visit: Payer: Self-pay | Admitting: Family Medicine

## 2020-09-08 ENCOUNTER — Encounter: Payer: Self-pay | Admitting: Physician Assistant

## 2020-09-08 DIAGNOSIS — M170 Bilateral primary osteoarthritis of knee: Secondary | ICD-10-CM

## 2020-09-08 DIAGNOSIS — G8929 Other chronic pain: Secondary | ICD-10-CM

## 2020-09-08 DIAGNOSIS — M545 Low back pain, unspecified: Secondary | ICD-10-CM

## 2020-09-08 NOTE — Telephone Encounter (Signed)
Last refill was on 08/11/20. 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

## 2020-09-11 ENCOUNTER — Other Ambulatory Visit: Payer: Self-pay | Admitting: Physician Assistant

## 2020-09-11 ENCOUNTER — Other Ambulatory Visit: Payer: Self-pay

## 2020-09-11 ENCOUNTER — Other Ambulatory Visit: Payer: Self-pay | Admitting: Family Medicine

## 2020-09-11 DIAGNOSIS — E7800 Pure hypercholesterolemia, unspecified: Secondary | ICD-10-CM

## 2020-09-11 DIAGNOSIS — M170 Bilateral primary osteoarthritis of knee: Secondary | ICD-10-CM

## 2020-09-11 DIAGNOSIS — G8929 Other chronic pain: Secondary | ICD-10-CM

## 2020-09-11 DIAGNOSIS — M545 Low back pain, unspecified: Secondary | ICD-10-CM

## 2020-09-11 MED ORDER — OXYCODONE-ACETAMINOPHEN 7.5-325 MG PO TABS: 1.0000 | ORAL_TABLET | Freq: Four times a day (QID) | ORAL | 0 refills | Status: DC | PRN

## 2020-09-11 MED ORDER — PRAVASTATIN SODIUM 40 MG PO TABS: ORAL_TABLET | ORAL | 0 refills | Status: DC

## 2020-09-11 NOTE — Telephone Encounter (Signed)
Medication: pravastatin (PRAVACHOL) 40 MG tablet [[id number]] , oxyCODONE-acetaminophen (PERCOCET) 7.5-325 MG tablet [[id number]] Apt scheduled 10/02/20  Has the patient contacted their pharmacy? YES  (Agent: If no, request that the patient contact the pharmacy for the refill.) (Agent: If yes, when and what did the pharmacy advise?)  Preferred Pharmacy (with phone number or street name): [facility] - [place], [place] - [address] [place] [address] Phone: [phone] Fax: [phone] Hours: Not open 24 hours    Agent: Please be advised that RX refills may take up to 3 business days. We ask that you follow-up with your pharmacy.

## 2020-09-11 NOTE — Telephone Encounter (Signed)
Patient had been told she could not get the pain medication until she was seen.  She has since mad an appointment for July 18. Could you refill her medication now that she made the appointment? Thanks

## 2020-09-11 NOTE — Telephone Encounter (Signed)
  Notes to clinic:  patient has appt on 10/02/2020 Review for short refill    Requested Prescriptions  Pending Prescriptions Disp Refills   pravastatin (PRAVACHOL) 40 MG tablet 90 tablet 0    Sig: TAKE 2 TABLETS BY MOUTH ONCE EVERY EVENING      There is no refill protocol information for this order      oxyCODONE-acetaminophen (PERCOCET) 7.5-325 MG tablet 120 tablet 0    Sig: Take 1 tablet by mouth every 6 (six) hours as needed for severe pain. No further refills of any controlled substances until after follow-up appointment      There is no refill protocol information for this order

## 2020-10-02 ENCOUNTER — Ambulatory Visit: Payer: Self-pay | Admitting: Family Medicine

## 2020-10-09 ENCOUNTER — Other Ambulatory Visit: Payer: Self-pay | Admitting: Family Medicine

## 2020-10-09 ENCOUNTER — Encounter: Payer: Self-pay | Admitting: Physician Assistant

## 2020-10-09 DIAGNOSIS — M170 Bilateral primary osteoarthritis of knee: Secondary | ICD-10-CM

## 2020-10-09 DIAGNOSIS — G8929 Other chronic pain: Secondary | ICD-10-CM

## 2020-10-09 DIAGNOSIS — M545 Low back pain, unspecified: Secondary | ICD-10-CM

## 2020-10-10 ENCOUNTER — Other Ambulatory Visit: Payer: Self-pay

## 2020-10-10 DIAGNOSIS — M545 Low back pain, unspecified: Secondary | ICD-10-CM

## 2020-10-10 DIAGNOSIS — M170 Bilateral primary osteoarthritis of knee: Secondary | ICD-10-CM

## 2020-10-10 DIAGNOSIS — G8929 Other chronic pain: Secondary | ICD-10-CM

## 2020-10-10 MED ORDER — OXYCODONE-ACETAMINOPHEN 7.5-325 MG PO TABS
1.0000 | ORAL_TABLET | Freq: Four times a day (QID) | ORAL | 0 refills | Status: DC | PRN
Start: 2020-10-10 — End: 2020-11-08

## 2020-10-10 NOTE — Telephone Encounter (Signed)
Patient has made appointment with you for Thursday after I told her we could no longer refill without an appointment being made

## 2020-10-12 ENCOUNTER — Ambulatory Visit: Payer: Self-pay | Admitting: Family Medicine

## 2020-10-13 ENCOUNTER — Other Ambulatory Visit: Payer: Self-pay | Admitting: Family Medicine

## 2020-10-16 MED ORDER — ONDANSETRON HCL 4 MG PO TABS: 4.0000 mg | ORAL_TABLET | Freq: Three times a day (TID) | ORAL | 0 refills | Status: DC | PRN

## 2020-10-19 NOTE — Progress Notes (Signed)
Established patient visit   Patient: Kristin Aguirre   DOB: 1960-12-29   60 y.o. Female  MRN: [id number] Visit Date: 10/20/2020  Today's healthcare provider: Jacky Kindle, FNP   CC: Headache/Migraine  Subjective    HPI  Patient is a 60 year old female who presents requesting refill on medications.  Patient reports that she has been having headaches for a couple of weeks.  She states it hurts at the base of her skull.  She has been taking Tylenol, Ibuprofen and BC for the pain.  She reports getting some relief, sometimes. Patient also complains of having some dyspnea on exertion.  She stats that since turning 59 she has no energy and if she goes up the stairs she gets out of breath.  Patient states she needs her Flonase and Citalopram refilled today     Medications: Outpatient Medications Prior to Visit  Medication Sig   ALPRAZolam (XANAX) 0.5 MG tablet TAKE 1 TABLET BY MOUTH TWICE DAILY AS NEEDED ANXIETY   Ascorbic Acid (VITAMIN C PO) Take 1 tablet by mouth daily at 12 noon.   aspirin EC 81 MG tablet Take 81 mg by mouth daily. Swallow whole.   Aspirin-Caffeine (BC FAST PAIN RELIEF ARTHRITIS) 1000-65 MG PACK Take 1 Package by mouth daily as needed (arthritis).   citalopram (CELEXA) 40 MG tablet Take 1 tablet (40 mg total) by mouth daily. (Patient taking differently: Take 40 mg by mouth at bedtime.)   fluticasone (FLONASE) 50 MCG/ACT nasal spray Place 2 sprays into both nostrils daily.   hydrochlorothiazide (HYDRODIURIL) 25 MG tablet Take 1 tablet (25 mg total) by mouth daily.   Multiple Vitamins-Minerals (ALIVE ONCE DAILY WOMENS PO) Take 2 tablets by mouth daily at 12 noon. gummies   ondansetron (ZOFRAN) 4 MG tablet Take 1 tablet (4 mg total) by mouth every 8 (eight) hours as needed for nausea or vomiting.   oxyCODONE-acetaminophen (PERCOCET) 7.5-325 MG tablet Take 1 tablet by mouth every 6 (six) hours as needed for severe pain. No further refills of any controlled  substances until after follow-up appointment   pantoprazole (PROTONIX) 40 MG tablet TAKE 1 TABLET BY MOUTH ONCE DAILY (Patient taking differently: Take 40 mg by mouth daily at 12 noon.)   pravastatin (PRAVACHOL) 40 MG tablet TAKE 2 TABLETS BY MOUTH ONCE EVERY EVENING   Vitamin D, Ergocalciferol, (DRISDOL) 1.25 MG (50000 UNIT) CAPS capsule TAKE 1 CAPSULE BY MOUTH ONCE A WEEK   No facility-administered medications prior to visit.    Review of Systems  Respiratory:  Negative for cough, chest tightness, shortness of breath and wheezing.   Cardiovascular:  Positive for leg swelling (sometimes). Negative for chest pain and palpitations.  Musculoskeletal:  Positive for arthralgias, back pain, gait problem, joint swelling, myalgias, neck pain and neck stiffness.      Objective    BP (!) 149/83 (BP Location: Left Arm, Patient Position: Sitting, Cuff Size: Normal)   Pulse 80   Temp 98.2 F (36.8 C) (Oral)   Wt 256 lb (116.1 kg)   LMP 03/18/2001 (Approximate)   SpO2 96%   BMI 37.80 kg/m     Physical Exam Constitutional:      General: She is not in acute distress.    Appearance: She is obese. She is not ill-appearing or toxic-appearing.  HENT:     Head: Normocephalic.     Mouth/Throat:     Mouth: Mucous membranes are moist.  Eyes:     Conjunctiva/sclera: Conjunctivae  normal.     Pupils: Pupils are equal, round, and reactive to light.  Neck:     Vascular: No carotid bruit.  Cardiovascular:     Rate and Rhythm: Normal rate and regular rhythm.     Pulses: Normal pulses.     Heart sounds: Normal heart sounds. No murmur heard.   No friction rub. No gallop.  Pulmonary:     Effort: Pulmonary effort is normal.     Breath sounds: Normal breath sounds.  Abdominal:     General: Bowel sounds are normal.     Palpations: Abdomen is soft. There is no mass.     Tenderness: There is no guarding or rebound.     Hernia: No hernia is present.  Musculoskeletal:        General: Normal range of  motion.     Cervical back: Normal range of motion and neck supple. No rigidity or tenderness.  Lymphadenopathy:     Cervical: No cervical adenopathy.  Skin:    General: Skin is warm.     Capillary Refill: Capillary refill takes 2 to 3 seconds.  Neurological:     General: No focal deficit present.     Mental Status: She is alert and oriented to person, place, and time. Mental status is at baseline.  Psychiatric:        Mood and Affect: Mood normal.        Behavior: Behavior normal.        Thought Content: Thought content normal.        Judgment: Judgment normal.      No results found for any visits on 10/20/20.  Assessment & Plan     Problem List Items Addressed This Visit       Cardiovascular and Mediastinum   Essential hypertension    Previous on HCTZ- no complications with use  Started ACEi today- 6 week f/u   Pt educated on compliance of medication and need for follow up       Relevant Medications   lisinopril (ZESTRIL) 10 MG tablet   Other Relevant Orders   TSH   CBC with Differential/Platelet   Comprehensive metabolic panel   Lipid panel   Magnesium   TSH   Intractable migraine without aura and without status migrainosus - Primary    Migraine, previously treated with fiorcet- no longer working  Advised to wean off BC powder- can cause rebound issues and fluid retention  Continue ibuprofen and tylenol- max dose of 4g per day  Limit caffeine-  Push water intake- 8, 8 oz glasses per day  Encouraged sleep hygiene- pt states no difficulty  Start triptain today       Relevant Medications   citalopram (CELEXA) 40 MG tablet   SUMAtriptan (IMITREX) 50 MG tablet   lisinopril (ZESTRIL) 10 MG tablet   Other Relevant Orders   TSH   TSH     Respiratory   Allergic rhinitis    Denies sinus involvement  Refill of flonase sent  Can use saline nasal spray if needed- available OTC       RESOLVED: Chronic pansinusitis   Relevant Medications    fluticasone (FLONASE) 50 MCG/ACT nasal spray     Digestive   Acid reflux    Stable at this time  Some nausea early morning- taking zofran PRN  Encouraged diet low in saturated fat and regular exercise - 30 min at least 5 times per week          Musculoskeletal  and Integument   Primary osteoarthritis of both knees    Chronic in nature  Voltaren gel added  Encouraged fluids and leg strengthening exercises  Weight loss also discussed       Relevant Medications   diclofenac Sodium (VOLTAREN) 1 % GEL     Other   Anxiety    Work related- per pt report  Restart celexa today  Wean off xanax       Relevant Medications   citalopram (CELEXA) 40 MG tablet   Other Relevant Orders   TSH   Hyperlipidemia    Lipid panel done today  Continue previous Rx for statin  Will advise pt of results and POC       Relevant Medications   lisinopril (ZESTRIL) 10 MG tablet   Other Relevant Orders   CBC with Differential/Platelet   Comprehensive metabolic panel   Lipid panel   Obesity (BMI 35.0-39.9 without comorbidity)    Things to do to keep yourself healthy  - Exercise at least 30-45 minutes a day, 3-4 days a week.  - Eat a low-fat diet with lots of fruits and vegetables, up to 7-9 servings per day.    Weight loss can assist with reducing pain in knees.       Colon cancer screening    Referral sent  Discussed cologuard and pt educated on importance of ability to perform interventional and diagnostic procedures in one day with traditionally screening       Relevant Orders   Ambulatory referral to Gastroenterology     No follow-ups on file.     Leilani Merl, FNP, have reviewed all documentation for this visit. The documentation on 10/20/20 for the exam, diagnosis, procedures, and orders are all accurate and complete.    Jacky Kindle, FNP  [facility] [phone] (phone) [phone] (fax)  [facility]

## 2020-10-20 ENCOUNTER — Ambulatory Visit: Payer: No Typology Code available for payment source | Admitting: Family Medicine

## 2020-10-20 ENCOUNTER — Encounter: Payer: Self-pay | Admitting: Family Medicine

## 2020-10-20 ENCOUNTER — Other Ambulatory Visit: Payer: Self-pay

## 2020-10-20 VITALS — BP 149/83 | HR 80 | Temp 98.2°F | Wt 256.0 lb

## 2020-10-20 DIAGNOSIS — G43019 Migraine without aura, intractable, without status migrainosus: Secondary | ICD-10-CM | POA: Insufficient documentation

## 2020-10-20 DIAGNOSIS — I10 Essential (primary) hypertension: Secondary | ICD-10-CM | POA: Diagnosis not present

## 2020-10-20 DIAGNOSIS — Z1211 Encounter for screening for malignant neoplasm of colon: Secondary | ICD-10-CM | POA: Insufficient documentation

## 2020-10-20 DIAGNOSIS — F419 Anxiety disorder, unspecified: Secondary | ICD-10-CM | POA: Diagnosis not present

## 2020-10-20 DIAGNOSIS — M170 Bilateral primary osteoarthritis of knee: Secondary | ICD-10-CM

## 2020-10-20 DIAGNOSIS — J324 Chronic pansinusitis: Secondary | ICD-10-CM | POA: Insufficient documentation

## 2020-10-20 DIAGNOSIS — J3089 Other allergic rhinitis: Secondary | ICD-10-CM

## 2020-10-20 DIAGNOSIS — E669 Obesity, unspecified: Secondary | ICD-10-CM

## 2020-10-20 DIAGNOSIS — K219 Gastro-esophageal reflux disease without esophagitis: Secondary | ICD-10-CM

## 2020-10-20 DIAGNOSIS — E7800 Pure hypercholesterolemia, unspecified: Secondary | ICD-10-CM

## 2020-10-20 HISTORY — DX: Encounter for screening for malignant neoplasm of colon: Z12.11

## 2020-10-20 MED ORDER — CITALOPRAM HYDROBROMIDE 40 MG PO TABS
40.0000 mg | ORAL_TABLET | Freq: Every day | ORAL | 3 refills | Status: DC
Start: 2020-10-20 — End: 2021-12-28

## 2020-10-20 MED ORDER — SUMATRIPTAN SUCCINATE 50 MG PO TABS: 50.0000 mg | ORAL_TABLET | ORAL | 3 refills | Status: DC | PRN

## 2020-10-20 MED ORDER — LISINOPRIL 10 MG PO TABS: 10.0000 mg | ORAL_TABLET | Freq: Every day | ORAL | 3 refills | Status: DC

## 2020-10-20 MED ORDER — FLUTICASONE PROPIONATE 50 MCG/ACT NA SUSP: 2.0000 | Freq: Every day | NASAL | 1 refills | Status: DC

## 2020-10-20 MED ORDER — DICLOFENAC SODIUM 1 % EX GEL: 4.0000 g | Freq: Four times a day (QID) | CUTANEOUS | 3 refills | Status: DC | PRN

## 2020-10-20 NOTE — Assessment & Plan Note (Signed)
Work related- per pt report  Restart celexa today  Wean off xanax

## 2020-10-20 NOTE — Assessment & Plan Note (Signed)
Things to do to keep yourself healthy  - Exercise at least 30-45 minutes a day, 3-4 days a week.  - Eat a low-fat diet with lots of fruits and vegetables, up to 7-9 servings per day.    Weight loss can assist with reducing pain in knees.

## 2020-10-20 NOTE — Assessment & Plan Note (Signed)
Chronic in nature  Voltaren gel added  Encouraged fluids and leg strengthening exercises  Weight loss also discussed

## 2020-10-20 NOTE — Assessment & Plan Note (Signed)
Previous on HCTZ- no complications with use  Started ACEi today- 6 week f/u   Pt educated on compliance of medication and need for follow up

## 2020-10-20 NOTE — Assessment & Plan Note (Signed)
Stable at this time  Some nausea early morning- taking zofran PRN  Encouraged diet low in saturated fat and regular exercise - 30 min at least 5 times per week

## 2020-10-20 NOTE — Assessment & Plan Note (Signed)
Denies sinus involvement  Refill of flonase sent  Can use saline nasal spray if needed- available OTC

## 2020-10-20 NOTE — Assessment & Plan Note (Signed)
Migraine, previously treated with fiorcet- no longer working  Advised to wean off BC powder- can cause rebound issues and fluid retention  Continue ibuprofen and tylenol- max dose of 4g per day  Limit caffeine-  Push water intake- 8, 8 oz glasses per day  Encouraged sleep hygiene- pt states no difficulty  Start triptain today

## 2020-10-20 NOTE — Assessment & Plan Note (Signed)
Referral sent  Discussed cologuard and pt educated on importance of ability to perform interventional and diagnostic procedures in one day with traditionally screening

## 2020-10-20 NOTE — Assessment & Plan Note (Addendum)
Lipid panel done today  Continue previous Rx for statin  Will advise pt of results and POC

## 2020-10-21 LAB — LIPID PANEL
Chol/HDL Ratio: 4.8 ratio — ABNORMAL HIGH (ref 0.0–4.4)
Cholesterol, Total: 235 mg/dL — ABNORMAL HIGH (ref 100–199)
HDL: 49 mg/dL (ref >39)
LDL Chol Calc (NIH): 143 mg/dL — ABNORMAL HIGH (ref 0–99)
Triglycerides: 239 mg/dL — ABNORMAL HIGH (ref 0–149)
VLDL Cholesterol Cal: 43 mg/dL — ABNORMAL HIGH (ref 5–40)

## 2020-10-21 LAB — CBC WITH DIFFERENTIAL/PLATELET
Basophils Absolute: 0.1 10*3/uL (ref 0.0–0.2)
Basos: 1 %
EOS (ABSOLUTE): 0.5 10*3/uL — ABNORMAL HIGH (ref 0.0–0.4)
Eos: 6 %
Hematocrit: 38.3 % (ref 34.0–46.6)
Hemoglobin: 12.5 g/dL (ref 11.1–15.9)
Immature Grans (Abs): 0.1 10*3/uL (ref 0.0–0.1)
Immature Granulocytes: 1 %
Lymphocytes Absolute: 2 10*3/uL (ref 0.7–3.1)
Lymphs: 23 %
MCH: 29.3 pg (ref 26.6–33.0)
MCHC: 32.6 g/dL (ref 31.5–35.7)
MCV: 90 fL (ref 79–97)
Monocytes Absolute: 0.5 10*3/uL (ref 0.1–0.9)
Monocytes: 5 %
Neutrophils Absolute: 5.5 10*3/uL (ref 1.4–7.0)
Neutrophils: 64 %
Platelets: 306 10*3/uL (ref 150–450)
RBC: 4.26 x10E6/uL (ref 3.77–5.28)
RDW: 12.1 % (ref 11.7–15.4)
WBC: 8.7 10*3/uL (ref 3.4–10.8)

## 2020-10-21 LAB — COMPREHENSIVE METABOLIC PANEL
ALT: 19 IU/L (ref 0–32)
AST: 20 IU/L (ref 0–40)
Albumin/Globulin Ratio: 1.5 (ref 1.2–2.2)
Albumin: 4.2 g/dL (ref 3.8–4.8)
Alkaline Phosphatase: 106 IU/L (ref 44–121)
BUN/Creatinine Ratio: 20 (ref 12–28)
BUN: 13 mg/dL (ref 8–27)
Bilirubin Total: <0.2 mg/dL (ref 0.0–1.2)
CO2: 24 mmol/L (ref 20–29)
Calcium: 9.4 mg/dL (ref 8.7–10.3)
Chloride: 101 mmol/L (ref 96–106)
Creatinine, Ser: 0.66 mg/dL (ref 0.57–1.00)
Globulin, Total: 2.8 g/dL (ref 1.5–4.5)
Glucose: 108 mg/dL — ABNORMAL HIGH (ref 65–99)
Potassium: 4.9 mmol/L (ref 3.5–5.2)
Sodium: 140 mmol/L (ref 134–144)
Total Protein: 7 g/dL (ref 6.0–8.5)
eGFR: 100 mL/min/{1.73_m2} (ref >59)

## 2020-10-21 LAB — TSH: TSH: 0.693 u[IU]/mL (ref 0.450–4.500)

## 2020-10-21 LAB — MAGNESIUM: Magnesium: 2.1 mg/dL (ref 1.6–2.3)

## 2020-10-23 ENCOUNTER — Encounter: Payer: Self-pay | Admitting: Family Medicine

## 2020-10-23 DIAGNOSIS — E7800 Pure hypercholesterolemia, unspecified: Secondary | ICD-10-CM

## 2020-10-23 MED ORDER — ROSUVASTATIN CALCIUM 10 MG PO TABS: 10.0000 mg | ORAL_TABLET | Freq: Every day | ORAL | 3 refills | Status: DC

## 2020-10-23 NOTE — Progress Notes (Signed)
I have heard from our referral team and they are working to getting you into GI.  Your Thyroid lab is stable.  Your cell count is stable and normal- there was one elevation marked, EOS Absolute- was minimally elevated, which could be from an allergic response to something within the community.  I do not think there is a need to follow up on that with additional labwork given it was a minor elevation.  Your glucose was elevated on your exam- 108, we aim for fasting to be <100.  Continue to eat small meals, with snacks.  Choosing water to drink.  Your lipid panel is elevated; beyond what it was previously.  Are you taking the 80 mg of pravastatin as prescribed?  If so, we will need to find a stronger medication for you.  We continue to recommend diet low in saturated fat and regular exercise - 30 min at least 5 times per week.  Your current risk of stroke or heart attack in the next 10-years, ASCVD risk score Denman George DC Montez Hageman., et al., 2013) is: 15.8%   Values used to calculate the score:     Age: 60 years     Sex: Female     Is Non-Hispanic African American: No     Diabetic: No     Tobacco smoker: Yes     Systolic Blood Pressure: 149 mmHg     Is BP treated: Yes     HDL Cholesterol: 49 mg/dL     Total Cholesterol: 235 mg/dL  Your magnesium level is stable at this time.

## 2020-10-24 ENCOUNTER — Encounter: Payer: Self-pay | Admitting: Family Medicine

## 2020-10-24 DIAGNOSIS — G43019 Migraine without aura, intractable, without status migrainosus: Secondary | ICD-10-CM

## 2020-10-31 ENCOUNTER — Other Ambulatory Visit: Payer: Self-pay | Admitting: Family Medicine

## 2020-10-31 DIAGNOSIS — G43019 Migraine without aura, intractable, without status migrainosus: Secondary | ICD-10-CM

## 2020-11-06 ENCOUNTER — Encounter: Payer: Self-pay | Admitting: Neurology

## 2020-11-07 ENCOUNTER — Telehealth: Payer: Self-pay

## 2020-11-07 NOTE — Telephone Encounter (Signed)
Patient to call me back later she was at work to get her scheduled

## 2020-11-08 ENCOUNTER — Other Ambulatory Visit: Payer: Self-pay | Admitting: Family Medicine

## 2020-11-08 ENCOUNTER — Other Ambulatory Visit (INDEPENDENT_AMBULATORY_CARE_PROVIDER_SITE_OTHER): Payer: Self-pay

## 2020-11-08 DIAGNOSIS — Z1211 Encounter for screening for malignant neoplasm of colon: Secondary | ICD-10-CM

## 2020-11-08 DIAGNOSIS — G8929 Other chronic pain: Secondary | ICD-10-CM

## 2020-11-08 DIAGNOSIS — M170 Bilateral primary osteoarthritis of knee: Secondary | ICD-10-CM

## 2020-11-08 DIAGNOSIS — M545 Low back pain, unspecified: Secondary | ICD-10-CM

## 2020-11-08 MED ORDER — OXYCODONE-ACETAMINOPHEN 5-325 MG PO TABS: 1.0000 | ORAL_TABLET | Freq: Four times a day (QID) | ORAL | 0 refills | Status: DC | PRN

## 2020-11-08 MED ORDER — CLENPIQ 10-3.5-12 MG-GM -GM/160ML PO SOLN: 1.0000 | ORAL | 0 refills | Status: DC

## 2020-11-08 NOTE — Progress Notes (Signed)
Gastroenterology Pre-Procedure Review  Request Date: 01/11/2021 Requesting Physician: [facility]  PATIENT REVIEW QUESTIONS: The patient responded to the following health history questions as indicated:    1. Are you having any GI issues? no 2. Do you have a personal history of Polyps? no 3. Do you have a family history of Colon Cancer or Polyps? no 4. Diabetes Mellitus? no 5. Joint replacements in the past 12 months?no 6. Major health problems in the past 3 months?no 7. Any artificial heart valves, MVP, or defibrillator?no    MEDICATIONS & ALLERGIES:    Patient reports the following regarding taking any anticoagulation/antiplatelet therapy:   Plavix, Coumadin, Eliquis, Xarelto, Lovenox, Pradaxa, Brilinta, or Effient? no Aspirin? yes (81 MG)  Patient confirms/reports the following medications:  Current Outpatient Medications  Medication Sig Dispense Refill   ALPRAZolam (XANAX) 0.5 MG tablet TAKE 1 TABLET BY MOUTH TWICE DAILY AS NEEDED ANXIETY 60 tablet 5   Ascorbic Acid (VITAMIN C PO) Take 1 tablet by mouth daily at 12 noon.     aspirin EC 81 MG tablet Take 81 mg by mouth daily. Swallow whole.     citalopram (CELEXA) 40 MG tablet Take 1 tablet (40 mg total) by mouth at bedtime. 90 tablet 3   diclofenac Sodium (VOLTAREN) 1 % GEL Apply 4 g topically 4 (four) times daily as needed. 100 g 3   fluticasone (FLONASE) 50 MCG/ACT nasal spray Place 2 sprays into both nostrils daily. 48 g 1   hydrochlorothiazide (HYDRODIURIL) 25 MG tablet Take 1 tablet (25 mg total) by mouth daily. 90 tablet 0   lisinopril (ZESTRIL) 10 MG tablet Take 1 tablet (10 mg total) by mouth daily. 90 tablet 3   Multiple Vitamins-Minerals (ALIVE ONCE DAILY WOMENS PO) Take 2 tablets by mouth daily at 12 noon. gummies     ondansetron (ZOFRAN) 4 MG tablet Take 1 tablet (4 mg total) by mouth every 8 (eight) hours as needed for nausea or vomiting. 20 tablet 0   oxyCODONE-acetaminophen (PERCOCET) 7.5-325 MG tablet Take 1 tablet by  mouth every 6 (six) hours as needed for severe pain. No further refills of any controlled substances until after follow-up appointment 120 tablet 0   pantoprazole (PROTONIX) 40 MG tablet TAKE 1 TABLET BY MOUTH ONCE DAILY (Patient taking differently: Take 40 mg by mouth daily at 12 noon.) 90 tablet 1   rosuvastatin (CRESTOR) 10 MG tablet Take 1 tablet (10 mg total) by mouth daily. 90 tablet 3   SUMAtriptan (IMITREX) 50 MG tablet Take 1 tablet (50 mg total) by mouth every 2 (two) hours as needed for migraine (2 doses max per day). May repeat in 2 hours if headache persists or recurs. 20 tablet 3   Vitamin D, Ergocalciferol, (DRISDOL) 1.25 MG (50000 UNIT) CAPS capsule TAKE 1 CAPSULE BY MOUTH ONCE A WEEK 12 capsule 1   No current facility-administered medications for this visit.    Patient confirms/reports the following allergies:  Allergies  Allergen Reactions   Furosemide Rash    No orders of the defined types were placed in this encounter.   AUTHORIZATION INFORMATION Primary Insurance: 1D#: Group #:  Secondary Insurance: 1D#: Group #:  SCHEDULE INFORMATION: Date: 01/11/2021 Time: Location:[facility]

## 2020-12-01 ENCOUNTER — Encounter: Payer: Self-pay | Admitting: Family Medicine

## 2020-12-01 ENCOUNTER — Ambulatory Visit (INDEPENDENT_AMBULATORY_CARE_PROVIDER_SITE_OTHER): Payer: No Typology Code available for payment source | Admitting: Family Medicine

## 2020-12-01 ENCOUNTER — Other Ambulatory Visit: Payer: Self-pay

## 2020-12-01 ENCOUNTER — Telehealth: Payer: Self-pay | Admitting: Gastroenterology

## 2020-12-01 VITALS — BP 154/85 | HR 70 | Temp 98.3°F | Ht 69.0 in | Wt 254.3 lb

## 2020-12-01 DIAGNOSIS — M170 Bilateral primary osteoarthritis of knee: Secondary | ICD-10-CM

## 2020-12-01 DIAGNOSIS — Z1211 Encounter for screening for malignant neoplasm of colon: Secondary | ICD-10-CM | POA: Diagnosis not present

## 2020-12-01 DIAGNOSIS — G43019 Migraine without aura, intractable, without status migrainosus: Secondary | ICD-10-CM | POA: Diagnosis not present

## 2020-12-01 DIAGNOSIS — I10 Essential (primary) hypertension: Secondary | ICD-10-CM | POA: Diagnosis not present

## 2020-12-01 MED ORDER — SUMATRIPTAN SUCCINATE 50 MG PO TABS: 50.0000 mg | ORAL_TABLET | ORAL | 3 refills | Status: DC | PRN

## 2020-12-01 MED ORDER — PROPRANOLOL HCL 20 MG PO TABS: 20.0000 mg | ORAL_TABLET | Freq: Two times a day (BID) | ORAL | 1 refills | Status: DC

## 2020-12-01 MED ORDER — OXYCODONE-ACETAMINOPHEN 7.5-325 MG PO TABS
1.0000 | ORAL_TABLET | ORAL | 0 refills | Status: DC | PRN
Start: 2020-12-01 — End: 2020-12-29

## 2020-12-01 NOTE — Progress Notes (Signed)
Established patient visit   Patient: Kristin Aguirre   DOB: 24-Oct-1960   60 y.o. Female  MRN: [id number] Visit Date: 12/01/2020  Today's healthcare provider: Gwyneth Sprout, FNP   Chief Complaint  Patient presents with   Follow-up   Hypertension   Subjective    HPI  Hypertension, follow-up  BP Readings from Last 3 Encounters:  12/01/20 (!) 154/85  10/20/20 (!) 149/83  03/03/20 (!) 165/77   Wt Readings from Last 3 Encounters:  12/01/20 254 lb 4.8 oz (115.3 kg)  10/20/20 256 lb (116.1 kg)  03/03/20 257 lb 15 oz (117 kg)     She was last seen for hypertension 6 weeks ago. (10/20/20) BP at that visit was 149/83. Management since that visit includes Start HCTZ.  She reports good compliance with treatment. She is not having side effects.  She is following a Regular diet. She is exercising. She does smoke.  Use of agents associated with hypertension: none.   Outside blood pressures are nonw (will check at [facility] or [facility] if she is near a store). Symptoms: No chest pain No chest pressure  No palpitations No syncope  No dyspnea No orthopnea  No paroxysmal nocturnal dyspnea No lower extremity edema   Pertinent labs: Lab Results  Component Value Date   CHOL 235 (H) 10/20/2020   HDL 49 10/20/2020   LDLCALC 143 (H) 10/20/2020   TRIG 239 (H) 10/20/2020   CHOLHDL 4.8 (H) 10/20/2020   Lab Results  Component Value Date   NA 140 10/20/2020   K 4.9 10/20/2020   CREATININE 0.66 10/20/2020   GFRNONAA >60 03/01/2020   GFRAA >60 02/04/2017   GLUCOSE 108 (H) 10/20/2020     The 10-year ASCVD risk score (Arnett DK, et al., 2019) is: 16.8%   ---------------------------------------------------------------------------------------------------   Medications: Outpatient Medications Prior to Visit  Medication Sig   ALPRAZolam (XANAX) 0.5 MG tablet TAKE 1 TABLET BY MOUTH TWICE DAILY AS NEEDED ANXIETY   Ascorbic Acid (VITAMIN C PO) Take 1 tablet by mouth daily at 12  noon.   aspirin EC 81 MG tablet Take 81 mg by mouth daily. Swallow whole.   citalopram (CELEXA) 40 MG tablet Take 1 tablet (40 mg total) by mouth at bedtime.   diclofenac Sodium (VOLTAREN) 1 % GEL Apply 4 g topically 4 (four) times daily as needed.   fluticasone (FLONASE) 50 MCG/ACT nasal spray Place 2 sprays into both nostrils daily.   hydrochlorothiazide (HYDRODIURIL) 25 MG tablet Take 1 tablet (25 mg total) by mouth daily.   lisinopril (ZESTRIL) 10 MG tablet Take 1 tablet (10 mg total) by mouth daily.   Multiple Vitamins-Minerals (ALIVE ONCE DAILY WOMENS PO) Take 2 tablets by mouth daily at 12 noon. gummies   ondansetron (ZOFRAN) 4 MG tablet Take 1 tablet (4 mg total) by mouth every 8 (eight) hours as needed for nausea or vomiting.   pantoprazole (PROTONIX) 40 MG tablet TAKE 1 TABLET BY MOUTH ONCE DAILY (Patient taking differently: Take 40 mg by mouth daily at 12 noon.)   rosuvastatin (CRESTOR) 10 MG tablet Take 1 tablet (10 mg total) by mouth daily.   Sod Picosulfate-Mag Ox-Cit Acd (CLENPIQ) 10-3.5-12 MG-GM -GM/160ML SOLN Take 1 kit by mouth as directed. At 5 PM evening before procedure, drink 1 bottle of Clenpiq, hydrate, drink (5) 8 oz of water. Then do the same thing 5 hours prior to your procedure.   Vitamin D, Ergocalciferol, (DRISDOL) 1.25 MG (50000 UNIT) CAPS capsule TAKE  1 CAPSULE BY MOUTH ONCE A WEEK   [DISCONTINUED] oxyCODONE-acetaminophen (PERCOCET/ROXICET) 5-325 MG tablet Take 1 tablet by mouth every 6 (six) hours as needed for severe pain.   [DISCONTINUED] SUMAtriptan (IMITREX) 50 MG tablet Take 1 tablet (50 mg total) by mouth every 2 (two) hours as needed for migraine (2 doses max per day). May repeat in 2 hours if headache persists or recurs.   No facility-administered medications prior to visit.    Review of Systems     Objective    BP (!) 154/85 (BP Location: Right Arm, Patient Position: Sitting, Cuff Size: Normal)   Pulse 70   Temp 98.3 F (36.8 C) (Oral)   Ht _0   (1.753 m)   Wt 254 lb 4.8 oz (115.3 kg)   LMP 03/18/2001 (Approximate)   SpO2 95%   BMI 37.55 kg/m  {Show previous vital signs (optional):23777}  Physical Exam Vitals and nursing note reviewed.  Constitutional:      General: She is awake. She is not in acute distress.    Appearance: Normal appearance. She is well-developed and well-groomed. She is obese. She is not ill-appearing, toxic-appearing or diaphoretic.  HENT:     Head: Normocephalic and atraumatic.     Jaw: There is normal jaw occlusion. No trismus, tenderness, swelling or pain on movement.     Right Ear: Hearing, tympanic membrane, ear canal and external ear normal. There is no impacted cerumen.     Left Ear: Hearing, tympanic membrane, ear canal and external ear normal. There is no impacted cerumen.     Nose: Nose normal. No congestion or rhinorrhea.     Right Turbinates: Not enlarged, swollen or pale.     Left Turbinates: Not enlarged, swollen or pale.     Right Sinus: No maxillary sinus tenderness or frontal sinus tenderness.     Left Sinus: No maxillary sinus tenderness or frontal sinus tenderness.     Mouth/Throat:     Lips: Pink.     Mouth: Mucous membranes are moist. No injury.     Tongue: No lesions.     Pharynx: Oropharynx is clear. Uvula midline. No pharyngeal swelling, oropharyngeal exudate, posterior oropharyngeal erythema or uvula swelling.     Tonsils: No tonsillar exudate or tonsillar abscesses.  Eyes:     General: Lids are normal. Lids are everted, no foreign bodies appreciated. Vision grossly intact. Gaze aligned appropriately. No allergic shiner or visual field deficit.       Right eye: No discharge.        Left eye: No discharge.     Extraocular Movements: Extraocular movements intact.     Conjunctiva/sclera: Conjunctivae normal.     Right eye: Right conjunctiva is not injected. No exudate.    Left eye: Left conjunctiva is not injected. No exudate.    Pupils: Pupils are equal, round, and reactive to  light.  Neck:     Thyroid: No thyroid mass, thyromegaly or thyroid tenderness.     Vascular: No carotid bruit.     Trachea: Trachea normal.  Cardiovascular:     Rate and Rhythm: Normal rate and regular rhythm.     Pulses: Normal pulses.          Carotid pulses are 2+ on the right side and 2+ on the left side.      Radial pulses are 2+ on the right side and 2+ on the left side.       Dorsalis pedis pulses are 2+ on the right side  and 2+ on the left side.       Posterior tibial pulses are 2+ on the right side and 2+ on the left side.     Heart sounds: Normal heart sounds, S1 normal and S2 normal. No murmur heard.   No friction rub. No gallop.  Pulmonary:     Effort: Pulmonary effort is normal. No respiratory distress.     Breath sounds: Normal breath sounds and air entry. No stridor. No wheezing, rhonchi or rales.  Chest:     Chest wall: No tenderness.     Comments: Breast exam deferred; discussed 'know your lemons' campaign and self exam Abdominal:     General: Abdomen is flat. Bowel sounds are normal. There is no distension.     Palpations: Abdomen is soft. There is no mass.     Tenderness: There is no abdominal tenderness. There is no right CVA tenderness, left CVA tenderness, guarding or rebound.     Hernia: No hernia is present.  Genitourinary:    Comments: Exam deferred; denies complaints Musculoskeletal:        General: No swelling, tenderness, deformity or signs of injury. Normal range of motion.     Cervical back: Full passive range of motion without pain, normal range of motion and neck supple. No edema, rigidity or tenderness. No muscular tenderness.     Right lower leg: No edema.     Left lower leg: No edema.  Lymphadenopathy:     Cervical: No cervical adenopathy.     Right cervical: No superficial, deep or posterior cervical adenopathy.    Left cervical: No superficial, deep or posterior cervical adenopathy.  Skin:    General: Skin is warm and dry.     Capillary  Refill: Capillary refill takes less than 2 seconds.     Coloration: Skin is not jaundiced or pale.     Findings: No bruising, erythema, lesion or rash.  Neurological:     General: No focal deficit present.     Mental Status: She is alert and oriented to person, place, and time. Mental status is at baseline.     GCS: GCS eye subscore is 4. GCS verbal subscore is 5. GCS motor subscore is 6.     Sensory: Sensation is intact. No sensory deficit.     Motor: Motor function is intact. No weakness.     Coordination: Coordination is intact. Coordination normal.     Gait: Gait is intact. Gait normal.  Psychiatric:        Attention and Perception: Attention and perception normal.        Mood and Affect: Mood and affect normal.        Speech: Speech normal.        Behavior: Behavior normal. Behavior is cooperative.        Thought Content: Thought content normal.        Cognition and Memory: Cognition and memory normal.        Judgment: Judgment normal.     No results found for any visits on 12/01/20.  Assessment & Plan     Problem List Items Addressed This Visit       Cardiovascular and Mediastinum   Essential hypertension    Wishes to hold increase of ACEi today given start of BB for migraine prevention Plans to purchase BP monitor 1 month f/u      Relevant Medications   propranolol (INDERAL) 20 MG tablet   Intractable migraine without aura and without status migrainosus  Has appt to neuro in 01/2022 Refill of PRN today Trial of BB as well      Relevant Medications   SUMAtriptan (IMITREX) 50 MG tablet   propranolol (INDERAL) 20 MG tablet   oxyCODONE-acetaminophen (PERCOCET) 7.5-325 MG tablet     Musculoskeletal and Integument   Primary osteoarthritis of both knees    Pt unable to tolerate wean from 7.5-25m oxy within percocet; refill at 7.5 Pain referral placed Patient very vocal about going to pain clinic Advised patient they handle chronic pain best and it would be in  her best interest to have them give their opinion Phone call to Sam PharmD at pharmacy to ensure proper fill of medication      Relevant Medications   oxyCODONE-acetaminophen (PERCOCET) 7.5-325 MG tablet   Other Relevant Orders   Ambulatory referral to Pain Clinic     Other   Colon cancer screening - Primary    Needs referral at ambulatory care setting; referral restarted to assist in location change to ensure insurance coverage      Relevant Orders   Ambulatory referral to Gastroenterology     Return in about 4 weeks (around 12/29/2020) for blood pressure check, HTN management.     IVonna Kotyk FNP, have reviewed all documentation for this visit. The documentation on 12/01/20 for the exam, diagnosis, procedures, and orders are all accurate and complete.   EGwyneth Sprout [phone](phone) [phone](fax)  CDel Muerto

## 2020-12-01 NOTE — Assessment & Plan Note (Signed)
Pt unable to tolerate wean from 7.5-5mg  oxy within percocet; refill at 7.5 Pain referral placed Patient very vocal about going to pain clinic Advised patient they handle chronic pain best and it would be in her best interest to have them give their opinion Phone call to Sam PharmD at pharmacy to ensure proper fill of medication

## 2020-12-01 NOTE — Assessment & Plan Note (Signed)
Wishes to hold increase of ACEi today given start of BB for migraine prevention Plans to purchase BP monitor 1 month f/u

## 2020-12-01 NOTE — Assessment & Plan Note (Signed)
Has appt to neuro in 01/2022 Refill of PRN today Trial of BB as well

## 2020-12-01 NOTE — Telephone Encounter (Signed)
Notified Endo unit that patient needs to cancel procedure. Left message with Byrd Hesselbach in Endo.

## 2020-12-01 NOTE — Telephone Encounter (Signed)
Reached back out with patient to inform her of the two free standing ambulatory centers [facility] or [facility]), so her insurance can cover her Colonoscpy screening. Patient will contact PCP to be referred to [facility] or [facility]. Procedure will be cancelled for 01/11/2021 with Dr. Tobi Bastos.

## 2020-12-01 NOTE — Assessment & Plan Note (Signed)
Needs referral at ambulatory care setting; referral restarted to assist in location change to ensure insurance coverage

## 2020-12-03 ENCOUNTER — Other Ambulatory Visit: Payer: Self-pay | Admitting: Family Medicine

## 2020-12-04 MED ORDER — ONDANSETRON HCL 4 MG PO TABS: 4.0000 mg | ORAL_TABLET | Freq: Three times a day (TID) | ORAL | 0 refills | Status: DC | PRN

## 2020-12-20 ENCOUNTER — Other Ambulatory Visit: Payer: Self-pay | Admitting: Physician Assistant

## 2020-12-20 DIAGNOSIS — F419 Anxiety disorder, unspecified: Secondary | ICD-10-CM

## 2020-12-28 ENCOUNTER — Other Ambulatory Visit: Payer: Self-pay | Admitting: Family Medicine

## 2020-12-28 DIAGNOSIS — M170 Bilateral primary osteoarthritis of knee: Secondary | ICD-10-CM

## 2020-12-29 ENCOUNTER — Ambulatory Visit: Payer: Self-pay

## 2020-12-29 ENCOUNTER — Other Ambulatory Visit: Payer: Self-pay | Admitting: Family Medicine

## 2020-12-29 DIAGNOSIS — M170 Bilateral primary osteoarthritis of knee: Secondary | ICD-10-CM

## 2020-12-29 DIAGNOSIS — U071 COVID-19: Secondary | ICD-10-CM

## 2020-12-29 HISTORY — DX: COVID-19: U07.1

## 2020-12-29 MED ORDER — OXYCODONE-ACETAMINOPHEN 7.5-325 MG PO TABS: 1.0000 | ORAL_TABLET | ORAL | 0 refills | Status: DC | PRN

## 2020-12-29 MED ORDER — NIRMATRELVIR/RITONAVIR (PAXLOVID)TABLET: 3.0000 | ORAL_TABLET | Freq: Two times a day (BID) | ORAL | 0 refills | Status: AC

## 2020-12-29 NOTE — Telephone Encounter (Signed)
Pt c/o severe headache that began Wednesday. Pt started it comes and goes. No visual disturbance no fever. Pt had been crying and was crying during triage. Pt has taken Tylenol Excedrin migraine, BC power. Pt requesting of Percocet to [facility]. Called office and spoke with [facility] to watch for triage note.       Reason for Disposition  [1] SEVERE headache (e.g., excruciating) AND [2] not improved after 2 hours of pain medicine  Answer Assessment - Initial Assessment Questions 1. LOCATION: "Where does it hurt?"      Top of head 2. ONSET: "When did the headache start?" (Minutes, hours or days)      Wednesday 3. PATTERN: "Does the pain come and go, or has it been constant since it started?"     comes 4. SEVERITY: "How bad is the pain?" and "What does it keep you from doing?"  (e.g., Scale 1-10; mild, moderate, or severe)   - MILD (1-3): doesn't interfere with normal activities    - MODERATE (4-7): interferes with normal activities or awakens from sleep    - SEVERE (8-10): excruciating pain, unable to do any normal activities        severe 5. RECURRENT SYMPTOM: "Have you ever had headaches before?" If Yes, ask: "When was the last time?" and "What happened that time?"      *No Answer* 6. CAUSE: "What do you think is causing the headache?"     covid 7. MIGRAINE: "Have you been diagnosed with migraine headaches?" If Yes, ask: "Is this headache similar?"      *No Answer* 8. HEAD INJURY: "Has there been any recent injury to the head?"      *No Answer* 9. OTHER SYMPTOMS: "Do you have any other symptoms?" (fever, stiff neck, eye pain, sore throat, cold symptoms)     *No Answer* 10. PREGNANCY: "Is there any chance you are pregnant?" "When was your last menstrual period?"       *No Answer*  Protocols used: Headache-A-AH

## 2020-12-29 NOTE — Telephone Encounter (Signed)
Patient has been advised. KW 

## 2020-12-29 NOTE — Telephone Encounter (Signed)
Please advise 

## 2020-12-29 NOTE — Telephone Encounter (Signed)
Pt called and stated that she tested positive for covid. Pt would like to know what she should do. Please advise   Pt. Tested positive for COVID 19 Wednesday. Has productive cough, headache, fatigue, fever. Has only taken Tylenol. "I don't know what else to take fir the cough and congestion with my high BP." No availability for virtual visit. Requesting medication be sent to pharmacy. Please advise.   Reason for Disposition  MILD difficulty breathing (e.g., minimal/no SOB at rest, SOB with walking, pulse <100)  Answer Assessment - Initial Assessment Questions 1. COVID-19 DIAGNOSIS: "Who made your COVID-19 diagnosis?" "Was it confirmed by a positive lab test or self-test?" If not diagnosed by a doctor (or NP/PA), ask "Are there lots of cases (community spread) where you live?" Note: See public health department website, if unsure.     Home 2. COVID-19 EXPOSURE: "Was there any known exposure to COVID before the symptoms began?" CDC Definition of close contact: within 6 feet (2 meters) for a total of 15 minutes or more over a 24-hour period.      No 3. ONSET: "When did the COVID-19 symptoms start?"      Wednesday 4. WORST SYMPTOM: "What is your worst symptom?" (e.g., cough, fever, shortness of breath, muscle aches)     Headache 5. COUGH: "Do you have a cough?" If Yes, ask: "How bad is the cough?"       Yes 6. FEVER: "Do you have a fever?" If Yes, ask: "What is your temperature, how was it measured, and when did it start?"     Yes - 102 yesterday 7. RESPIRATORY STATUS: "Describe your breathing?" (e.g., shortness of breath, wheezing, unable to speak)      Shortness of breath 8. BETTER-SAME-WORSE: "Are you getting better, staying the same or getting worse compared to yesterday?"  If getting worse, ask, "In what way?"     Same 9. HIGH RISK DISEASE: "Do you have any chronic medical problems?" (e.g., asthma, heart or lung disease, weak immune system, obesity, etc.)     HTN 10. VACCINE: "Have you  had the COVID-19 vaccine?" If Yes, ask: "Which one, how many shots, when did you get it?"       Yes 11. BOOSTER: "Have you received your COVID-19 booster?" If Yes, ask: "Which one and when did you get it?"       No 12. PREGNANCY: "Is there any chance you are pregnant?" "When was your last menstrual period?"       No 13. OTHER SYMPTOMS: "Do you have any other symptoms?"  (e.g., chills, fatigue, headache, loss of smell or taste, muscle pain, sore throat)       Headache, fatigue 14. O2 SATURATION MONITOR:  "Do you use an oxygen saturation monitor (pulse oximeter) at home?" If Yes, ask "What is your reading (oxygen level) today?" "What is your usual oxygen saturation reading?" (e.g., 95%)       No  Protocols used: Coronavirus (COVID-19) Diagnosed or Suspected-A-AH

## 2021-01-02 ENCOUNTER — Encounter: Payer: Self-pay | Admitting: Family Medicine

## 2021-01-11 ENCOUNTER — Other Ambulatory Visit: Payer: Self-pay | Admitting: Physician Assistant

## 2021-01-11 ENCOUNTER — Ambulatory Visit
Admission: RE | Admit: 2021-01-11 | Payer: No Typology Code available for payment source | Source: Ambulatory Visit | Admitting: Gastroenterology

## 2021-01-11 ENCOUNTER — Other Ambulatory Visit: Payer: Self-pay | Admitting: Family Medicine

## 2021-01-11 ENCOUNTER — Encounter: Admission: RE | Payer: Self-pay | Source: Ambulatory Visit

## 2021-01-11 ENCOUNTER — Encounter: Payer: Self-pay | Admitting: Family Medicine

## 2021-01-11 SURGERY — COLONOSCOPY WITH PROPOFOL
Anesthesia: General

## 2021-01-12 ENCOUNTER — Ambulatory Visit: Payer: Self-pay | Admitting: *Deleted

## 2021-01-12 ENCOUNTER — Other Ambulatory Visit: Payer: Self-pay

## 2021-01-12 ENCOUNTER — Ambulatory Visit
Admission: EM | Admit: 2021-01-12 | Discharge: 2021-01-12 | Disposition: A | Discharge status: Home / Self Care | Payer: No Typology Code available for payment source | Attending: Emergency Medicine | Admitting: Emergency Medicine

## 2021-01-12 ENCOUNTER — Encounter: Payer: Self-pay | Admitting: Emergency Medicine

## 2021-01-12 ENCOUNTER — Other Ambulatory Visit: Payer: Self-pay | Admitting: Family Medicine

## 2021-01-12 ENCOUNTER — Telehealth: Payer: No Typology Code available for payment source | Admitting: Emergency Medicine

## 2021-01-12 ENCOUNTER — Ambulatory Visit (INDEPENDENT_AMBULATORY_CARE_PROVIDER_SITE_OTHER): Payer: No Typology Code available for payment source

## 2021-01-12 DIAGNOSIS — R059 Cough, unspecified: Secondary | ICD-10-CM | POA: Diagnosis not present

## 2021-01-12 DIAGNOSIS — U071 COVID-19: Secondary | ICD-10-CM

## 2021-01-12 DIAGNOSIS — Z8616 Personal history of COVID-19: Secondary | ICD-10-CM | POA: Diagnosis not present

## 2021-01-12 DIAGNOSIS — R0609 Other forms of dyspnea: Secondary | ICD-10-CM

## 2021-01-12 DIAGNOSIS — R0602 Shortness of breath: Secondary | ICD-10-CM | POA: Diagnosis not present

## 2021-01-12 DIAGNOSIS — J208 Acute bronchitis due to other specified organisms: Secondary | ICD-10-CM | POA: Diagnosis not present

## 2021-01-12 MED ORDER — ONDANSETRON HCL 4 MG PO TABS: 4.0000 mg | ORAL_TABLET | Freq: Three times a day (TID) | ORAL | 0 refills | Status: DC | PRN

## 2021-01-12 MED ORDER — ALBUTEROL SULFATE HFA 108 (90 BASE) MCG/ACT IN AERS: 1.0000 | INHALATION_SPRAY | Freq: Four times a day (QID) | RESPIRATORY_TRACT | 0 refills | Status: DC | PRN

## 2021-01-12 MED ORDER — PREDNISONE 10 MG PO TABS: ORAL_TABLET | ORAL | 0 refills | Status: AC

## 2021-01-12 NOTE — Discharge Instructions (Signed)
Take prednisone and use proair inhaler as prescribed  Rest, push lots of fluids (especially water), and utilize supportive care for symptoms. You may take take acetaminophen (Tylenol) every 4-6 hours or ibuprofen every 6-8 hours for muscle pain, joint pain, headaches. Mucinex (guaifenesin) may be taken over the counter for cough as needed and can loosen phlegm. Please read the instructions and take as directed. Saline nasal sprays to rinse congestion can help as well. Warm tea with lemon and honey can sooth sore throat and cough, as can cough drops.  Take Mucinex for cough.   Return to clinic for new-onset fever, difficulty breathing, chest pain, symptoms lasting >3 to 4 weeks, or bloody sputum.

## 2021-01-12 NOTE — Telephone Encounter (Signed)
Patient is calling to report she is still not feeling well: Dizzy, 95% O2 sat, chest congestion. Patient states she was treated with antiviral and her symptoms did improve- but she has lingering cough, chest congestion, dizziness-O2 sat fluctuations in 90's and fatigue. Advised follow up virtual visit. Reason for Disposition  [1] PERSISTING SYMPTOMS OF COVID-19 AND [2] symptoms WORSE  Answer Assessment - Initial Assessment Questions 1. COVID-19 ONSET: "When did the symptoms of COVID-19 first start?"     3 weeks ago 2. DIAGNOSIS CONFIRMATION: "How were you diagnosed?" (e.g., COVID-19 oral or nasal viral test; COVID-19 antibody test; doctor visit)     + home test 3. MAIN SYMPTOM:  "What is your main concern or symptom right now?" (e.g., breathing difficulty, cough, fatigue. loss of smell)     Dizziness, chest congestion 4. SYMPTOM ONSET: "When did the  dizziness  start?"     yesterday 5. BETTER-SAME-WORSE: "Are you getting better, staying the same, or getting worse over the last 1 to 2 weeks?"     Better than was- not completely 6. RECENT MEDICAL VISIT: "Have you been seen by a healthcare provider (doctor, NP, PA) for these persisting COVID-19 symptoms?" If Yes, ask: "When were you seen?" (e.g., date)     Virtual- 3 weeks ago 7. COUGH: "Do you have a cough?" If Yes, ask: "How bad is the cough?"       Yes-not productive 8. FEVER: "Do you have a fever?" If Yes, ask: "What is your temperature, how was it measured, and when did it start?"     no 9. BREATHING DIFFICULTY: "Are you having any trouble breathing?" If Yes, ask: "How bad is your breathing?" (e.g., mild, moderate, severe)    - MILD: No SOB at rest, mild SOB with walking, speaks normally in sentences, can lie down, no retractions, pulse < 100.    - MODERATE: SOB at rest, SOB with minimal exertion and prefers to sit, cannot lie down flat, speaks in phrases, mild retractions, audible wheezing, pulse 100-120.    - SEVERE: Very SOB at rest,  speaks in single words, struggling to breathe, sitting hunched forward, retractions, pulse > 120       At times SOB- exertion 10. HIGH RISK DISEASE: "Do you have any chronic medical problems?" (e.g., asthma, heart or lung disease, weak immune system, obesity, etc.)       High cholesterol, BP 11. VACCINE: "Have you gotten the COVID-19 vaccine?" If Yes, ask: "Which one, how many shots, when did you get it?"       First dose only- moderna 12. BOOSTER: "Have you received your COVID-19 booster?" If Yes, ask: "Which one and when did you get it?"       no 13. PREGNANCY: "Is there any chance you are pregnant?" "When was your last menstrual period?"       no 14. OTHER SYMPTOMS: "Do you have any other symptoms?"  (e.g., fatigue, headache, muscle pain, weakness)       Fatigue, no taste/smell 15. O2 SATURATION MONITOR:  "Do you use an oxygen saturation monitor (pulse oximeter) at home?" If Yes, ask "What is your reading (oxygen level) today?" "What is your usual oxygen saturation reading?" (e.g., 95%)       96%, P 80  Protocols used: Coronavirus (COVID-19) Persisting Symptoms Follow-up Call-[facility]

## 2021-01-12 NOTE — ED Provider Notes (Signed)
CHIEF COMPLAINT:   Chief Complaint  Patient presents with   Cough   Nasal Congestion     SUBJECTIVE/HPI:   Cough A very pleasant 60 y.o.Female presents today with cough and fatigue.  Patient reports that she was having a productive cough, but states that now she is not able to produce phlegm.  Patient states that she has not been drinking very much outside of Pepsi and very little water intake.  Patient reports that she was treated with Paxlovid for COVID-19 about 2 weeks ago. Patient does not report any shortness of breath, chest pain, palpitations, visual changes, weakness, tingling, headache, nausea, vomiting, diarrhea, fever, chills.   has a past medical history of Allergy, Anxiety, Diffuse cystic mastopathy, GERD (gastroesophageal reflux disease), High cholesterol, and PONV (postoperative nausea and vomiting).  ROS:  Review of Systems  Respiratory:  Positive for cough.   See Subjective/HPI Medications, Allergies and Problem List personally reviewed in Epic today OBJECTIVE:   Vitals:   01/12/21 1513  BP: 118/82  Pulse: 71  Resp: 20  Temp: 98.1 F (36.7 C)  SpO2: 93%    Physical Exam   General: Appears well-developed and well-nourished. No acute distress.  HEENT Head: Normocephalic and atraumatic.   Ears: Hearing grossly intact, no drainage or visible deformity.  Nose: No nasal deviation.   Mouth/Throat: No stridor or tracheal deviation.  Mildly erythematous posterior pharynx noted with clear drainage present.  No white patchy exudate noted. Eyes: Conjunctivae and EOM are normal. No eye drainage or scleral icterus bilaterally.  Neck: Normal range of motion, neck is supple.  Cardiovascular: Normal rate. Regular rhythm; no murmurs, gallops, or rubs.  Pulm/Chest: No respiratory distress. Mild rhonchi noted to bilateral lower lung fields with bilateral upper lung fields CTA. Intermittent forceful cough noted. Neurological: Alert and oriented to person, place, and time.   Skin: Skin is warm and dry.  No rashes, lesions, abrasions or bruising noted to skin.   Psychiatric: Normal mood, affect, behavior, and thought content.   Vital signs and nursing note reviewed.   Patient stable and cooperative with examination. PROCEDURES:    LABS/X-RAYS/EKG/MEDS:   No results found for any visits on 01/12/21.  MEDICAL DECISION MAKING:   Patient presents with cough and fatigue.  Patient reports that she was having a productive cough, but states that now she is not able to produce phlegm.  Patient states that she has not been drinking very much outside of Pepsi and very little water intake.  Patient reports that she was treated with Paxlovid for COVID-19 about 2 weeks ago. Patient does not report any shortness of breath, chest pain, palpitations, visual changes, weakness, tingling, headache, nausea, vomiting, diarrhea, fever, chills.  Chest x-ray reveals no pneumonia or pneumothorax, but there is peribronchial cuffing suggestive of a viral etiology.  Rx prednisone and proair for likely bronchitis after COVID-19.  No concern for underlying bacterial etiology which would require antibiotics at this time.  Advised to rest, push fluids, Tylenol, ibuprofen and Mucinex.  Return to clinic for any new onset fever, difficulty breathing, chest pain, symptoms lasting longer than 3 to 4 weeks or bloody sputum.  Patient verbalized understanding and agreed with treatment plan.  Patient stable upon discharge. ASSESSMENT/PLAN:  1. Viral bronchitis - predniSONE (DELTASONE) 10 MG tablet; Take 6 tablets (60 mg total) by mouth daily for 1 day, THEN 5 tablets (50 mg total) daily for 1 day, THEN 4 tablets (40 mg total) daily for 1 day, THEN 3 tablets (30 mg  total) daily for 1 day, THEN 2 tablets (20 mg total) daily for 1 day, THEN 1 tablet (10 mg total) daily for 1 day.  Dispense: 21 tablet; Refill: 0 - albuterol (VENTOLIN HFA) 108 (90 Base) MCG/ACT inhaler; Inhale 1-2 puffs into the lungs every 6  (six) hours as needed (cough).  Dispense: 6.7 g; Refill: 0  2. History of COVID-19 Instructions about new medications and side effects provided.  Plan:   Discharge Instructions      Take prednisone and use proair inhaler as prescribed  Rest, push lots of fluids (especially water), and utilize supportive care for symptoms. You may take take acetaminophen (Tylenol) every 4-6 hours or ibuprofen every 6-8 hours for muscle pain, joint pain, headaches. Mucinex (guaifenesin) may be taken over the counter for cough as needed and can loosen phlegm. Please read the instructions and take as directed. Saline nasal sprays to rinse congestion can help as well. Warm tea with lemon and honey can sooth sore throat and cough, as can cough drops.  Take Mucinex for cough.   Return to clinic for new-onset fever, difficulty breathing, chest pain, symptoms lasting >3 to 4 weeks, or bloody sputum.          Amalia Greenhouse, FNP 01/12/21 1538

## 2021-01-12 NOTE — Patient Instructions (Signed)
Ms. Felmlee I think you need to be seen in person today. The nearest [facility] for you is the one in [place] at [address].

## 2021-01-12 NOTE — Progress Notes (Signed)
Virtual Visit Consent   Kristin Aguirre, you are scheduled for a virtual visit with a [facility] provider today.     Just as with appointments in the office, your consent must be obtained to participate.  Your consent will be active for this visit and any virtual visit you may have with one of our providers in the next 365 days.     If you have a MyChart account, a copy of this consent can be sent to you electronically.  All virtual visits are billed to your insurance company just like a traditional visit in the office.    As this is a virtual visit, video technology does not allow for your provider to perform a traditional examination.  This may limit your provider's ability to fully assess your condition.  If your provider identifies any concerns that need to be evaluated in person or the need to arrange testing (such as labs, EKG, etc.), we will make arrangements to do so.     Although advances in technology are sophisticated, we cannot ensure that it will always work on either your end or our end.  If the connection with a video visit is poor, the visit may have to be switched to a telephone visit.  With either a video or telephone visit, we are not always able to ensure that we have a secure connection.     I need to obtain your verbal consent now.   Are you willing to proceed with your visit today?    Kristin Aguirre has provided verbal consent on 01/12/2021 for a virtual visit (video or telephone).   Carvel Getting, NP   Date: 01/12/2021 11:10 AM   Virtual Visit via Video Note   I, Carvel Getting, connected with  Kristin Aguirre  ([id number], June 22, 1960) on 01/12/21 at 11:00 AM EDT by a video-enabled telemedicine application and verified that I am speaking with the correct person using two identifiers.  Location: Patient: Virtual Visit Location Patient: Home Provider: Virtual Visit Location Provider: Home Office   I discussed the limitations of evaluation and management by  telemedicine and the availability of in person appointments. The patient expressed understanding and agreed to proceed.    History of Present Illness: Kristin Aguirre is a 60 y.o. who identifies as a female who was assigned female at birth, and is being seen today for chest congestion and dyspnea on exertion.  Patient was diagnosed with COVID and treated with paxlovid over the couple of weeks ago but still feels weak, fatigued, dizzy, and short of breath.  She reports dyspnea on exertion just walking around her house.  HPI: HPI  Problems:  Patient Active Problem List   Diagnosis Date Noted   COVID-19 12/29/2020   Intractable migraine without aura and without status migrainosus 10/20/2020   Colon cancer screening 10/20/2020   Essential hypertension 05/08/2018   Obesity (BMI 35.0-39.9 without comorbidity) 05/06/2018   Primary osteoarthritis of both knees 04/11/2017   Allergic rhinitis 01/31/2015   Airway hyperreactivity 01/31/2015   Atypical chest pain 01/31/2015   Baker's cyst of knee 01/31/2015   Chest pressure 01/31/2015   Chronic headache 01/31/2015   Tobacco use 01/31/2015   Contact with and suspected exposure to infections with predominantly sexual mode of transmission 01/31/2015   Acid reflux 01/31/2015   Postsurgical menopause 01/31/2015   Avitaminosis D 01/31/2015   Low back pain 11/29/2014   Anxiety 08/26/2014   Anxiety, generalized 01/14/2006   Hyperlipidemia 01/14/2006  Allergies:  Allergies  Allergen Reactions   Furosemide Rash   Medications:  Current Outpatient Medications:    ALPRAZolam (XANAX) 0.5 MG tablet, TAKE 1 TABLET BY MOUTH TWICE DAILY AS NEEDED ANXIETY, Disp: 60 tablet, Rfl: 0   Ascorbic Acid (VITAMIN C PO), Take 1 tablet by mouth daily at 12 noon., Disp: , Rfl:    aspirin EC 81 MG tablet, Take 81 mg by mouth daily. Swallow whole., Disp: , Rfl:    citalopram (CELEXA) 40 MG tablet, Take 1 tablet (40 mg total) by mouth at bedtime., Disp: 90 tablet, Rfl:  3   diclofenac Sodium (VOLTAREN) 1 % GEL, Apply 4 g topically 4 (four) times daily as needed., Disp: 100 g, Rfl: 3   fluticasone (FLONASE) 50 MCG/ACT nasal spray, Place 2 sprays into both nostrils daily., Disp: 48 g, Rfl: 1   hydrochlorothiazide (HYDRODIURIL) 25 MG tablet, Take 1 tablet (25 mg total) by mouth daily., Disp: 90 tablet, Rfl: 0   lisinopril (ZESTRIL) 10 MG tablet, Take 1 tablet (10 mg total) by mouth daily., Disp: 90 tablet, Rfl: 3   Multiple Vitamins-Minerals (ALIVE ONCE DAILY WOMENS PO), Take 2 tablets by mouth daily at 12 noon. gummies, Disp: , Rfl:    ondansetron (ZOFRAN) 4 MG tablet, Take 1 tablet (4 mg total) by mouth every 8 (eight) hours as needed for nausea or vomiting., Disp: 20 tablet, Rfl: 0   oxyCODONE-acetaminophen (PERCOCET) 7.5-325 MG tablet, Take 1 tablet by mouth every 4 (four) hours as needed for severe pain., Disp: 120 tablet, Rfl: 0   pantoprazole (PROTONIX) 40 MG tablet, TAKE 1 TABLET BY MOUTH ONCE DAILY (Patient taking differently: Take 40 mg by mouth daily at 12 noon.), Disp: 90 tablet, Rfl: 1   propranolol (INDERAL) 20 MG tablet, Take 1 tablet (20 mg total) by mouth 2 (two) times daily., Disp: 60 tablet, Rfl: 1   rosuvastatin (CRESTOR) 10 MG tablet, Take 1 tablet (10 mg total) by mouth daily., Disp: 90 tablet, Rfl: 3   Sod Picosulfate-Mag Ox-Cit Acd (CLENPIQ) 10-3.5-12 MG-GM -GM/160ML SOLN, Take 1 kit by mouth as directed. At 5 PM evening before procedure, drink 1 bottle of Clenpiq, hydrate, drink (5) 8 oz of water. Then do the same thing 5 hours prior to your procedure., Disp: 320 mL, Rfl: 0   SUMAtriptan (IMITREX) 50 MG tablet, Take 1 tablet (50 mg total) by mouth every 2 (two) hours as needed for migraine (2 doses max per day). May repeat in 2 hours if headache persists or recurs., Disp: 40 tablet, Rfl: 3   Vitamin D, Ergocalciferol, (DRISDOL) 1.25 MG (50000 UNIT) CAPS capsule, TAKE 1 CAPSULE BY MOUTH ONCE A WEEK, Disp: 12 capsule, Rfl:  1  Observations/Objective: Patient is well-developed, well-nourished in no acute distress.  Resting  at home.  Head is normocephalic, atraumatic.  No labored breathing but patient with frequent wet sounding prolonged coughing spells.   Speech is clear and coherent with logical content.  Patient is alert and oriented at baseline.  Patient appears ill.  Assessment and Plan: 1. DOE (dyspnea on exertion)  2. COVID  Patient was not able to get an appointment with her PCP today which is why she tried this virtual visit.  I instructed patient she needs more urgent in person care and directed her to the nearest [facility] health urgent care in [place].  Patient reports she will had there right now.  Follow Up Instructions: I discussed the assessment and treatment plan with the patient. The patient was  provided an opportunity to ask questions and all were answered. The patient agreed with the plan and demonstrated an understanding of the instructions.  A copy of instructions were sent to the patient via MyChart unless otherwise noted below.   The patient was advised to call back or seek an in-person evaluation if the symptoms worsen or if the condition fails to improve as anticipated.  Time:  I spent 8 minutes with the patient via telehealth technology discussing the above problems/concerns.    Carvel Getting, NP

## 2021-01-12 NOTE — ED Triage Notes (Signed)
Pt here post covid and paxlovid tx 2 weeks ago. Started to feel better and then sx came back worse. Cough and fatigue. States it was productive and now it is not. Since she has not been drinking much besides pepsi and very little water.

## 2021-01-15 ENCOUNTER — Encounter: Payer: Self-pay | Admitting: Family Medicine

## 2021-01-25 NOTE — Progress Notes (Deleted)
NEUROLOGY CONSULTATION NOTE  Kristin Aguirre MRN: [id number] DOB: January 06, 1961  Referring provider: Tally Joe, FNP Primary care provider: Tally Joe, FNP  Reason for consult:  migraines  Assessment/Plan:   ***   Subjective:  Kristin Aguirre is a 60 year old female with osteoarthritis and anxiety who presents for migraines.  History supplemented by referring provider's note.  Onset:  *** Location:  *** Quality:  *** Intensity:  ***.  She denies new headache, thunderclap headache or severe headache that wakes *** from sleep. Aura:  *** Prodrome:  *** Associated symptoms:  ***.  *** denies associated unilateral numbness or weakness. Duration:  *** Frequency:  *** Frequency of abortive medication: *** Triggers:  *** Relieving factors:  *** Activity:  ***  Current NSAIDS/analgesics:  ASA 26m daily, oxycodone-acetaminophen (***) Current triptans:  sumatriptan 5110mCurrent ergotamine:  none Current anti-emetic:  ondansetron 29m629murrent muscle relaxants:  none Current Antihypertensive medications:  propranolol 68m70mD, lisinopril, HCTZ Current Antidepressant medications:  citalopram 40mg8m Current Anticonvulsant medications:  none Current anti-CGRP:  none Current Vitamins/Herbal/Supplements:  C, MVI, D Current Antihistamines/Decongestants:  Flonase Other therapy:  *** Hormone/birth control:  none Other medications:  alprazolam (anxiety)  Past NSAIDS/analgesics:  Fioricet, naproxen, etodolac, meloxicam, hydrocodone,-acetaminophen Past abortive triptans:  *** Past abortive ergotamine:  none Past muscle relaxants:  none Past anti-emetic:  none Past antihypertensive medications:  amlodipine Past antidepressant medications:  *** Past anticonvulsant medications:  *** Past anti-CGRP:  none Past vitamins/Herbal/Supplements:  none Past antihistamines/decongestants:  none Other past therapies:  ***  Caffeine:  *** Alcohol:  *** Smoker:  *** Diet:  *** Exercise:   *** Depression:  ***; Anxiety:  *** Other pain:  *** Sleep hygiene:  *** Family history of headache:  ***      PAST MEDICAL HISTORY: Past Medical History:  Diagnosis Date   Allergy    Anxiety    Diffuse cystic mastopathy    LEFT SIDE   GERD (gastroesophageal reflux disease)    High cholesterol    PONV (postoperative nausea and vomiting)     PAST SURGICAL HISTORY: Past Surgical History:  Procedure Laterality Date   ABDOMINAL HYSTERECTOMY  2003   BREAST BIOPSY Left 2011, 2015   neg   BREAST BIOPSY Left 07/17/2009   Vacuum biopsy, 4:00 position: 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   cervical spine repair      CESAREAN SECTION     TONSILLECTOMY      MEDICATIONS: Current Outpatient Medications on File Prior to Visit  Medication Sig Dispense Refill   albuterol (VENTOLIN HFA) 108 (90 Base) MCG/ACT inhaler Inhale 1-2 puffs into the lungs every 6 (six) hours as needed (cough). 6.7 g 0   ALPRAZolam (XANAX) 0.5 MG tablet TAKE 1 TABLET BY MOUTH TWICE DAILY AS NEEDED ANXIETY 60 tablet 0   Ascorbic Acid (VITAMIN C PO) Take 1 tablet by mouth daily at 12 noon.     aspirin EC 81 MG tablet Take 81 mg by mouth daily. Swallow whole.     citalopram (CELEXA) 40 MG tablet Take 1 tablet (40 mg total) by mouth at bedtime. 90 tablet 3   diclofenac Sodium (VOLTAREN) 1 % GEL Apply 4 g topically 4 (four) times daily as needed. 100 g 3   fluticasone (FLONASE) 50 MCG/ACT nasal spray Place 2 sprays into both nostrils daily. 48 g 1   hydrochlorothiazide (HYDRODIURIL) 25 MG tablet Take 1 tablet (25 mg total) by mouth  daily. 90 tablet 0   lisinopril (ZESTRIL) 10 MG tablet Take 1 tablet (10 mg total) by mouth daily. 90 tablet 3   Multiple Vitamins-Minerals (ALIVE ONCE DAILY WOMENS PO) Take 2 tablets by mouth daily at 12 noon. gummies     ondansetron (ZOFRAN) 4 MG tablet Take 1 tablet (4 mg total) by mouth every 8  (eight) hours as needed for nausea or vomiting. 20 tablet 0   oxyCODONE-acetaminophen (PERCOCET) 7.5-325 MG tablet Take 1 tablet by mouth every 4 (four) hours as needed for severe pain. 120 tablet 0   pantoprazole (PROTONIX) 40 MG tablet TAKE 1 TABLET BY MOUTH ONCE DAILY (Patient taking differently: Take 40 mg by mouth daily at 12 noon.) 90 tablet 1   propranolol (INDERAL) 20 MG tablet Take 1 tablet (20 mg total) by mouth 2 (two) times daily. 60 tablet 1   rosuvastatin (CRESTOR) 10 MG tablet Take 1 tablet (10 mg total) by mouth daily. 90 tablet 3   Sod Picosulfate-Mag Ox-Cit Acd (CLENPIQ) 10-3.5-12 MG-GM -GM/160ML SOLN Take 1 kit by mouth as directed. At 5 PM evening before procedure, drink 1 bottle of Clenpiq, hydrate, drink (5) 8 oz of water. Then do the same thing 5 hours prior to your procedure. 320 mL 0   SUMAtriptan (IMITREX) 50 MG tablet Take 1 tablet (50 mg total) by mouth every 2 (two) hours as needed for migraine (2 doses max per day). May repeat in 2 hours if headache persists or recurs. 40 tablet 3   Vitamin D, Ergocalciferol, (DRISDOL) 1.25 MG (50000 UNIT) CAPS capsule TAKE 1 CAPSULE BY MOUTH ONCE A WEEK 4 capsule 0   No current facility-administered medications on file prior to visit.    ALLERGIES: Allergies  Allergen Reactions   Furosemide Rash    FAMILY HISTORY: Family History  Problem Relation Age of Onset   Heart block Mother    Cancer Mother        on leg   Diabetes Mother    Heart disease Mother    Diabetes Father    Heart disease Father    CVA Father    Asthma Sister    Anemia Sister    Seizures Brother    Breast cancer Maternal Grandmother 55    Objective:  *** General: No acute distress.  Patient appears well-groomed.   Head:  Normocephalic/atraumatic Eyes:  fundi examined but not visualized Neck: supple, no paraspinal tenderness, full range of motion Back: No paraspinal tenderness Heart: regular rate and rhythm Lungs: Clear to auscultation  bilaterally. Vascular: No carotid bruits. Neurological Exam: Mental status: alert and oriented to person, place, and time, recent and remote memory intact, fund of knowledge intact, attention and concentration intact, speech fluent and not dysarthric, language intact. Cranial nerves: CN I: not tested CN II: pupils equal, round and reactive to light, visual fields intact CN III, IV, VI:  full range of motion, no nystagmus, no ptosis CN V: facial sensation intact. CN VII: upper and lower face symmetric CN VIII: hearing intact CN IX, X: gag intact, uvula midline CN XI: sternocleidomastoid and trapezius muscles intact CN XII: tongue midline Bulk & Tone: normal, no fasciculations. Motor:  muscle strength 5/5 throughout Sensation:  Pinprick, temperature and vibratory sensation intact. Deep Tendon Reflexes:  2+ throughout,  toes downgoing.   Finger to nose testing:  Without dysmetria.   Heel to shin:  Without dysmetria.   Gait:  Normal station and stride.  Romberg negative.    Thank you for allowing  me to take part in the care of this patient.  Metta Clines, DO  CC: ***

## 2021-01-26 ENCOUNTER — Other Ambulatory Visit: Payer: Self-pay | Admitting: Family Medicine

## 2021-01-26 ENCOUNTER — Ambulatory Visit: Payer: No Typology Code available for payment source | Admitting: Neurology

## 2021-01-26 DIAGNOSIS — M170 Bilateral primary osteoarthritis of knee: Secondary | ICD-10-CM

## 2021-01-26 MED ORDER — OXYCODONE-ACETAMINOPHEN 7.5-325 MG PO TABS: 1.0000 | ORAL_TABLET | ORAL | 0 refills | Status: DC | PRN

## 2021-01-29 ENCOUNTER — Other Ambulatory Visit: Payer: Self-pay | Admitting: Family Medicine

## 2021-01-29 DIAGNOSIS — F419 Anxiety disorder, unspecified: Secondary | ICD-10-CM

## 2021-01-29 NOTE — Telephone Encounter (Signed)
Requested medications are due for refill today.  yes  Requested medications are on the active medications list.  yes  Last refill. 12/29/2020  Future visit scheduled.   no  Notes to clinic.  Medication not delegated.

## 2021-01-30 NOTE — Telephone Encounter (Signed)
LOV:  12/01/2020 

## 2021-02-02 ENCOUNTER — Encounter: Payer: Self-pay | Admitting: Family Medicine

## 2021-02-17 ENCOUNTER — Other Ambulatory Visit: Payer: Self-pay | Admitting: Family Medicine

## 2021-02-21 MED ORDER — ONDANSETRON HCL 4 MG PO TABS: 4.0000 mg | ORAL_TABLET | Freq: Three times a day (TID) | ORAL | 0 refills | Status: DC | PRN

## 2021-02-23 ENCOUNTER — Other Ambulatory Visit: Payer: Self-pay | Admitting: Family Medicine

## 2021-02-23 DIAGNOSIS — M170 Bilateral primary osteoarthritis of knee: Secondary | ICD-10-CM

## 2021-02-23 NOTE — Telephone Encounter (Signed)
Pt called in stating she is really needing this medication sent over and will be going out of town over the weekend

## 2021-02-26 MED ORDER — OXYCODONE-ACETAMINOPHEN 7.5-325 MG PO TABS: 1.0000 | ORAL_TABLET | ORAL | 0 refills | Status: DC | PRN

## 2021-03-03 ENCOUNTER — Other Ambulatory Visit: Payer: Self-pay | Admitting: Family Medicine

## 2021-03-03 DIAGNOSIS — F419 Anxiety disorder, unspecified: Secondary | ICD-10-CM

## 2021-03-04 NOTE — Telephone Encounter (Signed)
Requested medication (s) are due for refill today: no  Requested medication (s) are on the active medication list: yes  Last refill:  01/30/21 #60 1 RF  Future visit scheduled: no  Notes to clinic:  med not delegated to NT to RF   Requested Prescriptions  Pending Prescriptions Disp Refills   ALPRAZolam (XANAX) 0.5 MG tablet [Pharmacy Med Name: ALPRAZOLAM 0.5 MG TAB] 60 tablet     Sig: TAKE 1 TABLET BY MOUTH TWICE DAILY AS NEEDED ANXIETY     Not Delegated - Psychiatry:  Anxiolytics/Hypnotics Failed - 03/03/2021  2:19 PM      Failed - This refill cannot be delegated      Failed - Urine Drug Screen completed in last 360 days      Passed - Valid encounter within last 6 months    Recent Outpatient Visits           3 months ago Colon cancer screening   [facility] Merita Norton T, FNP   4 months ago Intractable migraine without aura and without status migrainosus   [facility] Jacky Kindle, FNP   1 year ago Annual physical exam   [facility], Alessandra Bevels, [place]   1 year ago Acute anxiety   [facility], [place], [place]   2 years ago Essential hypertension   [facility], [place], [place]       Future Appointments             In 1 month Drema Dallas, DO [facility] [place]

## 2021-03-28 ENCOUNTER — Other Ambulatory Visit: Payer: Self-pay | Admitting: Family Medicine

## 2021-03-28 DIAGNOSIS — E669 Obesity, unspecified: Secondary | ICD-10-CM

## 2021-03-28 DIAGNOSIS — M170 Bilateral primary osteoarthritis of knee: Secondary | ICD-10-CM

## 2021-03-28 DIAGNOSIS — F1190 Opioid use, unspecified, uncomplicated: Secondary | ICD-10-CM

## 2021-03-28 MED ORDER — OXYCODONE-ACETAMINOPHEN 7.5-325 MG PO TABS: 1.0000 | ORAL_TABLET | Freq: Four times a day (QID) | ORAL | 0 refills | Status: DC | PRN

## 2021-04-09 ENCOUNTER — Other Ambulatory Visit: Payer: Self-pay | Admitting: Family Medicine

## 2021-04-09 DIAGNOSIS — F419 Anxiety disorder, unspecified: Secondary | ICD-10-CM

## 2021-04-12 ENCOUNTER — Other Ambulatory Visit: Payer: Self-pay | Admitting: Family Medicine

## 2021-04-23 ENCOUNTER — Other Ambulatory Visit: Payer: Self-pay | Admitting: Family Medicine

## 2021-04-23 DIAGNOSIS — M170 Bilateral primary osteoarthritis of knee: Secondary | ICD-10-CM

## 2021-04-23 MED ORDER — OXYCODONE-ACETAMINOPHEN 7.5-325 MG PO TABS: 1.0000 | ORAL_TABLET | Freq: Three times a day (TID) | ORAL | 0 refills | Status: DC | PRN

## 2021-04-23 NOTE — Progress Notes (Deleted)
NEUROLOGY CONSULTATION NOTE  Kristin Aguirre MRN: [id number] DOB: 02/28/1961  Referring provider: Tally Joe, FNP Primary care provider: Tally Joe, FNP  Reason for consult:  migraines  Assessment/Plan:   ***   Subjective:  Kristin Aguirre is a 61 year old female who presents for migraines.  History supplemented by referring provider's note.  Onset:  *** Location:  *** Quality:  *** Intensity:  ***.  *** denies new headache, thunderclap headache or severe headache that wakes *** from sleep. Aura:  *** Prodrome:  *** Postdrome:  *** Associated symptoms:  ***.  *** denies associated unilateral numbness or weakness. Duration:  *** Frequency:  *** Frequency of abortive medication: *** Triggers:  *** Relieving factors:  *** Activity:  ***  Current NSAIDS/analgesics:  Tylenol, ibuprofen, BC, ASA $Remov'81mg'AsEdXr$  daily Current triptans:  sumatriptan $RemoveBefo'50mg'pALtOOumqOU$  Current ergotamine:  none Current anti-emetic:  Zofran $Remov'4mg'ljjyVe$  Current muscle relaxants:  none Current Antihypertensive medications:  propranolol $RemoveBefo'20mg'AlATfIyRwDe$  BID, HCTZ Current Antidepressant medications:  citalopram $RemoveBef'40mg'MvdRHBvAfb$  Current Anticonvulsant medications:  none Current anti-CGRP:  none Current Vitamins/Herbal/Supplements:  MVI, D Current Antihistamines/Decongestants:  Flonase Other therapy:  *** Hormone/birth control:  none   Past NSAIDS/analgesics:  *** Past abortive triptans:  *** Past abortive ergotamine:  none Past muscle relaxants:  none Past anti-emetic:  none Past antihypertensive medications:  amlodipine Past antidepressant medications:  *** Past anticonvulsant medications:  *** Past anti-CGRP:  none Past vitamins/Herbal/Supplements:  none Past antihistamines/decongestants:  none Other past therapies:  none  Caffeine:  *** Alcohol:  *** Smoker:  *** Diet:  *** Exercise:  *** Depression:  ***; Anxiety:  *** Other pain:  *** Sleep hygiene:  *** Family history of headache:  ***      PAST MEDICAL HISTORY: Past  Medical History:  Diagnosis Date   Allergy    Anxiety    Diffuse cystic mastopathy    LEFT SIDE   GERD (gastroesophageal reflux disease)    High cholesterol    PONV (postoperative nausea and vomiting)     PAST SURGICAL HISTORY: Past Surgical History:  Procedure Laterality Date   ABDOMINAL HYSTERECTOMY  2003   BREAST BIOPSY Left 2011, 2015   neg   BREAST BIOPSY Left 07/17/2009   Vacuum biopsy, 4:00 position: 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   cervical spine repair      CESAREAN SECTION     TONSILLECTOMY      MEDICATIONS: Current Outpatient Medications on File Prior to Visit  Medication Sig Dispense Refill   albuterol (VENTOLIN HFA) 108 (90 Base) MCG/ACT inhaler Inhale 1-2 puffs into the lungs every 6 (six) hours as needed (cough). 6.7 g 0   ALPRAZolam (XANAX) 0.5 MG tablet TAKE 1 TABLET BY MOUTH TWICE DAILY AS NEEDED ANXIETY 60 tablet 0   Ascorbic Acid (VITAMIN C PO) Take 1 tablet by mouth daily at 12 noon.     aspirin EC 81 MG tablet Take 81 mg by mouth daily. Swallow whole.     citalopram (CELEXA) 40 MG tablet Take 1 tablet (40 mg total) by mouth at bedtime. 90 tablet 3   diclofenac Sodium (VOLTAREN) 1 % GEL Apply 4 g topically 4 (four) times daily as needed. 100 g 3   fluticasone (FLONASE) 50 MCG/ACT nasal spray Place 2 sprays into both nostrils daily. 48 g 1   hydrochlorothiazide (HYDRODIURIL) 25 MG tablet Take 1 tablet (25 mg total) by mouth daily. 90 tablet 0   lisinopril (ZESTRIL) 10 MG tablet  Take 1 tablet (10 mg total) by mouth daily. 90 tablet 3   Multiple Vitamins-Minerals (ALIVE ONCE DAILY WOMENS PO) Take 2 tablets by mouth daily at 12 noon. gummies     ondansetron (ZOFRAN) 4 MG tablet TAKE 1 TABLET BY MOUTH EVERY 8 HOURS AS NEEDED FOR NAUSEA OR VOMITING 20 tablet 0   oxyCODONE-acetaminophen (PERCOCET) 7.5-325 MG tablet Take 1 tablet by mouth every 6 (six) hours as needed  for severe pain. 100 tablet 0   pantoprazole (PROTONIX) 40 MG tablet TAKE 1 TABLET BY MOUTH ONCE DAILY (Patient taking differently: Take 40 mg by mouth daily at 12 noon.) 90 tablet 1   propranolol (INDERAL) 20 MG tablet Take 1 tablet (20 mg total) by mouth 2 (two) times daily. 60 tablet 1   rosuvastatin (CRESTOR) 10 MG tablet Take 1 tablet (10 mg total) by mouth daily. 90 tablet 3   Sod Picosulfate-Mag Ox-Cit Acd (CLENPIQ) 10-3.5-12 MG-GM -GM/160ML SOLN Take 1 kit by mouth as directed. At 5 PM evening before procedure, drink 1 bottle of Clenpiq, hydrate, drink (5) 8 oz of water. Then do the same thing 5 hours prior to your procedure. 320 mL 0   SUMAtriptan (IMITREX) 50 MG tablet Take 1 tablet (50 mg total) by mouth every 2 (two) hours as needed for migraine (2 doses max per day). May repeat in 2 hours if headache persists or recurs. 40 tablet 3   Vitamin D, Ergocalciferol, (DRISDOL) 1.25 MG (50000 UNIT) CAPS capsule TAKE 1 CAPSULE BY MOUTH ONCE A WEEK 4 capsule 0   No current facility-administered medications on file prior to visit.    ALLERGIES: Allergies  Allergen Reactions   Furosemide Rash    FAMILY HISTORY: Family History  Problem Relation Age of Onset   Heart block Mother    Cancer Mother        on leg   Diabetes Mother    Heart disease Mother    Diabetes Father    Heart disease Father    CVA Father    Asthma Sister    Anemia Sister    Seizures Brother    Breast cancer Maternal Grandmother 55    Objective:  *** General: No acute distress.  Patient appears well-groomed.   Head:  Normocephalic/atraumatic Eyes:  fundi examined but not visualized Neck: supple, no paraspinal tenderness, full range of motion Back: No paraspinal tenderness Heart: regular rate and rhythm Lungs: Clear to auscultation bilaterally. Vascular: No carotid bruits. Neurological Exam: Mental status: alert and oriented to person, place, and time, recent and remote memory intact, fund of knowledge  intact, attention and concentration intact, speech fluent and not dysarthric, language intact. Cranial nerves: CN I: not tested CN II: pupils equal, round and reactive to light, visual fields intact CN III, IV, VI:  full range of motion, no nystagmus, no ptosis CN V: facial sensation intact. CN VII: upper and lower face symmetric CN VIII: hearing intact CN IX, X: gag intact, uvula midline CN XI: sternocleidomastoid and trapezius muscles intact CN XII: tongue midline Bulk & Tone: normal, no fasciculations. Motor:  muscle strength 5/5 throughout Sensation:  Pinprick, temperature and vibratory sensation intact. Deep Tendon Reflexes:  2+ throughout,  toes downgoing.   Finger to nose testing:  Without dysmetria.   Heel to shin:  Without dysmetria.   Gait:  Normal station and stride.  Romberg negative.    Thank you for allowing me to take part in the care of this patient.  Shon Millet, DO  CC: Tally Joe, FNP

## 2021-04-24 ENCOUNTER — Ambulatory Visit: Payer: No Typology Code available for payment source | Admitting: Neurology

## 2021-05-10 ENCOUNTER — Telehealth: Payer: No Typology Code available for payment source | Admitting: Physician Assistant

## 2021-05-10 DIAGNOSIS — B9689 Other specified bacterial agents as the cause of diseases classified elsewhere: Secondary | ICD-10-CM | POA: Diagnosis not present

## 2021-05-10 DIAGNOSIS — J208 Acute bronchitis due to other specified organisms: Secondary | ICD-10-CM

## 2021-05-10 MED ORDER — BENZONATATE 100 MG PO CAPS
100.0000 mg | ORAL_CAPSULE | Freq: Three times a day (TID) | ORAL | 0 refills | Status: DC | PRN
Start: 2021-05-10 — End: 2021-08-15

## 2021-05-10 MED ORDER — AZITHROMYCIN 250 MG PO TABS: ORAL_TABLET | ORAL | 0 refills | Status: AC

## 2021-05-10 NOTE — Patient Instructions (Signed)
Lilia Argue Franey, thank you for joining [name], PA-C for today's virtual visit.  While this provider is not your primary care provider (PCP), if your PCP is located in our provider database this encounter information will be shared with them immediately following your visit.  Consent: (Patient) Kristin Aguirre provided verbal consent for this virtual visit at the beginning of the encounter.  Current Medications:  Current Outpatient Medications:    albuterol (VENTOLIN HFA) 108 (90 Base) MCG/ACT inhaler, Inhale 1-2 puffs into the lungs every 6 (six) hours as needed (cough)., Disp: 6.7 g, Rfl: 0   ALPRAZolam (XANAX) 0.5 MG tablet, TAKE 1 TABLET BY MOUTH TWICE DAILY AS NEEDED ANXIETY, Disp: 60 tablet, Rfl: 0   Ascorbic Acid (VITAMIN C PO), Take 1 tablet by mouth daily at 12 noon., Disp: , Rfl:    aspirin EC 81 MG tablet, Take 81 mg by mouth daily. Swallow whole., Disp: , Rfl:    citalopram (CELEXA) 40 MG tablet, Take 1 tablet (40 mg total) by mouth at bedtime., Disp: 90 tablet, Rfl: 3   diclofenac Sodium (VOLTAREN) 1 % GEL, Apply 4 g topically 4 (four) times daily as needed., Disp: 100 g, Rfl: 3   fluticasone (FLONASE) 50 MCG/ACT nasal spray, Place 2 sprays into both nostrils daily., Disp: 48 g, Rfl: 1   hydrochlorothiazide (HYDRODIURIL) 25 MG tablet, Take 1 tablet (25 mg total) by mouth daily., Disp: 90 tablet, Rfl: 0   lisinopril (ZESTRIL) 10 MG tablet, Take 1 tablet (10 mg total) by mouth daily., Disp: 90 tablet, Rfl: 3   Multiple Vitamins-Minerals (ALIVE ONCE DAILY WOMENS PO), Take 2 tablets by mouth daily at 12 noon. gummies, Disp: , Rfl:    ondansetron (ZOFRAN) 4 MG tablet, TAKE 1 TABLET BY MOUTH EVERY 8 HOURS AS NEEDED FOR NAUSEA OR VOMITING, Disp: 20 tablet, Rfl: 0   oxyCODONE-acetaminophen (PERCOCET) 7.5-325 MG tablet, Take 1 tablet by mouth every 8 (eight) hours as needed for severe pain., Disp: 90 tablet, Rfl: 0   pantoprazole (PROTONIX) 40 MG tablet, TAKE 1 TABLET BY MOUTH  ONCE DAILY (Patient taking differently: Take 40 mg by mouth daily at 12 noon.), Disp: 90 tablet, Rfl: 1   propranolol (INDERAL) 20 MG tablet, Take 1 tablet (20 mg total) by mouth 2 (two) times daily., Disp: 60 tablet, Rfl: 1   rosuvastatin (CRESTOR) 10 MG tablet, Take 1 tablet (10 mg total) by mouth daily., Disp: 90 tablet, Rfl: 3   Sod Picosulfate-Mag Ox-Cit Acd (CLENPIQ) 10-3.5-12 MG-GM -GM/160ML SOLN, Take 1 kit by mouth as directed. At 5 PM evening before procedure, drink 1 bottle of Clenpiq, hydrate, drink (5) 8 oz of water. Then do the same thing 5 hours prior to your procedure., Disp: 320 mL, Rfl: 0   SUMAtriptan (IMITREX) 50 MG tablet, Take 1 tablet (50 mg total) by mouth every 2 (two) hours as needed for migraine (2 doses max per day). May repeat in 2 hours if headache persists or recurs., Disp: 40 tablet, Rfl: 3   Vitamin D, Ergocalciferol, (DRISDOL) 1.25 MG (50000 UNIT) CAPS capsule, TAKE 1 CAPSULE BY MOUTH ONCE A WEEK, Disp: 4 capsule, Rfl: 0   Medications ordered in this encounter:  No orders of the defined types were placed in this encounter.    *If you need refills on other medications prior to your next appointment, please contact your pharmacy*  Follow-Up: Call back or seek an in-person evaluation if the symptoms worsen or if the condition fails to improve  as anticipated.  Other Instructions Take antibiotic (Azithromycin) as directed.  Increase fluids.  Get plenty of rest. Use Mucinex for congestion. Tessalon per orders. Take a daily probiotic (I recommend Align or Culturelle, but even Activia Yogurt may be beneficial).  A humidifier placed in the bedroom may offer some relief for a dry, scratchy throat of nasal irritation.  Read information below on acute bronchitis. Please call or return to clinic if symptoms are not improving.  Acute Bronchitis Bronchitis is when the airways that extend from the windpipe into the lungs get red, puffy, and painful (inflamed). Bronchitis often  causes thick spit (mucus) to develop. This leads to a cough. A cough is the most common symptom of bronchitis. In acute bronchitis, the condition usually begins suddenly and goes away over time (usually in 2 weeks). Smoking, allergies, and asthma can make bronchitis worse. Repeated episodes of bronchitis may cause more lung problems.  HOME CARE Rest. Drink enough fluids to keep your pee (urine) clear or pale yellow (unless you need to limit fluids as told by your doctor). Only take over-the-counter or prescription medicines as told by your doctor. Avoid smoking and secondhand smoke. These can make bronchitis worse. If you are a smoker, think about using nicotine gum or skin patches. Quitting smoking will help your lungs heal faster. Reduce the chance of getting bronchitis again by: Washing your hands often. Avoiding people with cold symptoms. Trying not to touch your hands to your mouth, nose, or eyes. Follow up with your doctor as told.  GET HELP IF: Your symptoms do not improve after 1 week of treatment. Symptoms include: Cough. Fever. Coughing up thick spit. Body aches. Chest congestion. Chills. Shortness of breath. Sore throat.  GET HELP RIGHT AWAY IF:  You have an increased fever. You have chills. You have severe shortness of breath. You have bloody thick spit (sputum). You throw up (vomit) often. You lose too much body fluid (dehydration). You have a severe headache. You faint.  MAKE SURE YOU:  Understand these instructions. Will watch your condition. Will get help right away if you are not doing well or get worse. Document Released: 08/21/2007 Document Revised: 11/04/2012 Document Reviewed: 08/25/2012 [facility] Patient Information 2015 [place], [place]. This information is not intended to replace advice given to you by your health care provider. Make sure you discuss any questions you have with your health care provider.    If you have been instructed to have an  in-person evaluation today at a local Urgent Care facility, please use the link below. It will take you to a list of all of our available [facility], including address, phone number and hours of operation. Please do not delay care.  [facility]  If you or a family member do not have a primary care provider, use the link below to schedule a visit and establish care. When you choose a [facility] primary care physician or advanced practice provider, you gain a long-term partner in health. Find a Primary Care Provider  Learn more about 's in-office and virtual care options: [facility]

## 2021-05-10 NOTE — Progress Notes (Signed)
Virtual Visit Consent   Kristin Aguirre, you are scheduled for a virtual visit with a [facility] provider today.     Just as with appointments in the office, your consent must be obtained to participate.  Your consent will be active for this visit and any virtual visit you may have with one of our providers in the next 365 days.     If you have a MyChart account, a copy of this consent can be sent to you electronically.  All virtual visits are billed to your insurance company just like a traditional visit in the office.    As this is a virtual visit, video technology does not allow for your provider to perform a traditional examination.  This may limit your provider's ability to fully assess your condition.  If your provider identifies any concerns that need to be evaluated in person or the need to arrange testing (such as labs, EKG, etc.), we will make arrangements to do so.     Although advances in technology are sophisticated, we cannot ensure that it will always work on either your end or our end.  If the connection with a video visit is poor, the visit may have to be switched to a telephone visit.  With either a video or telephone visit, we are not always able to ensure that we have a secure connection.     I need to obtain your verbal consent now.   Are you willing to proceed with your visit today?    Bethene Hankinson Chavis has provided verbal consent on 05/10/2021 for a virtual visit (video or telephone).   Kristin Aguirre, [place]   Date: 05/10/2021 10:42 AM   Virtual Visit via Video Note   I, Kristin Aguirre, connected with  Kristin Aguirre  ([id number], October 12, 1960) on 05/10/21 at 10:45 AM EST by a video-enabled telemedicine application and verified that I am speaking with the correct person using two identifiers.  Location: Patient: Virtual Visit Location Patient: Work Provider: Scientist, research (medical) Provider: Home Office   I discussed the limitations of evaluation and management by  telemedicine and the availability of in person appointments. The patient expressed understanding and agreed to proceed.    History of Present Illness: Kristin Aguirre is a 61 y.o. who identifies as a female who was assigned female at birth, and is being seen today for chest congestion and cough that has been ongoing for a few weeks. Cough has been productive but clear until about 4 days ago. Now dark green and thick. Denies chest pain or SOB. Denies recent travel or sick contact.   HPI: HPI  Problems:  Patient Active Problem List   Diagnosis Date Noted   COVID-19 12/29/2020   Intractable migraine without aura and without status migrainosus 10/20/2020   Colon cancer screening 10/20/2020   Essential hypertension 05/08/2018   Obesity (BMI 35.0-39.9 without comorbidity) 05/06/2018   Primary osteoarthritis of both knees 04/11/2017   Allergic rhinitis 01/31/2015   Airway hyperreactivity 01/31/2015   Atypical chest pain 01/31/2015   Baker's cyst of knee 01/31/2015   Chest pressure 01/31/2015   Chronic headache 01/31/2015   Tobacco use 01/31/2015   Contact with and suspected exposure to infections with predominantly sexual mode of transmission 01/31/2015   Acid reflux 01/31/2015   Postsurgical menopause 01/31/2015   Avitaminosis D 01/31/2015   Low back pain 11/29/2014   Anxiety 08/26/2014   Anxiety, generalized 01/14/2006   Hyperlipidemia 01/14/2006    Allergies:  Allergies  Allergen Reactions   Furosemide Rash   Medications:  Current Outpatient Medications:    azithromycin (ZITHROMAX) 250 MG tablet, Take 2 tablets on day 1, then 1 tablet daily on days 2 through 5, Disp: 6 tablet, Rfl: 0   benzonatate (TESSALON) 100 MG capsule, Take 1 capsule (100 mg total) by mouth 3 (three) times daily as needed for cough., Disp: 30 capsule, Rfl: 0   albuterol (VENTOLIN HFA) 108 (90 Base) MCG/ACT inhaler, Inhale 1-2 puffs into the lungs every 6 (six) hours as needed (cough)., Disp: 6.7 g, Rfl: 0    ALPRAZolam (XANAX) 0.5 MG tablet, TAKE 1 TABLET BY MOUTH TWICE DAILY AS NEEDED ANXIETY, Disp: 60 tablet, Rfl: 0   Ascorbic Acid (VITAMIN C PO), Take 1 tablet by mouth daily at 12 noon., Disp: , Rfl:    aspirin EC 81 MG tablet, Take 81 mg by mouth daily. Swallow whole., Disp: , Rfl:    citalopram (CELEXA) 40 MG tablet, Take 1 tablet (40 mg total) by mouth at bedtime., Disp: 90 tablet, Rfl: 3   diclofenac Sodium (VOLTAREN) 1 % GEL, Apply 4 g topically 4 (four) times daily as needed., Disp: 100 g, Rfl: 3   fluticasone (FLONASE) 50 MCG/ACT nasal spray, Place 2 sprays into both nostrils daily., Disp: 48 g, Rfl: 1   hydrochlorothiazide (HYDRODIURIL) 25 MG tablet, Take 1 tablet (25 mg total) by mouth daily., Disp: 90 tablet, Rfl: 0   lisinopril (ZESTRIL) 10 MG tablet, Take 1 tablet (10 mg total) by mouth daily., Disp: 90 tablet, Rfl: 3   Multiple Vitamins-Minerals (ALIVE ONCE DAILY WOMENS PO), Take 2 tablets by mouth daily at 12 noon. gummies, Disp: , Rfl:    ondansetron (ZOFRAN) 4 MG tablet, TAKE 1 TABLET BY MOUTH EVERY 8 HOURS AS NEEDED FOR NAUSEA OR VOMITING, Disp: 20 tablet, Rfl: 0   oxyCODONE-acetaminophen (PERCOCET) 7.5-325 MG tablet, Take 1 tablet by mouth every 8 (eight) hours as needed for severe pain., Disp: 90 tablet, Rfl: 0   pantoprazole (PROTONIX) 40 MG tablet, TAKE 1 TABLET BY MOUTH ONCE DAILY (Patient taking differently: Take 40 mg by mouth daily at 12 noon.), Disp: 90 tablet, Rfl: 1   propranolol (INDERAL) 20 MG tablet, Take 1 tablet (20 mg total) by mouth 2 (two) times daily., Disp: 60 tablet, Rfl: 1   rosuvastatin (CRESTOR) 10 MG tablet, Take 1 tablet (10 mg total) by mouth daily., Disp: 90 tablet, Rfl: 3   Sod Picosulfate-Mag Ox-Cit Acd (CLENPIQ) 10-3.5-12 MG-GM -GM/160ML SOLN, Take 1 kit by mouth as directed. At 5 PM evening before procedure, drink 1 bottle of Clenpiq, hydrate, drink (5) 8 oz of water. Then do the same thing 5 hours prior to your procedure., Disp: 320 mL, Rfl: 0    SUMAtriptan (IMITREX) 50 MG tablet, Take 1 tablet (50 mg total) by mouth every 2 (two) hours as needed for migraine (2 doses max per day). May repeat in 2 hours if headache persists or recurs., Disp: 40 tablet, Rfl: 3   Vitamin D, Ergocalciferol, (DRISDOL) 1.25 MG (50000 UNIT) CAPS capsule, TAKE 1 CAPSULE BY MOUTH ONCE A WEEK, Disp: 4 capsule, Rfl: 0  Observations/Objective: Patient is well-developed, well-nourished in no acute distress.  Resting comfortably at home.  Head is normocephalic, atraumatic.  No labored breathing. Speech is clear and coherent with logical content.  Patient is alert and oriented at baseline.   Assessment and Plan: 1. Acute bacterial bronchitis - benzonatate (TESSALON) 100 MG capsule; Take 1 capsule (100 mg  total) by mouth 3 (three) times daily as needed for cough.  Dispense: 30 capsule; Refill: 0 - azithromycin (ZITHROMAX) 250 MG tablet; Take 2 tablets on day 1, then 1 tablet daily on days 2 through 5  Dispense: 6 tablet; Refill: 0  Rx Azithromycin.  Increase fluids.  Rest.  Saline nasal spray.  Probiotic.  Mucinex as directed.  Humidifier in bedroom. Tessalon per orders.  Call or return to clinic if symptoms are not improving.   Follow Up Instructions: I discussed the assessment and treatment plan with the patient. The patient was provided an opportunity to ask questions and all were answered. The patient agreed with the plan and demonstrated an understanding of the instructions.  A copy of instructions were sent to the patient via MyChart unless otherwise noted below.   The patient was advised to call back or seek an in-person evaluation if the symptoms worsen or if the condition fails to improve as anticipated.  Time:  I spent 12 minutes with the patient via telehealth technology discussing the above problems/concerns.    Kristin Rio, PA-C

## 2021-05-13 ENCOUNTER — Other Ambulatory Visit: Payer: Self-pay | Admitting: Family Medicine

## 2021-05-13 DIAGNOSIS — F419 Anxiety disorder, unspecified: Secondary | ICD-10-CM

## 2021-05-15 NOTE — Telephone Encounter (Signed)
Requested medication (s) are due for refill today -yes  Requested medication (s) are on the active medication list -yes  Future visit scheduled -no  Last refill: 04/09/21 #60  Notes to clinic: Request RF: non delegated Rx  Requested Prescriptions  Pending Prescriptions Disp Refills   ALPRAZolam (XANAX) 0.5 MG tablet [Pharmacy Med Name: ALPRAZOLAM 0.5 MG TAB] 60 tablet     Sig: TAKE 1 TABLET BY MOUTH TWICE DAILY AS NEEDED FOR ANXIETY     Not Delegated - Psychiatry: Anxiolytics/Hypnotics 2 Failed - 05/13/2021  1:56 PM      Failed - This refill cannot be delegated      Failed - Urine Drug Screen completed in last 360 days      Passed - Patient is not pregnant      Passed - Valid encounter within last 6 months    Recent Outpatient Visits           5 months ago Colon cancer screening   [facility] Tally Joe T, FNP   6 months ago Intractable migraine without aura and without status migrainosus   [facility] Gwyneth Sprout, FNP   1 year ago Annual physical exam   [facility] [place], Clearnce Sorrel, [place]   1 year ago Acute anxiety   [place], Clearnce Sorrel, [place]   3 years ago Essential hypertension   [place], Clearnce Sorrel, [place]       Future Appointments             In 1 month Pieter Partridge, DO [facility]               Requested Prescriptions  Pending Prescriptions Disp Refills   ALPRAZolam (XANAX) 0.5 MG tablet [Pharmacy Med Name: ALPRAZOLAM 0.5 MG TAB] 60 tablet     Sig: TAKE 1 TABLET BY MOUTH TWICE DAILY AS NEEDED FOR ANXIETY     Not Delegated - Psychiatry: Anxiolytics/Hypnotics 2 Failed - 05/13/2021  1:56 PM      Failed - This refill cannot be delegated      Failed - Urine Drug Screen completed in last 360 days      Passed - Patient is not pregnant      Passed - Valid encounter within last 6 months    Recent Outpatient Visits           5  months ago Colon cancer screening   [facility] Tally Joe T, FNP   6 months ago Intractable migraine without aura and without status migrainosus   [facility] Gwyneth Sprout, FNP   1 year ago Annual physical exam   [facility] [place], Clearnce Sorrel, [place]   1 year ago Acute anxiety   [place], Clearnce Sorrel, [place]   3 years ago Essential hypertension   [place], Clearnce Sorrel, [place]       Future Appointments             In 1 month Pieter Partridge, [facility] [place]

## 2021-05-20 ENCOUNTER — Other Ambulatory Visit: Payer: Self-pay | Admitting: Family Medicine

## 2021-05-20 DIAGNOSIS — M170 Bilateral primary osteoarthritis of knee: Secondary | ICD-10-CM

## 2021-05-21 MED ORDER — OXYCODONE-ACETAMINOPHEN 7.5-325 MG PO TABS: 1.0000 | ORAL_TABLET | Freq: Three times a day (TID) | ORAL | 0 refills | Status: DC | PRN

## 2021-06-02 ENCOUNTER — Other Ambulatory Visit: Payer: Self-pay | Admitting: Family Medicine

## 2021-06-02 DIAGNOSIS — F419 Anxiety disorder, unspecified: Secondary | ICD-10-CM

## 2021-06-04 NOTE — Telephone Encounter (Signed)
Requested medication (s) are due for refill today: Yes ? ?Requested medication (s) are on the active medication list: Yes ? ?Last refill:  04/09/21 ? ?Future visit scheduled: Yes ? ?Notes to clinic:  Unable to refill per protocol, cannot delegate. ? ? ? ? ? ?Requested Prescriptions  ?Pending Prescriptions Disp Refills  ? ALPRAZolam (XANAX) 0.5 MG tablet [Pharmacy Med Name: ALPRAZOLAM 0.5 MG TAB] 60 tablet   ?  Sig: TAKE 1 TABLET BY MOUTH TWICE DAILY AS NEEDED FOR ANXIETY  ?  ? Not Delegated - Psychiatry: Anxiolytics/Hypnotics 2 Failed - 06/02/2021 11:03 AM  ?  ?  Failed - This refill cannot be delegated  ?  ?  Failed - Urine Drug Screen completed in last 360 days  ?  ?  Failed - Valid encounter within last 6 months  ?  Recent Outpatient Visits   ? ?      ? 6 months ago Colon cancer screening  ? [facility] Gwyneth Sprout, FNP  ? 7 months ago Intractable migraine without aura and without status migrainosus  ? [facility] Tally Joe T, FNP  ? 1 year ago Annual physical exam  ? [place], [place]  ? 1 year ago Acute anxiety  ? [place], [place]  ? 3 years ago Essential hypertension  ? [place], [place]  ? ?  ?  ?Future Appointments   ? ?        ? In 1 month Pieter Partridge, DO [facility]  ? ?  ? ?  ?  ?  Passed - Patient is not pregnant  ?  ?  ? ? ? ? ?

## 2021-06-10 ENCOUNTER — Other Ambulatory Visit: Payer: Self-pay | Admitting: Family Medicine

## 2021-06-10 DIAGNOSIS — F419 Anxiety disorder, unspecified: Secondary | ICD-10-CM

## 2021-06-18 ENCOUNTER — Ambulatory Visit: Payer: No Typology Code available for payment source | Admitting: Family Medicine

## 2021-06-19 NOTE — Progress Notes (Signed)
?  ? ?[employer] as a Education administrator for Gwyneth Sprout, FNP.,have documented all relevant documentation on the behalf of Gwyneth Sprout, FNP,as directed by  Gwyneth Sprout, FNP while in the presence of Gwyneth Sprout, FNP.  ? ?Established patient visit ? ? ?Patient: Kristin Aguirre   DOB: 18-Feb-1961   61 y.o. Female  MRN: [id number] ?Visit Date: 06/20/2021 ? ?Today's healthcare provider: Gwyneth Sprout, FNP  ? ?Introduced to Designer, jewellery role and practice setting.  All questions answered.  Discussed provider/patient relationship and expectations. ? ? ?Chief Complaint  ?Patient presents with  ? Follow-up  ? Hypertension  ? Anxiety  ? Obesity  ?  Patient would like to discuss options to help with weight loss, patient states that she has been working on her diet and states that she can not exercise do to arthritis in knees  ? Knee Pain  ? ?Subjective  ?  ?HPI ?HPI   ? ? Obesity   ? Additional comments: Patient would like to discuss options to help with weight loss, patient states that she has been working on her diet and states that she can not exercise do to arthritis in knees ? ?  ?  ?Last edited by Minette Headland, CMA on 06/20/2021  3:54 PM.  ?  ?  ?Follow up for Osteoarthritis of both knees ? ?The patient was last seen for this 6 months ago. ?Changes made at last visit include pain referral placed, . ? ?She reports excellent compliance with treatment. ?She feels that condition is Unchanged. Patient states that pain clinic never reached out to her to make appointment.  ?She is not having side effects.  ? ?-----------------------------------------------------------------------------------------  ?Hypertension, follow-up ? ?BP Readings from Last 3 Encounters:  ?06/20/21 137/66  ?01/12/21 118/82  ?12/01/20 (!) 154/85  ? Wt Readings from Last 3 Encounters:  ?06/20/21 248 lb (112.5 kg)  ?12/01/20 254 lb 4.8 oz (115.3 kg)  ?10/20/20 256 lb (116.1 kg)  ?  ? ?She was last seen for hypertension 6 months ago.  ?BP  at that visit was 154/85. Management since that visit includes  ? Wishes to hold increase of ACEi today given start of BB for migraine prevention ?Plans to purchase BP monitor  ?. ? ?She reports excellent compliance with treatment. ?She is not having side effects. ?She is following a Regular diet. ?She is not exercising. ?She does smoke. ? ?Use of agents associated with hypertension: NSAIDS.  ? ?Outside blood pressures are not checked. ?Symptoms: ?No chest pain No chest pressure  ?No palpitations No syncope  ?Yes dyspnea No orthopnea  ?No paroxysmal nocturnal dyspnea No lower extremity edema  ? ?Pertinent labs ?Lab Results  ?Component Value Date  ? CHOL 235 (H) 10/20/2020  ? HDL 49 10/20/2020  ? LDLCALC 143 (H) 10/20/2020  ? TRIG 239 (H) 10/20/2020  ? CHOLHDL 4.8 (H) 10/20/2020  ? Lab Results  ?Component Value Date  ? NA 140 10/20/2020  ? K 4.9 10/20/2020  ? CREATININE 0.66 10/20/2020  ? EGFR 100 10/20/2020  ? GLUCOSE 108 (H) 10/20/2020  ? TSH 0.693 10/20/2020  ?  ? ?The 10-year ASCVD risk score (Arnett DK, et al., 2019) is: 13.5% ? ?---------------------------------------------------------------------------------------------------  ?Anxiety, Follow-up ? ?She was last seen for anxiety 7 months ago. ?Changes made at last visit include restarted celexa 22m, wean off Xanax. ?  ?She reports excellent compliance with treatment. ?She reports excellent tolerance of treatment. ?She is not having side effects.  ? ?  She feels her anxiety is moderate and Unchanged since last visit. ? ?Symptoms: ?No chest pain No difficulty concentrating  ?No dizziness Yes fatigue  ?No feelings of losing control No insomnia  ?No irritable No palpitations  ?No panic attacks No racing thoughts  ?No shortness of breath Yes sweating  ?Yes tremors/shakes   ? ?GAD-7 Results ? ?  08/28/2016  ? 10:08 AM  ?GAD-7 Generalized Anxiety Disorder Screening Tool  ?1. Feeling Nervous, Anxious, or on Edge 1  ?2. Not Being Able to Stop or Control Worrying 0  ?3.  Worrying Too Much About Different Things 1  ?4. Trouble Relaxing 0  ?5. Being So Restless it's Hard To Sit Still 0  ?6. Becoming Easily Annoyed or Irritable 0  ?7. Feeling Afraid As If Something Awful Might Happen 0  ?Total GAD-7 Score 2  ?Difficulty At Work, Home, or Getting  Along With Others? Not difficult at all  ? ? ?PHQ-9 Scores ? ?  06/20/2021  ?  4:00 PM 11/26/2019  ?  2:40 PM 08/28/2016  ? 10:06 AM  ?PHQ9 SCORE ONLY  ?PHQ-9 Total Score _0 ? ? ?---------------------------------------------------------------------------------------------------  ? ?Medications: ?Outpatient Medications Prior to Visit  ?Medication Sig  ? albuterol (VENTOLIN HFA) 108 (90 Base) MCG/ACT inhaler Inhale 1-2 puffs into the lungs every 6 (six) hours as needed (cough).  ? ALPRAZolam (XANAX) 0.5 MG tablet Take 1 tablet (0.5 mg total) by mouth 2 (two) times daily as needed. Due for 6 month controlled substance office visit. Please call and schedule an appointment.  ? Ascorbic Acid (VITAMIN C PO) Take 1 tablet by mouth daily at 12 noon.  ? aspirin EC 81 MG tablet Take 81 mg by mouth daily. Swallow whole.  ? benzonatate (TESSALON) 100 MG capsule Take 1 capsule (100 mg total) by mouth 3 (three) times daily as needed for cough.  ? citalopram (CELEXA) 40 MG tablet Take 1 tablet (40 mg total) by mouth at bedtime.  ? diclofenac Sodium (VOLTAREN) 1 % GEL Apply 4 g topically 4 (four) times daily as needed.  ? fluticasone (FLONASE) 50 MCG/ACT nasal spray Place 2 sprays into both nostrils daily.  ? lisinopril (ZESTRIL) 10 MG tablet Take 1 tablet (10 mg total) by mouth daily.  ? Multiple Vitamins-Minerals (ALIVE ONCE DAILY WOMENS PO) Take 2 tablets by mouth daily at 12 noon. gummies  ? ondansetron (ZOFRAN) 4 MG tablet TAKE 1 TABLET BY MOUTH EVERY 8 HOURS AS NEEDED FOR NAUSEA OR VOMITING  ? pantoprazole (PROTONIX) 40 MG tablet TAKE 1 TABLET BY MOUTH ONCE DAILY (Patient taking differently: Take 40 mg by mouth daily at 12 noon.)  ? rosuvastatin  (CRESTOR) 10 MG tablet Take 1 tablet (10 mg total) by mouth daily.  ? Sod Picosulfate-Mag Ox-Cit Acd (CLENPIQ) 10-3.5-12 MG-GM -GM/160ML SOLN Take 1 kit by mouth as directed. At 5 PM evening before procedure, drink 1 bottle of Clenpiq, hydrate, drink (5) 8 oz of water. Then do the same thing 5 hours prior to your procedure.  ? [DISCONTINUED] hydrochlorothiazide (HYDRODIURIL) 25 MG tablet Take 1 tablet (25 mg total) by mouth daily.  ? [DISCONTINUED] oxyCODONE-acetaminophen (PERCOCET) 7.5-325 MG tablet Take 1 tablet by mouth every 8 (eight) hours as needed for severe pain. Due for office visit for additional refills.  ? [DISCONTINUED] propranolol (INDERAL) 20 MG tablet Take 1 tablet (20 mg total) by mouth 2 (two) times daily.  ? [DISCONTINUED] SUMAtriptan (IMITREX) 50 MG tablet Take 1 tablet (50 mg total) by mouth  every 2 (two) hours as needed for migraine (2 doses max per day). May repeat in 2 hours if headache persists or recurs.  ? [DISCONTINUED] Vitamin D, Ergocalciferol, (DRISDOL) 1.25 MG (50000 UNIT) CAPS capsule TAKE 1 CAPSULE BY MOUTH ONCE A WEEK  ? ?No facility-administered medications prior to visit.  ? ? ?Review of Systems ? ? ?  Objective  ?  ?BP 137/66   Pulse 91   Temp (!) 96.8 ?F (36 ?C) (Temporal)   Resp 16   Wt 248 lb (112.5 kg)   LMP 03/18/2001 (Approximate)   BMI 36.62 kg/m?  ? ? ?Physical Exam ?Vitals and nursing note reviewed.  ?Constitutional:   ?   General: She is not in acute distress. ?   Appearance: Normal appearance. She is obese. She is not ill-appearing, toxic-appearing or diaphoretic.  ?HENT:  ?   Head: Normocephalic and atraumatic.  ?Cardiovascular:  ?   Rate and Rhythm: Normal rate and regular rhythm.  ?   Pulses: Normal pulses.  ?   Heart sounds: Normal heart sounds. No murmur heard. ?  No friction rub. No gallop.  ?Pulmonary:  ?   Effort: Pulmonary effort is normal. No respiratory distress.  ?   Breath sounds: Normal breath sounds. No stridor. No wheezing, rhonchi or rales.   ?Chest:  ?   Chest wall: No tenderness.  ?Abdominal:  ?   General: Bowel sounds are normal.  ?   Palpations: Abdomen is soft.  ?Musculoskeletal:     ?   General: No swelling, tenderness, deformity or signs of injury.

## 2021-06-20 ENCOUNTER — Encounter: Payer: Self-pay | Admitting: Family Medicine

## 2021-06-20 ENCOUNTER — Ambulatory Visit (INDEPENDENT_AMBULATORY_CARE_PROVIDER_SITE_OTHER): Payer: No Typology Code available for payment source | Admitting: Family Medicine

## 2021-06-20 VITALS — BP 137/66 | HR 91 | Temp 96.8°F | Resp 16 | Wt 248.0 lb

## 2021-06-20 DIAGNOSIS — R0609 Other forms of dyspnea: Secondary | ICD-10-CM | POA: Diagnosis not present

## 2021-06-20 DIAGNOSIS — F1190 Opioid use, unspecified, uncomplicated: Secondary | ICD-10-CM | POA: Diagnosis not present

## 2021-06-20 DIAGNOSIS — M170 Bilateral primary osteoarthritis of knee: Secondary | ICD-10-CM

## 2021-06-20 DIAGNOSIS — I10 Essential (primary) hypertension: Secondary | ICD-10-CM | POA: Diagnosis not present

## 2021-06-20 DIAGNOSIS — Z1211 Encounter for screening for malignant neoplasm of colon: Secondary | ICD-10-CM | POA: Insufficient documentation

## 2021-06-20 DIAGNOSIS — M7989 Other specified soft tissue disorders: Secondary | ICD-10-CM | POA: Insufficient documentation

## 2021-06-20 DIAGNOSIS — G43019 Migraine without aura, intractable, without status migrainosus: Secondary | ICD-10-CM

## 2021-06-20 DIAGNOSIS — Z79899 Other long term (current) drug therapy: Secondary | ICD-10-CM | POA: Diagnosis not present

## 2021-06-20 MED ORDER — OXYCODONE-ACETAMINOPHEN 7.5-325 MG PO TABS: 1.0000 | ORAL_TABLET | Freq: Three times a day (TID) | ORAL | 0 refills | Status: AC | PRN

## 2021-06-20 MED ORDER — PROPRANOLOL HCL 20 MG PO TABS: 20.0000 mg | ORAL_TABLET | Freq: Two times a day (BID) | ORAL | 1 refills | Status: DC

## 2021-06-20 MED ORDER — SUMATRIPTAN SUCCINATE 50 MG PO TABS: 50.0000 mg | ORAL_TABLET | ORAL | 3 refills | Status: DC | PRN

## 2021-06-20 MED ORDER — HYDROCHLOROTHIAZIDE 25 MG PO TABS: 25.0000 mg | ORAL_TABLET | Freq: Every day | ORAL | 1 refills | Status: DC

## 2021-06-20 MED ORDER — VITAMIN D (ERGOCALCIFEROL) 1.25 MG (50000 UNIT) PO CAPS: 50000.0000 [IU] | ORAL_CAPSULE | ORAL | 1 refills | Status: DC

## 2021-06-20 NOTE — Assessment & Plan Note (Signed)
Acute, with stair climbing ?Pt request to be seen by cards ?Hx of family cardiac concerns ?Pt hx of HTN and tobacco use ?

## 2021-06-20 NOTE — Assessment & Plan Note (Signed)
UDS today and control substance contract for xanax good for 1 year ?

## 2021-06-20 NOTE — Assessment & Plan Note (Signed)
Chronic, stable ?Request for refills ?Will refer to neurology if symptoms worsen or don't improve with use of medication ?

## 2021-06-20 NOTE — Assessment & Plan Note (Signed)
Chronic, stable ?Denies CP ?Denies SOB/ DOE ?Denies low blood pressure/hypotension ?Denies vision changes ?No LE Edema noted on exam ?Continue medication, Lisinopril 10 mg and Propranol 20 mg BID ?Denies side effects ?RTC in 6 months ?Seek emergent care if you develop chest pain or chest pressure ? ?

## 2021-06-20 NOTE — Assessment & Plan Note (Signed)
Chronic, associated with HTN ?Take HCTZ 25 mg to assist ?Recommend use of DASH diet ?

## 2021-06-20 NOTE — Assessment & Plan Note (Signed)
Chronic, worsening ?Continue medication to assist ?Has seen ortho with plan for ortho joint replacement later this Fall ?

## 2021-06-20 NOTE — Assessment & Plan Note (Signed)
Denies changes in stool color, consistency or character ?Due for screening mammogram ?

## 2021-06-20 NOTE — Assessment & Plan Note (Signed)
Request for UDS today and opioid contract good for 1 year ?

## 2021-06-21 ENCOUNTER — Other Ambulatory Visit: Payer: Self-pay

## 2021-06-21 DIAGNOSIS — Z1211 Encounter for screening for malignant neoplasm of colon: Secondary | ICD-10-CM

## 2021-06-21 MED ORDER — GOLYTELY 236 G PO SOLR: 4000.0000 mL | Freq: Once | ORAL | 0 refills | Status: AC

## 2021-06-21 NOTE — Progress Notes (Signed)
Gastroenterology Pre-Procedure Review ? ?Request Date: May 5th 2023 ?Requesting Physician: Dr. Vicente Males ? ?PATIENT REVIEW QUESTIONS: The patient responded to the following health history questions as indicated:   ? ?1. Are you having any GI issues? no ?2. Do you have a personal history of Polyps? no ?3. Do you have a family history of Colon Cancer or Polyps? no ?4. Diabetes Mellitus? no ?5. Joint replacements in the past 12 months?no ?6. Major health problems in the past 3 months?no ?7. Any artificial heart valves, MVP, or defibrillator?no ?   ?MEDICATIONS & ALLERGIES:    ?Patient reports the following regarding taking any anticoagulation/antiplatelet therapy:   ?Plavix, Coumadin, Eliquis, Xarelto, Lovenox, Pradaxa, Brilinta, or Effient? no ?Aspirin? yes ($RemoveBefore'81mg'[id number]$  daily) ? ?Patient confirms/reports the following medications:  ?Current Outpatient Medications  ?Medication Sig Dispense Refill  ? albuterol (VENTOLIN HFA) 108 (90 Base) MCG/ACT inhaler Inhale 1-2 puffs into the lungs every 6 (six) hours as needed (cough). 6.7 g 0  ? ALPRAZolam (XANAX) 0.5 MG tablet Take 1 tablet (0.5 mg total) by mouth 2 (two) times daily as needed. Due for 6 month controlled substance office visit. Please call and schedule an appointment. 30 tablet 0  ? Ascorbic Acid (VITAMIN C PO) Take 1 tablet by mouth daily at 12 noon.    ? aspirin EC 81 MG tablet Take 81 mg by mouth daily. Swallow whole.    ? benzonatate (TESSALON) 100 MG capsule Take 1 capsule (100 mg total) by mouth 3 (three) times daily as needed for cough. 30 capsule 0  ? citalopram (CELEXA) 40 MG tablet Take 1 tablet (40 mg total) by mouth at bedtime. 90 tablet 3  ? diclofenac Sodium (VOLTAREN) 1 % GEL Apply 4 g topically 4 (four) times daily as needed. 100 g 3  ? fluticasone (FLONASE) 50 MCG/ACT nasal spray Place 2 sprays into both nostrils daily. 48 g 1  ? hydrochlorothiazide (HYDRODIURIL) 25 MG tablet Take 1 tablet (25 mg total) by mouth daily. 90 tablet 1  ? lisinopril (ZESTRIL) 10  MG tablet Take 1 tablet (10 mg total) by mouth daily. 90 tablet 3  ? Multiple Vitamins-Minerals (ALIVE ONCE DAILY WOMENS PO) Take 2 tablets by mouth daily at 12 noon. gummies    ? ondansetron (ZOFRAN) 4 MG tablet TAKE 1 TABLET BY MOUTH EVERY 8 HOURS AS NEEDED FOR NAUSEA OR VOMITING 20 tablet 0  ? oxyCODONE-acetaminophen (PERCOCET) 7.5-325 MG tablet Take 1 tablet by mouth every 8 (eight) hours as needed for severe pain. 90 tablet 0  ? pantoprazole (PROTONIX) 40 MG tablet TAKE 1 TABLET BY MOUTH ONCE DAILY (Patient taking differently: Take 40 mg by mouth daily at 12 noon.) 90 tablet 1  ? propranolol (INDERAL) 20 MG tablet Take 1 tablet (20 mg total) by mouth 2 (two) times daily. 180 tablet 1  ? rosuvastatin (CRESTOR) 10 MG tablet Take 1 tablet (10 mg total) by mouth daily. 90 tablet 3  ? Sod Picosulfate-Mag Ox-Cit Acd (CLENPIQ) 10-3.5-12 MG-GM -GM/160ML SOLN Take 1 kit by mouth as directed. At 5 PM evening before procedure, drink 1 bottle of Clenpiq, hydrate, drink (5) 8 oz of water. Then do the same thing 5 hours prior to your procedure. 320 mL 0  ? SUMAtriptan (IMITREX) 50 MG tablet Take 1 tablet (50 mg total) by mouth every 2 (two) hours as needed for migraine (2 doses max per day). May repeat in 2 hours if headache persists or recurs. 40 tablet 3  ? Vitamin D, Ergocalciferol, (DRISDOL) 1.25  MG (50000 UNIT) CAPS capsule Take 1 capsule (50,000 Units total) by mouth once a week. 13 capsule 1  ? ?No current facility-administered medications for this visit.  ? ? ?Patient confirms/reports the following allergies:  ?Allergies  ?Allergen Reactions  ? Furosemide Rash  ? ? ?No orders of the defined types were placed in this encounter. ? ? ?AUTHORIZATION INFORMATION ?Primary Insurance: ?1D#: ?Group #: ? ?Secondary Insurance: ?1D#: ?Group #: ? ?SCHEDULE INFORMATION: ?Date: 07/20/21 ?Time: ?Location: [facility] ?

## 2021-07-09 NOTE — Progress Notes (Deleted)
NEUROLOGY CONSULTATION NOTE  Kristin Aguirre MRN: [id number] DOB: 08-Mar-1961  Referring provider: Tally Joe, FNP Primary care provider: Tally Joe, FNP  Reason for consult:  migraine  Assessment/Plan:   ***   Subjective:  Kristin Aguirre is a 61 year old female with HTN, osteoarthritis of knees, anxiety who presents for migraines.  History supplemented by referring provider's note.  Onset:  *** Location:  *** Quality:  *** Intensity:  ***.  *** denies new headache, thunderclap headache or severe headache that wakes *** from sleep. Aura:  *** Prodrome:  *** Postdrome:  *** Associated symptoms:  ***.  *** denies associated unilateral numbness or weakness. Duration:  *** Frequency:  *** Frequency of abortive medication: *** Triggers:  *** Relieving factors:  *** Activity:  ***  Current NSAIDS/analgesics:  ASA 43m daily, Percocet (for arthritis) Current triptans:  sumatriptan 558mCurrent ergotamine:  none Current anti-emetic:  Zofran 46m91murrent muscle relaxants:  none Current Antihypertensive medications:  propranolol 71m33mD, lisinopril 10mg65mTZ 25mg 86ment Antidepressant medications:  citalopram 40mg C57mnt Anticonvulsant medications:  none Current anti-CGRP:  none Current Vitamins/Herbal/Supplements:  MVI, C, D Current Antihistamines/Decongestants:  Flonase Other therapy:  *** Hormone/birth control:  none Other medications:  alprazolam  Past NSAIDS/analgesics:  ibuprofen, naproxen, Fioricet, etodolac, acetaminophen, meloxicam Past abortive triptans:  *** Past abortive ergotamine:  none Past muscle relaxants:  none Past anti-emetic:  *** Past antihypertensive medications:  amlodipine Past antidepressant medications:  *** Past anticonvulsant medications:  *** Past anti-CGRP:  none Past vitamins/Herbal/Supplements:  none Past antihistamines/decongestants:  none Other past therapies:  ***  Caffeine:  *** Alcohol:  *** Smoker:  *** Diet:   *** Exercise:  *** Depression:  ***; Anxiety:  Yes Other pain:  osteoarthritis of both knees.  Takes Percocet Sleep hygiene:  *** Family history of headache:  ***      PAST MEDICAL HISTORY: Past Medical History:  Diagnosis Date   Allergy    Anxiety    Diffuse cystic mastopathy    LEFT SIDE   GERD (gastroesophageal reflux disease)    High cholesterol    PONV (postoperative nausea and vomiting)     PAST SURGICAL HISTORY: Past Surgical History:  Procedure Laterality Date   ABDOMINAL HYSTERECTOMY  2003   BREAST BIOPSY Left 2011, 2015   neg   BREAST BIOPSY Left 07/17/2009   Vacuum biopsy, 4:00 position: 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   cervical spine repair      CESAREAN SECTION     TONSILLECTOMY      MEDICATIONS: Current Outpatient Medications on File Prior to Visit  Medication Sig Dispense Refill   albuterol (VENTOLIN HFA) 108 (90 Base) MCG/ACT inhaler Inhale 1-2 puffs into the lungs every 6 (six) hours as needed (cough). 6.7 g 0   ALPRAZolam (XANAX) 0.5 MG tablet Take 1 tablet (0.5 mg total) by mouth 2 (two) times daily as needed. Due for 6 month controlled substance office visit. Please call and schedule an appointment. 30 tablet 0   Ascorbic Acid (VITAMIN C PO) Take 1 tablet by mouth daily at 12 noon.     aspirin EC 81 MG tablet Take 81 mg by mouth daily. Swallow whole.     benzonatate (TESSALON) 100 MG capsule Take 1 capsule (100 mg total) by mouth 3 (three) times daily as needed for cough. 30 capsule 0   citalopram (CELEXA) 40 MG tablet Take 1 tablet (40 mg total) by mouth at bedtime.  90 tablet 3   diclofenac Sodium (VOLTAREN) 1 % GEL Apply 4 g topically 4 (four) times daily as needed. 100 g 3   fluticasone (FLONASE) 50 MCG/ACT nasal spray Place 2 sprays into both nostrils daily. 48 g 1   hydrochlorothiazide (HYDRODIURIL) 25 MG tablet Take 1 tablet (25 mg total) by mouth  daily. 90 tablet 1   lisinopril (ZESTRIL) 10 MG tablet Take 1 tablet (10 mg total) by mouth daily. 90 tablet 3   Multiple Vitamins-Minerals (ALIVE ONCE DAILY WOMENS PO) Take 2 tablets by mouth daily at 12 noon. gummies     ondansetron (ZOFRAN) 4 MG tablet TAKE 1 TABLET BY MOUTH EVERY 8 HOURS AS NEEDED FOR NAUSEA OR VOMITING 20 tablet 0   oxyCODONE-acetaminophen (PERCOCET) 7.5-325 MG tablet Take 1 tablet by mouth every 8 (eight) hours as needed for severe pain. 90 tablet 0   pantoprazole (PROTONIX) 40 MG tablet TAKE 1 TABLET BY MOUTH ONCE DAILY (Patient taking differently: Take 40 mg by mouth daily at 12 noon.) 90 tablet 1   propranolol (INDERAL) 20 MG tablet Take 1 tablet (20 mg total) by mouth 2 (two) times daily. 180 tablet 1   rosuvastatin (CRESTOR) 10 MG tablet Take 1 tablet (10 mg total) by mouth daily. 90 tablet 3   Sod Picosulfate-Mag Ox-Cit Acd (CLENPIQ) 10-3.5-12 MG-GM -GM/160ML SOLN Take 1 kit by mouth as directed. At 5 PM evening before procedure, drink 1 bottle of Clenpiq, hydrate, drink (5) 8 oz of water. Then do the same thing 5 hours prior to your procedure. 320 mL 0   SUMAtriptan (IMITREX) 50 MG tablet Take 1 tablet (50 mg total) by mouth every 2 (two) hours as needed for migraine (2 doses max per day). May repeat in 2 hours if headache persists or recurs. 40 tablet 3   Vitamin D, Ergocalciferol, (DRISDOL) 1.25 MG (50000 UNIT) CAPS capsule Take 1 capsule (50,000 Units total) by mouth once a week. 13 capsule 1   No current facility-administered medications on file prior to visit.    ALLERGIES: Allergies  Allergen Reactions   Furosemide Rash    FAMILY HISTORY: Family History  Problem Relation Age of Onset   Heart block Mother    Cancer Mother        on leg   Diabetes Mother    Heart disease Mother    Diabetes Father    Heart disease Father    CVA Father    Asthma Sister    Anemia Sister    Seizures Brother    Breast cancer Maternal Grandmother 55    Objective:   *** General: No acute distress.  Patient appears well-groomed.   Head:  Normocephalic/atraumatic Eyes:  fundi examined but not visualized Neck: supple, no paraspinal tenderness, full range of motion Back: No paraspinal tenderness Heart: regular rate and rhythm Lungs: Clear to auscultation bilaterally. Vascular: No carotid bruits. Neurological Exam: Mental status: alert and oriented to person, place, and time, recent and remote memory intact, fund of knowledge intact, attention and concentration intact, speech fluent and not dysarthric, language intact. Cranial nerves: CN I: not tested CN II: pupils equal, round and reactive to light, visual fields intact CN III, IV, VI:  full range of motion, no nystagmus, no ptosis CN V: facial sensation intact. CN VII: upper and lower face symmetric CN VIII: hearing intact CN IX, X: gag intact, uvula midline CN XI: sternocleidomastoid and trapezius muscles intact CN XII: tongue midline Bulk & Tone: normal, no fasciculations. Motor:  muscle strength 5/5 throughout Sensation:  Pinprick, temperature and vibratory sensation intact. Deep Tendon Reflexes:  2+ throughout,  toes downgoing.   Finger to nose testing:  Without dysmetria.   Heel to shin:  Without dysmetria.   Gait:  Normal station and stride.  Romberg negative.    Thank you for allowing me to take part in the care of this patient.  Metta Clines, DO  CC: ***

## 2021-07-10 ENCOUNTER — Encounter: Payer: Self-pay | Admitting: Neurology

## 2021-07-10 ENCOUNTER — Ambulatory Visit: Payer: No Typology Code available for payment source | Admitting: Neurology

## 2021-07-10 DIAGNOSIS — Z029 Encounter for administrative examinations, unspecified: Secondary | ICD-10-CM

## 2021-07-18 ENCOUNTER — Other Ambulatory Visit: Payer: Self-pay

## 2021-07-18 DIAGNOSIS — Z1211 Encounter for screening for malignant neoplasm of colon: Secondary | ICD-10-CM

## 2021-07-20 ENCOUNTER — Other Ambulatory Visit: Payer: Self-pay | Admitting: Family Medicine

## 2021-07-20 ENCOUNTER — Ambulatory Visit
Admission: RE | Admit: 2021-07-20 | Payer: No Typology Code available for payment source | Source: Ambulatory Visit | Admitting: Gastroenterology

## 2021-07-20 ENCOUNTER — Encounter: Admission: RE | Payer: Self-pay | Source: Ambulatory Visit

## 2021-07-20 DIAGNOSIS — M170 Bilateral primary osteoarthritis of knee: Secondary | ICD-10-CM

## 2021-07-20 SURGERY — COLONOSCOPY WITH PROPOFOL
Anesthesia: General

## 2021-08-02 ENCOUNTER — Ambulatory Visit: Payer: No Typology Code available for payment source | Admitting: Cardiology

## 2021-08-03 ENCOUNTER — Encounter: Payer: Self-pay | Admitting: Cardiology

## 2021-08-13 ENCOUNTER — Encounter: Payer: Self-pay | Admitting: Emergency Medicine

## 2021-08-13 ENCOUNTER — Emergency Department: Payer: No Typology Code available for payment source

## 2021-08-13 ENCOUNTER — Other Ambulatory Visit: Payer: Self-pay

## 2021-08-13 ENCOUNTER — Observation Stay
Admission: EM | Admit: 2021-08-13 | Discharge: 2021-08-15 | Disposition: A | Discharge status: Home / Self Care | Payer: No Typology Code available for payment source | Attending: Internal Medicine | Admitting: Internal Medicine

## 2021-08-13 DIAGNOSIS — N12 Tubulo-interstitial nephritis, not specified as acute or chronic: Principal | ICD-10-CM | POA: Insufficient documentation

## 2021-08-13 DIAGNOSIS — A419 Sepsis, unspecified organism: Secondary | ICD-10-CM

## 2021-08-13 DIAGNOSIS — F17210 Nicotine dependence, cigarettes, uncomplicated: Secondary | ICD-10-CM | POA: Diagnosis not present

## 2021-08-13 DIAGNOSIS — R739 Hyperglycemia, unspecified: Secondary | ICD-10-CM | POA: Diagnosis not present

## 2021-08-13 DIAGNOSIS — N10 Acute tubulo-interstitial nephritis: Secondary | ICD-10-CM | POA: Diagnosis present

## 2021-08-13 DIAGNOSIS — Z7982 Long term (current) use of aspirin: Secondary | ICD-10-CM | POA: Diagnosis not present

## 2021-08-13 DIAGNOSIS — Z79899 Other long term (current) drug therapy: Secondary | ICD-10-CM | POA: Diagnosis not present

## 2021-08-13 DIAGNOSIS — I10 Essential (primary) hypertension: Secondary | ICD-10-CM | POA: Diagnosis not present

## 2021-08-13 DIAGNOSIS — E876 Hypokalemia: Secondary | ICD-10-CM | POA: Diagnosis not present

## 2021-08-13 DIAGNOSIS — R109 Unspecified abdominal pain: Secondary | ICD-10-CM | POA: Diagnosis present

## 2021-08-13 HISTORY — DX: Acute pyelonephritis: N10

## 2021-08-13 HISTORY — DX: Sepsis, unspecified organism: A41.9

## 2021-08-13 LAB — URINALYSIS, ROUTINE W REFLEX MICROSCOPIC
Bilirubin Urine: NEGATIVE
Glucose, UA: NEGATIVE mg/dL
Ketones, ur: 20 mg/dL — AB
Nitrite: POSITIVE — AB
Protein, ur: 100 mg/dL — AB
Specific Gravity, Urine: 1.013 (ref 1.005–1.030)
pH: 6 (ref 5.0–8.0)

## 2021-08-13 LAB — COMPREHENSIVE METABOLIC PANEL
ALT: 17 U/L (ref 0–44)
AST: 19 U/L (ref 15–41)
Albumin: 4 g/dL (ref 3.5–5.0)
Alkaline Phosphatase: 98 U/L (ref 38–126)
Anion gap: 12 (ref 5–15)
BUN: 13 mg/dL (ref 8–23)
CO2: 26 mmol/L (ref 22–32)
Calcium: 9.2 mg/dL (ref 8.9–10.3)
Chloride: 99 mmol/L (ref 98–111)
Creatinine, Ser: 0.61 mg/dL (ref 0.44–1.00)
GFR, Estimated: >60 mL/min (ref >60)
Glucose, Bld: 157 mg/dL — ABNORMAL HIGH (ref 70–99)
Potassium: 3.4 mmol/L — ABNORMAL LOW (ref 3.5–5.1)
Sodium: 137 mmol/L (ref 135–145)
Total Bilirubin: 0.9 mg/dL (ref 0.3–1.2)
Total Protein: 8.3 g/dL — ABNORMAL HIGH (ref 6.5–8.1)

## 2021-08-13 LAB — CBC WITH DIFFERENTIAL/PLATELET
Abs Immature Granulocytes: 0.08 10*3/uL — ABNORMAL HIGH (ref 0.00–0.07)
Basophils Absolute: 0.1 10*3/uL (ref 0.0–0.1)
Basophils Relative: 1 %
Eosinophils Absolute: 0.1 10*3/uL (ref 0.0–0.5)
Eosinophils Relative: 0 %
HCT: 40.4 % (ref 36.0–46.0)
Hemoglobin: 12.6 g/dL (ref 12.0–15.0)
Immature Granulocytes: 1 %
Lymphocytes Relative: 10 %
Lymphs Abs: 1.6 10*3/uL (ref 0.7–4.0)
MCH: 28.6 pg (ref 26.0–34.0)
MCHC: 31.2 g/dL (ref 30.0–36.0)
MCV: 91.6 fL (ref 80.0–100.0)
Monocytes Absolute: 0.4 10*3/uL (ref 0.1–1.0)
Monocytes Relative: 3 %
Neutro Abs: 13.8 10*3/uL — ABNORMAL HIGH (ref 1.7–7.7)
Neutrophils Relative %: 85 %
Platelets: 331 10*3/uL (ref 150–400)
RBC: 4.41 MIL/uL (ref 3.87–5.11)
RDW: 11.9 % (ref 11.5–15.5)
WBC: 16 10*3/uL — ABNORMAL HIGH (ref 4.0–10.5)
nRBC: 0 % (ref 0.0–0.2)

## 2021-08-13 LAB — MAGNESIUM: Magnesium: 2.2 mg/dL (ref 1.7–2.4)

## 2021-08-13 LAB — LACTIC ACID, PLASMA: Lactic Acid, Venous: 1.4 mmol/L (ref 0.5–1.9)

## 2021-08-13 MED ORDER — ACETAMINOPHEN 325 MG PO TABS
650.0000 mg | ORAL_TABLET | Freq: Four times a day (QID) | ORAL | Status: DC | PRN
  Administered 2021-08-13: 650 mg via ORAL
  Filled 2021-08-13: qty 2

## 2021-08-13 MED ORDER — PNEUMOCOCCAL 20-VAL CONJ VACC 0.5 ML IM SUSY
0.5000 mL | PREFILLED_SYRINGE | INTRAMUSCULAR | Status: DC | PRN
  Filled 2021-08-13: qty 0.5

## 2021-08-13 MED ORDER — ROSUVASTATIN CALCIUM 10 MG PO TABS
10.0000 mg | ORAL_TABLET | Freq: Every day | ORAL | Status: DC
  Administered 2021-08-14 – 2021-08-15 (×2): 10 mg via ORAL
  Filled 2021-08-13 (×2): qty 1

## 2021-08-13 MED ORDER — MORPHINE SULFATE (PF) 4 MG/ML IV SOLN
4.0000 mg | Freq: Once | INTRAVENOUS | Status: AC
  Administered 2021-08-13: 4 mg via INTRAVENOUS
  Filled 2021-08-13: qty 1

## 2021-08-13 MED ORDER — HYDROMORPHONE HCL 1 MG/ML IJ SOLN
1.0000 mg | INTRAMUSCULAR | Status: DC | PRN
  Administered 2021-08-14 (×2): 1 mg via INTRAVENOUS
  Filled 2021-08-13 (×2): qty 1

## 2021-08-13 MED ORDER — ONDANSETRON HCL 4 MG PO TABS: 4.0000 mg | ORAL_TABLET | Freq: Four times a day (QID) | ORAL | Status: DC | PRN

## 2021-08-13 MED ORDER — ASPIRIN 81 MG PO TBEC
81.0000 mg | DELAYED_RELEASE_TABLET | Freq: Every day | ORAL | Status: DC
  Administered 2021-08-14 – 2021-08-15 (×2): 81 mg via ORAL
  Filled 2021-08-13 (×2): qty 1

## 2021-08-13 MED ORDER — SODIUM CHLORIDE 0.9 % IV SOLN
1.0000 g | INTRAVENOUS | Status: DC
  Administered 2021-08-14 – 2021-08-15 (×2): 1 g via INTRAVENOUS
  Filled 2021-08-13: qty 1
  Filled 2021-08-13: qty 10

## 2021-08-13 MED ORDER — SUMATRIPTAN SUCCINATE 50 MG PO TABS
50.0000 mg | ORAL_TABLET | ORAL | Status: DC | PRN
  Administered 2021-08-13 – 2021-08-14 (×2): 50 mg via ORAL
  Filled 2021-08-13 (×2): qty 1

## 2021-08-13 MED ORDER — ACETAMINOPHEN 650 MG RE SUPP: 650.0000 mg | Freq: Four times a day (QID) | RECTAL | Status: DC | PRN

## 2021-08-13 MED ORDER — ONDANSETRON HCL 4 MG/2ML IJ SOLN: 4.0000 mg | Freq: Four times a day (QID) | INTRAMUSCULAR | Status: DC | PRN

## 2021-08-13 MED ORDER — ENOXAPARIN SODIUM 40 MG/0.4ML IJ SOSY
40.0000 mg | PREFILLED_SYRINGE | INTRAMUSCULAR | Status: DC
Start: 2021-08-13 — End: 2021-08-15
  Administered 2021-08-13 – 2021-08-14 (×2): 40 mg via SUBCUTANEOUS
  Filled 2021-08-13 (×2): qty 0.4

## 2021-08-13 MED ORDER — SODIUM CHLORIDE 0.9 % IV SOLN
1.0000 g | Freq: Once | INTRAVENOUS | Status: AC
  Administered 2021-08-13: 1 g via INTRAVENOUS
  Filled 2021-08-13: qty 10

## 2021-08-13 MED ORDER — ALPRAZOLAM 0.5 MG PO TABS
0.5000 mg | ORAL_TABLET | Freq: Two times a day (BID) | ORAL | Status: DC | PRN
  Administered 2021-08-13 – 2021-08-14 (×2): 0.5 mg via ORAL
  Filled 2021-08-13 (×2): qty 1

## 2021-08-13 MED ORDER — CITALOPRAM HYDROBROMIDE 20 MG PO TABS
40.0000 mg | ORAL_TABLET | Freq: Every day | ORAL | Status: DC
Start: 2021-08-13 — End: 2021-08-15
  Administered 2021-08-13 – 2021-08-14 (×2): 40 mg via ORAL
  Filled 2021-08-13 (×2): qty 2

## 2021-08-13 MED ORDER — PROPRANOLOL HCL 10 MG PO TABS
20.0000 mg | ORAL_TABLET | Freq: Two times a day (BID) | ORAL | Status: DC
  Administered 2021-08-14 – 2021-08-15 (×3): 20 mg via ORAL
  Filled 2021-08-13 (×3): qty 2

## 2021-08-13 MED ORDER — ACETAMINOPHEN 325 MG PO TABS
650.0000 mg | ORAL_TABLET | Freq: Once | ORAL | Status: AC
  Administered 2021-08-13: 650 mg via ORAL
  Filled 2021-08-13: qty 2

## 2021-08-13 MED ORDER — SODIUM CHLORIDE 0.9 % IV SOLN
1000.0000 mL | Freq: Once | INTRAVENOUS | Status: AC
  Administered 2021-08-13: 1000 mL via INTRAVENOUS

## 2021-08-13 MED ORDER — FENTANYL CITRATE PF 50 MCG/ML IJ SOSY
50.0000 ug | PREFILLED_SYRINGE | Freq: Once | INTRAMUSCULAR | Status: AC
  Administered 2021-08-13: 50 ug via INTRAVENOUS
  Filled 2021-08-13: qty 1

## 2021-08-13 MED ORDER — POLYETHYLENE GLYCOL 3350 17 G PO PACK: 17.0000 g | PACK | Freq: Every day | ORAL | Status: DC | PRN

## 2021-08-13 MED ORDER — ALBUTEROL SULFATE (2.5 MG/3ML) 0.083% IN NEBU: 3.0000 mL | INHALATION_SOLUTION | Freq: Four times a day (QID) | RESPIRATORY_TRACT | Status: DC | PRN

## 2021-08-13 MED ORDER — ONDANSETRON HCL 4 MG/2ML IJ SOLN
4.0000 mg | Freq: Once | INTRAMUSCULAR | Status: AC
  Administered 2021-08-13: 4 mg via INTRAVENOUS
  Filled 2021-08-13: qty 2

## 2021-08-13 NOTE — ED Provider Notes (Signed)
[facility] Provider Note    Event Date/Time   First MD Initiated Contact with Patient 08/13/21 1511     (approximate)   History   Flank Pain and Dysuria   HPI  Kristin Aguirre is a 61 y.o. female with a history of anxiety, high cholesterol, GERD who presents with complaints of dysuria and side pain.  Patient reports she started having pain in her right side yesterday, started gradually.  She has felt hot and cold but is not checked her temperature.  She is not diabetic.  She thinks that she has a UTI     Physical Exam   Triage Vital Signs: ED Triage Vitals  Enc Vitals Group     BP 08/13/21 1406 (!) 146/93     Pulse Rate 08/13/21 1406 (!) 112     Resp 08/13/21 1406 16     Temp 08/13/21 1409 98.8 F (37.1 C)     Temp Source 08/13/21 1409 Oral     SpO2 08/13/21 1406 100 %     Weight 08/13/21 1407 95.3 kg (210 lb)     Height 08/13/21 1407 1.753 m (5\' 9" )     Head Circumference --      Peak Flow --      Pain Score 08/13/21 1406 9     Pain Loc --      Pain Edu? --      Excl. in GC? --     Most recent vital signs: Vitals:   08/13/21 1406 08/13/21 1409  BP: (!) 146/93   Pulse: (!) 112   Resp: 16   Temp:  98.8 F (37.1 C)  SpO2: 100%      General: Awake, no distress.  CV:  Good peripheral perfusion.  Resp:  Normal effort.  Abd:  No distention.  Mild right CVA tenderness Other:     ED Results / Procedures / Treatments   Labs (all labs ordered are listed, but only abnormal results are displayed) Labs Reviewed  COMPREHENSIVE METABOLIC PANEL - Abnormal; Notable for the following components:      Result Value   Potassium 3.4 (*)    Glucose, Bld 157 (*)    Total Protein 8.3 (*)    All other components within normal limits  URINALYSIS, ROUTINE W REFLEX MICROSCOPIC - Abnormal; Notable for the following components:   Color, Urine AMBER (*)    APPearance HAZY (*)    Hgb urine dipstick MODERATE (*)    Ketones, ur 20 (*)    Protein, ur  100 (*)    Nitrite POSITIVE (*)    Leukocytes,Ua LARGE (*)    Bacteria, UA MANY (*)    All other components within normal limits  CBC WITH DIFFERENTIAL/PLATELET - Abnormal; Notable for the following components:   WBC 16.0 (*)    Neutro Abs 13.8 (*)    Abs Immature Granulocytes 0.08 (*)    All other components within normal limits  CULTURE, BLOOD (ROUTINE X 2)  CULTURE, BLOOD (ROUTINE X 2)  URINE CULTURE  LACTIC ACID, PLASMA     EKG     RADIOLOGY CT renal stone study viewed interpret by me, no stone noted, pending radiology review    PROCEDURES:  Critical Care performed:   Procedures   MEDICATIONS ORDERED IN ED: Medications  cefTRIAXone (ROCEPHIN) 1 g in sodium chloride 0.9 % 100 mL IVPB (0 g Intravenous Stopped 08/13/21 1703)  0.9 %  sodium chloride infusion (0 mLs Intravenous Stopped 08/13/21  1704)  morphine (PF) 4 MG/ML injection 4 mg (4 mg Intravenous Given 08/13/21 1544)  ondansetron (ZOFRAN) injection 4 mg (4 mg Intravenous Given 08/13/21 1545)  fentaNYL (SUBLIMAZE) injection 50 mcg (50 mcg Intravenous Given 08/13/21 1704)     IMPRESSION / MDM / ASSESSMENT AND PLAN / ED COURSE  I reviewed the triage vital signs and the nursing notes. Patient's presentation is most consistent with acute presentation with potential threat to life or bodily function.  Patient presents with right flank/back pain combined with dysuria.  Differential includes pyelonephritis, UTI, kidney stone, infected kidney stone  We will give IV morphine, IV Zofran, IV fluids  Urinalysis is consistent with UTI, IV Rocephin ordered  Lactic acid is normal however white blood cell count is elevated at 16 consistent with infection  We will obtain CT renal stone study to rule out kidney stone  CT scan is consistent with pyelonephritis  Patient continues to have pain, will give IV fentanyl  She will require admission for further treatment of kidney infection, have consulted the hospitalist  team     FINAL CLINICAL IMPRESSION(S) / ED DIAGNOSES   Final diagnoses:  Pyelonephritis     Rx / DC Orders   ED Discharge Orders     None        Note:  This document was prepared using Dragon voice recognition software and may include unintentional dictation errors.   Kristin Aguirre, Jericka Kadar, MD 08/13/21 [phone]

## 2021-08-13 NOTE — Progress Notes (Signed)
Patient arrived from the ED  

## 2021-08-13 NOTE — H&P (Addendum)
History and Physical:    Kristin Aguirre   ULA:[id number] DOB: [birth year] DOA: 08/13/2021  Referring MD/provider: Beau Fanny, MD PCP: Gwyneth Sprout, FNP   Patient coming from: Home  Chief Complaint: Right flank pain  History of Present Illness:   Kristin Aguirre is a 61 y.o. female with medical history significant for anxiety, GERD, hyperlipidemia, osteoarthritis of the knees, migraine, who presented to the hospital with severe right flank pain.  Pain started a day prior to admission.  It starts from the right mid back and moves across the right side to the right upper and lower quadrant.  There are no known relieving or aggravating factors.  She started having dysuria and increased frequency of micturition about 2 days ago.  She used over-the-counter Pyridium but it did not help.  She has also had nausea but no vomiting.  She did not check her temperature at home so she does not know whether she had fever or not.  No chest pain, shortness of breath, cough, wheezing, palpitations .  ED Course:  The patient was febrile (100.4) and tachycardic heart rate 112 in the emergency room.  Urinalysis was suggestive of UTI.  She was given 1 L of normal saline, IV morphine, IV fentanyl, IV Zofran and IV Rocephin in the ED.   ROS:   ROS all other systems reviewed were negative  Past Medical History:   Past Medical History:  Diagnosis Date   Allergy    Anxiety    Diffuse cystic mastopathy    LEFT SIDE   GERD (gastroesophageal reflux disease)    High cholesterol    PONV (postoperative nausea and vomiting)     Past Surgical History:   Past Surgical History:  Procedure Laterality Date   ABDOMINAL HYSTERECTOMY  2003   BREAST BIOPSY Left 2011, 2015   neg   BREAST BIOPSY Left 07/17/2009   Vacuum biopsy, 4:00 position: 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   cervical spine repair      CESAREAN  SECTION     TONSILLECTOMY      Social History:   Social History   Socioeconomic History   Marital status: Single    Spouse name: Not on file   Number of children: Not on file   Years of education: Not on file   Highest education level: Not on file  Occupational History   Not on file  Tobacco Use   Smoking status: Every Day    Packs/day: 0.25    Types: Cigarettes    Start date: 04/03/2012    Last attempt to quit: 10/26/2019    Years since quitting: 1.8   Smokeless tobacco: Never   Tobacco comments:    SMokes 1 cigarettes daily  Vaping Use   Vaping Use: Every day  Substance and Sexual Activity   Alcohol use: Yes    Alcohol/week: 5.0 standard drinks    Types: 5 Standard drinks or equivalent per week   Drug use: No   Sexual activity: Not on file  Other Topics Concern   Not on file  Social History Narrative   Not on file   Social Determinants of Health   Financial Resource Strain: Not on file  Food Insecurity: Not on file  Transportation Needs: Not on file  Physical Activity: Not on file  Stress: Not on file  Social Connections: Not on file  Intimate Partner Violence: Not on file    Allergies  Furosemide  Family history:   Family History  Problem Relation Age of Onset   Heart block Mother    Cancer Mother        on leg   Diabetes Mother    Heart disease Mother    Diabetes Father    Heart disease Father    CVA Father    Asthma Sister    Anemia Sister    Seizures Brother    Breast cancer Maternal Grandmother 58    Current Medications:   Prior to Admission medications   Medication Sig Start Date End Date Taking? Authorizing Provider  albuterol (VENTOLIN HFA) 108 (90 Base) MCG/ACT inhaler Inhale 1-2 puffs into the lungs every 6 (six) hours as needed (cough). 01/12/21   Boddu, Erasmo Downer, FNP  ALPRAZolam Duanne Moron) 0.5 MG tablet Take 1 tablet (0.5 mg total) by mouth 2 (two) times daily as needed. Due for 6 month controlled substance office visit. Please  call and schedule an appointment. 06/05/21   Gwyneth Sprout, FNP  Ascorbic Acid (VITAMIN C PO) Take 1 tablet by mouth daily at 12 noon. 04/04/14   [provider]  aspirin EC 81 MG tablet Take 81 mg by mouth daily. Swallow whole.    [provider]  benzonatate (TESSALON) 100 MG capsule Take 1 capsule (100 mg total) by mouth 3 (three) times daily as needed for cough. 05/10/21   Brunetta Jeans, PA-C  citalopram (CELEXA) 40 MG tablet Take 1 tablet (40 mg total) by mouth at bedtime. 10/20/20   Gwyneth Sprout, FNP  diclofenac Sodium (VOLTAREN) 1 % GEL Apply 4 g topically 4 (four) times daily as needed. 10/20/20   Gwyneth Sprout, FNP  fluticasone (FLONASE) 50 MCG/ACT nasal spray Place 2 sprays into both nostrils daily. 10/20/20   Gwyneth Sprout, FNP  hydrochlorothiazide (HYDRODIURIL) 25 MG tablet Take 1 tablet (25 mg total) by mouth daily. 06/20/21   Gwyneth Sprout, FNP  lisinopril (ZESTRIL) 10 MG tablet Take 1 tablet (10 mg total) by mouth daily. 10/20/20   Gwyneth Sprout, FNP  meloxicam (MOBIC) 15 MG tablet Take 15 mg by mouth daily. 04/09/21   [provider]  Multiple Vitamins-Minerals (ALIVE ONCE DAILY WOMENS PO) Take 2 tablets by mouth daily at 12 noon. gummies    [provider]  ondansetron (ZOFRAN) 4 MG tablet TAKE 1 TABLET BY MOUTH EVERY 8 HOURS AS NEEDED FOR NAUSEA OR VOMITING 04/12/21   Gwyneth Sprout, FNP  pantoprazole (PROTONIX) 40 MG tablet TAKE 1 TABLET BY MOUTH ONCE DAILY Patient taking differently: Take 40 mg by mouth daily at 12 noon. 02/22/20   Mar Daring, PA-C  propranolol (INDERAL) 20 MG tablet Take 1 tablet (20 mg total) by mouth 2 (two) times daily. 06/20/21   Gwyneth Sprout, FNP  rosuvastatin (CRESTOR) 10 MG tablet Take 1 tablet (10 mg total) by mouth daily. 10/23/20   Gwyneth Sprout, FNP  Sod Picosulfate-Mag Ox-Cit Acd [facility]) 10-3.5-12 MG-GM -GM/160ML SOLN Take 1 kit by mouth as directed. At 5 PM evening before procedure, drink 1 bottle of Clenpiq,  hydrate, drink (5) 8 oz of water. Then do the same thing 5 hours prior to your procedure. 11/08/20   Jonathon Bellows, MD  SUMAtriptan (IMITREX) 50 MG tablet Take 1 tablet (50 mg total) by mouth every 2 (two) hours as needed for migraine (2 doses max per day). May repeat in 2 hours if headache persists or recurs. 06/20/21   Gwyneth Sprout,  FNP  Vitamin D, Ergocalciferol, (DRISDOL) 1.25 MG (50000 UNIT) CAPS capsule Take 1 capsule (50,000 Units total) by mouth once a week. 06/20/21   Gwyneth Sprout, FNP    Physical Exam:   Vitals:   08/13/21 1406 08/13/21 1407 08/13/21 1409 08/13/21 1706  BP: (!) 146/93   137/85  Pulse: (!) 112   91  Resp: 16   17  Temp:   98.8 F (37.1 C) (!) 100.4 F (38 C)  TempSrc:   Oral Oral  SpO2: 100%   99%  Weight:  95.3 kg    Height:  $Remove'5\' 9"'QkpDyoV$  (1.753 m)       Physical Exam: Blood pressure 137/85, pulse 91, temperature (!) 100.4 F (38 C), temperature source Oral, resp. rate 17, height $RemoveBe'5\' 9"'QZyzSKjNa$  (1.753 m), weight 95.3 kg, last menstrual period 03/18/2001, SpO2 99 %. Gen: No acute distress. Head: Normocephalic, atraumatic. Eyes: Pupils equal, round and reactive to light. Extraocular movements intact.  Sclerae nonicteric.  Mouth: Dry mucous membranes Neck: Supple, no thyromegaly, no lymphadenopathy, no jugular venous distention. Chest: Lungs are clear to auscultation with good air movement. No rales, rhonchi or wheezes.  CV: Heart sounds are regular with an S1, S2. No murmurs, rubs or gallops.  Abdomen: Soft, suprapubic tenderness, obese with normal active bowel sounds. No palpable masses. GU: Right costovertebral angle tenderness Extremities: Extremities are without clubbing, or cyanosis. No edema. Pedal pulses 2+.  Skin: Warm and dry. No rashes, lesions or wounds Neuro: Alert and oriented times 3; grossly nonfocal.  Psych: Insight is good and judgment is appropriate. Mood and affect normal.   Data Review:    Labs: Basic Metabolic Panel: Recent Labs  Lab  08/13/21 1412  NA 137  K 3.4*  CL 99  CO2 26  GLUCOSE 157*  BUN 13  CREATININE 0.61  CALCIUM 9.2   Liver Function Tests: Recent Labs  Lab 08/13/21 1412  AST 19  ALT 17  ALKPHOS 98  BILITOT 0.9  PROT 8.3*  ALBUMIN 4.0   No results for input(s): LIPASE, AMYLASE in the last 168 hours. No results for input(s): AMMONIA in the last 168 hours. CBC: Recent Labs  Lab 08/13/21 1412  WBC 16.0*  NEUTROABS 13.8*  HGB 12.6  HCT 40.4  MCV 91.6  PLT 331   Cardiac Enzymes: No results for input(s): CKTOTAL, CKMB, CKMBINDEX, TROPONINI in the last 168 hours.  BNP (last 3 results) No results for input(s): PROBNP in the last 8760 hours. CBG: No results for input(s): GLUCAP in the last 168 hours.  Urinalysis    Component Value Date/Time   COLORURINE AMBER (A) 08/13/2021 1412   APPEARANCEUR HAZY (A) 08/13/2021 1412   LABSPEC 1.013 08/13/2021 1412   PHURINE 6.0 08/13/2021 1412   GLUCOSEU NEGATIVE 08/13/2021 1412   HGBUR MODERATE (A) 08/13/2021 1412   BILIRUBINUR NEGATIVE 08/13/2021 1412   KETONESUR 20 (A) 08/13/2021 1412   PROTEINUR 100 (A) 08/13/2021 1412   NITRITE POSITIVE (A) 08/13/2021 1412   LEUKOCYTESUR LARGE (A) 08/13/2021 1412      Radiographic Studies: CT Renal Stone Study  Result Date: 08/13/2021 CLINICAL DATA:  Right flank pain since yesterday, dysuria EXAM: CT ABDOMEN AND PELVIS WITHOUT CONTRAST TECHNIQUE: Multidetector CT imaging of the abdomen and pelvis was performed following the standard protocol without IV contrast. RADIATION DOSE REDUCTION: This exam was performed according to the departmental dose-optimization program which includes automated exposure control, adjustment of the mA and/or kV according to patient size and/or use of iterative reconstruction  technique. COMPARISON:  None Available. FINDINGS: Lower chest: No acute pleural or parenchymal lung disease. Hepatobiliary: Unremarkable unenhanced appearance of the liver. Gallbladder is not identified and  likely surgically absent. Pancreas: Unremarkable unenhanced appearance. Spleen: Unremarkable unenhanced appearance. Adrenals/Urinary Tract: 1 cm left adrenal nodule with attenuation of -11 HU consistent with adenoma. The right adrenal is unremarkable. Mild right-sided hydronephrosis and hydroureter is identified, with minimal right mid Peri ureteral fat stranding. No radiopaque calculus or other source of obstruction is identified. Findings could reflect obstructing radiolucent calculus, recent passage of a calculus, or infection. Please correlate with urinalysis. The left kidney is unremarkable. Bladder is minimally distended with no gross abnormalities. Stomach/Bowel: No bowel obstruction or ileus. Scattered diverticulosis of the distal colon without evidence of acute diverticulitis. Normal retrocecal appendix. No bowel wall thickening or inflammatory change. Vascular/Lymphatic: Aortic atherosclerosis. No enlarged abdominal or pelvic lymph nodes. Reproductive: Status post hysterectomy. No adnexal masses. Other: No free fluid or free intraperitoneal gas. Small fat containing umbilical and supraumbilical ventral hernias. No bowel herniation or incarceration. Musculoskeletal: No acute or destructive bony lesions. Reconstructed images demonstrate no additional findings. IMPRESSION: 1. Mild right-sided hydronephrosis and hydroureter, without clear cause for obstruction. This most likely reflects recent passage of a calculus or infection. Please correlate with urinalysis. 2. Distal colonic diverticulosis without diverticulitis. 3. 1 cm left adrenal mass, consistent with lipid-rich benign adenoma. No follow-up imaging is recommended. JACR 2017 Aug; 14(8):1038-44, JCAT 2016 Mar-Apr; 40(2):194-200, Urol J 2006 Spring; 3(2):71-4. 4.  Aortic Atherosclerosis (ICD10-I70.0). Electronically Signed   By: Randa Ngo M.D.   On: 08/13/2021 16:33    EKG: None   Assessment/Plan:   Principal Problem:   Sepsis  ([place])    Body mass index is 31.01 kg/m.  (Obesity)    Sepsis secondary to acute pyelonephritis, leukocytosis: Treat with IV Rocephin.  She got 1 L of normal saline in the ED.  Treat with normal saline infusion at maintenance rate of 75 cc/h.  Follow-up urine and blood cultures.  Hypokalemia: Replete potassium and monitor levels.  Check magnesium level  Hyperglycemia: Check hemoglobin A1c  Hypertension: Hold lisinopril and HCTZ.  Continue propranolol  Anxiety: Continue Celexa and Xanax as needed  Migraine headache: Continue sumatriptan as needed  Other information:   DVT prophylaxis:   Lovenox  Code Status: Full code. Family Communication: None Disposition Plan: Plan to discharge home in 2 to 3 days Consults called: None Admission status: Inpatient  The medical decision making on this patient was of high complexity and the patient is at high risk for clinical deterioration, therefore this is a level 3 visit.    Maloni Musleh [facility] Pager: Please check [email or web address]   How to contact the [facility] Attending or Consulting provider [place] or covering provider during after hours [place], for this patient?   Check the care team in [facility] and look for a) attending/consulting [facility] provider listed and b) the [facility] team listed Log into [email or web address] and use [place]'s universal password to access. If you do not have the password, please contact the hospital operator. Locate the [facility] provider you are looking for under [facility] and page to a number that you can be directly reached. If you still have difficulty reaching the provider, please page the [facility] (Director on Call) for the Hospitalists listed on amion for assistance.  08/13/2021, 5:38 PM

## 2021-08-13 NOTE — ED Triage Notes (Signed)
Pt to ED via POV c/o right flank pain. Pt also c/o dysuria. Pt states that the pain started yesterday. Pt denies fever. Pt states that she is having nausea. Pt is in NAD. Pt states that she has been sleeping a lot the last few days.

## 2021-08-13 NOTE — ED Provider Triage Note (Signed)
Emergency Medicine Provider Triage Evaluation Note  Kristin Aguirre , a 61 y.o. female  was evaluated in triage.  Pt complains of weakness, UTI symptoms, nausea, flank pain.  Review of Systems  Positive: See above Negative: Vomiting  Physical Exam  BP (!) 146/93 (BP Location: Left Arm)   Pulse (!) 112   Resp 16   Ht 5\' 9"  (1.753 m)   Wt 95.3 kg   LMP 03/18/2001 (Approximate)   SpO2 100%   BMI 31.01 kg/m  Gen:   Awake, no distress   Resp:  Normal effort  MSK:   Moves extremities without difficulty  Other:    Medical Decision Making  Medically screening exam initiated at 2:08 PM.  Appropriate orders placed.  Abbygael H Aguirre was informed that the remainder of the evaluation will be completed by another provider, this initial triage assessment does not replace that evaluation, and the importance of remaining in the ED until their evaluation is complete.  Instructed nursing staff to do sepsis protocols due to patient's heart rate being elevated, concerns for urosepsis   Versie Starks, PA-C 08/13/21 1408

## 2021-08-13 NOTE — ED Notes (Signed)
Julia RN aware of assigned bed 

## 2021-08-14 DIAGNOSIS — E876 Hypokalemia: Secondary | ICD-10-CM | POA: Diagnosis present

## 2021-08-14 DIAGNOSIS — N10 Acute tubulo-interstitial nephritis: Secondary | ICD-10-CM

## 2021-08-14 DIAGNOSIS — A419 Sepsis, unspecified organism: Secondary | ICD-10-CM | POA: Diagnosis not present

## 2021-08-14 LAB — HIV ANTIBODY (ROUTINE TESTING W REFLEX): HIV Screen 4th Generation wRfx: NONREACTIVE

## 2021-08-14 LAB — BASIC METABOLIC PANEL
Anion gap: 5 (ref 5–15)
BUN: 10 mg/dL (ref 8–23)
CO2: 29 mmol/L (ref 22–32)
Calcium: 8.6 mg/dL — ABNORMAL LOW (ref 8.9–10.3)
Chloride: 108 mmol/L (ref 98–111)
Creatinine, Ser: 0.58 mg/dL (ref 0.44–1.00)
GFR, Estimated: >60 mL/min (ref >60)
Glucose, Bld: 108 mg/dL — ABNORMAL HIGH (ref 70–99)
Potassium: 3.5 mmol/L (ref 3.5–5.1)
Sodium: 142 mmol/L (ref 135–145)

## 2021-08-14 LAB — CBC
HCT: 34.4 % — ABNORMAL LOW (ref 36.0–46.0)
Hemoglobin: 10.8 g/dL — ABNORMAL LOW (ref 12.0–15.0)
MCH: 28.6 pg (ref 26.0–34.0)
MCHC: 31.4 g/dL (ref 30.0–36.0)
MCV: 91.2 fL (ref 80.0–100.0)
Platelets: 264 10*3/uL (ref 150–400)
RBC: 3.77 MIL/uL — ABNORMAL LOW (ref 3.87–5.11)
RDW: 11.9 % (ref 11.5–15.5)
WBC: 10.5 10*3/uL (ref 4.0–10.5)
nRBC: 0 % (ref 0.0–0.2)

## 2021-08-14 LAB — MAGNESIUM: Magnesium: 2.3 mg/dL (ref 1.7–2.4)

## 2021-08-14 MED ORDER — OXYCODONE HCL 5 MG PO TABS: 5.0000 mg | ORAL_TABLET | Freq: Four times a day (QID) | ORAL | Status: AC | PRN

## 2021-08-14 MED ORDER — POTASSIUM CHLORIDE CRYS ER 20 MEQ PO TBCR
40.0000 meq | EXTENDED_RELEASE_TABLET | Freq: Once | ORAL | Status: AC
  Administered 2021-08-14: 40 meq via ORAL
  Filled 2021-08-14: qty 2

## 2021-08-14 NOTE — Progress Notes (Addendum)
Progress Note    KRISTLYN Aguirre  [id number] DOB: [birth year]  DOA: 08/13/2021 PCP: Gwyneth Sprout, FNP      Brief Narrative:    Medical records reviewed and are as summarized below:  Kristin Aguirre is a 61 y.o. female with medical history significant for anxiety, GERD, hyperlipidemia, osteoarthritis of the knees, migraine, who presented to the hospital with severe right flank pain, dysuria, increased frequency of micturition and nausea.  She had taking Pyridium at home without any improvement in her symptoms.  In the ED, she was febrile with a temperature of 100.4 F and tachycardic.  She was admitted to the hospital for sepsis secondary to acute pyelonephritis.  She was treated with analgesics, antiemetics, IV Rocephin and IV fluids.       Assessment/Plan:   Principal Problem:   Sepsis ([place]) Active Problems:   Acute pyelonephritis   Hypokalemia   Body mass index is 31.01 kg/m.  (Obesity)   Sepsis secondary to acute pyelonephritis: Continue IV Rocephin.  Follow-up urine and blood cultures.  Continue analgesics as needed for pain.  Leukocytosis has improved.  Hypokalemia: Improved.  Continue potassium repletion.  Hyperglycemia: Improved  Hypertension: Continue propranolol  Continue anxiolytics for anxiety and sumatriptan as needed for migraine headaches.  Diet Order             Diet Heart Room service appropriate? Yes; Fluid consistency: Thin  Diet effective now                            Consultants: None  Procedures: None    Medications:    aspirin EC  81 mg Oral Daily   citalopram  40 mg Oral QHS   enoxaparin (LOVENOX) injection  40 mg Subcutaneous Q24H   propranolol  20 mg Oral BID   rosuvastatin  10 mg Oral Daily   Continuous Infusions:  cefTRIAXone (ROCEPHIN)  IV 1 g (08/14/21 1027)     Anti-infectives (From admission, onward)    Start     Dose/Rate Route Frequency Ordered Stop   08/14/21 1000  cefTRIAXone  (ROCEPHIN) 1 g in sodium chloride 0.9 % 100 mL IVPB        1 g 200 mL/hr over 30 Minutes Intravenous Every 24 hours 08/13/21 1827 08/18/21 0959   08/13/21 1530  cefTRIAXone (ROCEPHIN) 1 g in sodium chloride 0.9 % 100 mL IVPB        1 g 200 mL/hr over 30 Minutes Intravenous  Once 08/13/21 1523 08/13/21 1703              Family Communication/Anticipated D/C date and plan/Code Status   DVT prophylaxis: enoxaparin (LOVENOX) injection 40 mg Start: 08/13/21 2200 SCDs Start: 08/13/21 1827     Code Status: Full Code  Family Communication: Plan discussed with the boyfriend at the bedside Disposition Plan: Plan to discharge home tomorrow   Status is: Observation The patient will require care spanning > 2 midnights and should be moved to inpatient because: IV antibiotics, awaiting urine and blood cultures       Subjective:   Interval events noted.  She complains of right flank pain and headache.  Objective:    Vitals:   08/13/21 1833 08/13/21 1918 08/14/21 0402 08/14/21 0755  BP: (!) 156/80 125/61 134/75 (!) 143/75  Pulse: 97 89 71 75  Resp: 16 16 20 16   Temp: 98.4 F (36.9 C) 99.1 F (37.3 C) 98.8 F (37.1  C) 98.2 F (36.8 C)  TempSrc: Oral Oral Oral Oral  SpO2: 98% 100% 98% 93%  Weight:      Height:       No data found.   Intake/Output Summary (Last 24 hours) at 08/14/2021 1242 Last data filed at 08/14/2021 1028 Gross per 24 hour  Intake 1220 ml  Output 800 ml  Net 420 ml   Filed Weights   08/13/21 1407  Weight: 95.3 kg    Exam:  GEN: NAD SKIN: Warm and dry EYES: No pallor or icterus ENT: MMM CV: RRR PULM: CTA B ABD: soft, obese, right-sided suprapubic tenderness, +BS CNS: AAO x 3, non focal EXT: No edema or tenderness GU: Right costovertebral angle tenderness       Data Reviewed:   I have personally reviewed following labs and imaging studies:  Labs: Labs show the following:   Basic Metabolic Panel: Recent Labs  Lab  08/13/21 1412 08/14/21 0406  NA 137 142  K 3.4* 3.5  CL 99 108  CO2 26 29  GLUCOSE 157* 108*  BUN 13 10  CREATININE 0.61 0.58  CALCIUM 9.2 8.6*  MG 2.2 2.3   GFR Estimated Creatinine Clearance: 90.7 mL/min (by C-G formula based on SCr of 0.58 mg/dL). Liver Function Tests: Recent Labs  Lab 08/13/21 1412  AST 19  ALT 17  ALKPHOS 98  BILITOT 0.9  PROT 8.3*  ALBUMIN 4.0   No results for input(s): LIPASE, AMYLASE in the last 168 hours. No results for input(s): AMMONIA in the last 168 hours. Coagulation profile No results for input(s): INR, PROTIME in the last 168 hours.  CBC: Recent Labs  Lab 08/13/21 1412 08/14/21 0406  WBC 16.0* 10.5  NEUTROABS 13.8*  --   HGB 12.6 10.8*  HCT 40.4 34.4*  MCV 91.6 91.2  PLT 331 264   Cardiac Enzymes: No results for input(s): CKTOTAL, CKMB, CKMBINDEX, TROPONINI in the last 168 hours. BNP (last 3 results) No results for input(s): PROBNP in the last 8760 hours. CBG: No results for input(s): GLUCAP in the last 168 hours. D-Dimer: No results for input(s): DDIMER in the last 72 hours. Hgb A1c: No results for input(s): HGBA1C in the last 72 hours. Lipid Profile: No results for input(s): CHOL, HDL, LDLCALC, TRIG, CHOLHDL, LDLDIRECT in the last 72 hours. Thyroid function studies: No results for input(s): TSH, T4TOTAL, T3FREE, THYROIDAB in the last 72 hours.  Invalid input(s): FREET3 Anemia work up: No results for input(s): VITAMINB12, FOLATE, FERRITIN, TIBC, IRON, RETICCTPCT in the last 72 hours. Sepsis Labs: Recent Labs  Lab 08/13/21 1412 08/14/21 0406  WBC 16.0* 10.5  LATICACIDVEN 1.4  --     Microbiology Recent Results (from the past 240 hour(s))  Culture, blood (Routine X 2) w Reflex to ID Panel     Status: None (Preliminary result)   Collection Time: 08/13/21  2:12 PM   Specimen: BLOOD  Result Value Ref Range Status   Specimen Description BLOOD RIGHT ANTECUBITAL  Final   Special Requests   Final    BOTTLES DRAWN  AEROBIC AND ANAEROBIC Blood Culture adequate volume   Culture   Final    NO GROWTH < 24 HOURS Performed at [facility], [address] [place], [place] [address]    Report Status PENDING  Incomplete  Culture, blood (Routine X 2) w Reflex to ID Panel     Status: None (Preliminary result)   Collection Time: 08/13/21  7:15 PM   Specimen: BLOOD  Result Value  Ref Range Status   Specimen Description BLOOD LEFT ANTECUBITAL  Final   Special Requests   Final    BOTTLES DRAWN AEROBIC AND ANAEROBIC Blood Culture adequate volume   Culture   Final    NO GROWTH < 12 HOURS Performed at [facility], [address], [place], [place] [address]    Report Status PENDING  Incomplete    Procedures and diagnostic studies:  CT Renal Stone Study  Result Date: 08/13/2021 CLINICAL DATA:  Right flank pain since yesterday, dysuria EXAM: CT ABDOMEN AND PELVIS WITHOUT CONTRAST TECHNIQUE: Multidetector CT imaging of the abdomen and pelvis was performed following the standard protocol without IV contrast. RADIATION DOSE REDUCTION: This exam was performed according to the departmental dose-optimization program which includes automated exposure control, adjustment of the mA and/or kV according to patient size and/or use of iterative reconstruction technique. COMPARISON:  None Available. FINDINGS: Lower chest: No acute pleural or parenchymal lung disease. Hepatobiliary: Unremarkable unenhanced appearance of the liver. Gallbladder is not identified and likely surgically absent. Pancreas: Unremarkable unenhanced appearance. Spleen: Unremarkable unenhanced appearance. Adrenals/Urinary Tract: 1 cm left adrenal nodule with attenuation of -11 HU consistent with adenoma. The right adrenal is unremarkable. Mild right-sided hydronephrosis and hydroureter is identified, with minimal right mid Peri ureteral fat stranding. No radiopaque calculus or other source of obstruction is identified. Findings could reflect  obstructing radiolucent calculus, recent passage of a calculus, or infection. Please correlate with urinalysis. The left kidney is unremarkable. Bladder is minimally distended with no gross abnormalities. Stomach/Bowel: No bowel obstruction or ileus. Scattered diverticulosis of the distal colon without evidence of acute diverticulitis. Normal retrocecal appendix. No bowel wall thickening or inflammatory change. Vascular/Lymphatic: Aortic atherosclerosis. No enlarged abdominal or pelvic lymph nodes. Reproductive: Status post hysterectomy. No adnexal masses. Other: No free fluid or free intraperitoneal gas. Small fat containing umbilical and supraumbilical ventral hernias. No bowel herniation or incarceration. Musculoskeletal: No acute or destructive bony lesions. Reconstructed images demonstrate no additional findings. IMPRESSION: 1. Mild right-sided hydronephrosis and hydroureter, without clear cause for obstruction. This most likely reflects recent passage of a calculus or infection. Please correlate with urinalysis. 2. Distal colonic diverticulosis without diverticulitis. 3. 1 cm left adrenal mass, consistent with lipid-rich benign adenoma. No follow-up imaging is recommended. JACR 2017 Aug; 14(8):1038-44, JCAT 2016 Mar-Apr; 40(2):194-200, Urol J 2006 Spring; 3(2):71-4. 4.  Aortic Atherosclerosis (ICD10-I70.0). Electronically Signed   By: Randa Ngo M.D.   On: 08/13/2021 16:33               LOS: 1 day   Danny Zimny  [facility]   Pager on [email or web address]. If 7PM-7AM, please contact night-coverage at [email or web address]     08/14/2021, 12:42 PM

## 2021-08-15 DIAGNOSIS — N10 Acute tubulo-interstitial nephritis: Secondary | ICD-10-CM | POA: Diagnosis not present

## 2021-08-15 DIAGNOSIS — A4151 Sepsis due to Escherichia coli [E. coli]: Secondary | ICD-10-CM | POA: Diagnosis not present

## 2021-08-15 DIAGNOSIS — E876 Hypokalemia: Secondary | ICD-10-CM | POA: Diagnosis not present

## 2021-08-15 LAB — URINE CULTURE: Culture: >=100000 — AB

## 2021-08-15 MED ORDER — LEVOFLOXACIN 750 MG PO TABS: 750.0000 mg | ORAL_TABLET | Freq: Every day | ORAL | 0 refills | Status: AC

## 2021-08-15 NOTE — Discharge Summary (Signed)
Physician Discharge Summary   Patient: Kristin Aguirre MRN: [id number] DOB: [birth year]  Admit date:     08/13/2021  Discharge date: 08/15/21  Discharge Physician: Jennye Boroughs   PCP: Gwyneth Sprout, FNP   Recommendations at discharge:   Follow up with PCP in 1 week  Discharge Diagnoses: Principal Problem:   Sepsis ([place]) Active Problems:   Acute pyelonephritis   Hypokalemia  Resolved Problems:   * No resolved hospital problems. [facility] Course:  Ms. Kristin Aguirre is a 61 y.o. female with medical history significant for anxiety, GERD, hyperlipidemia, osteoarthritis of the knees, migraine, who presented to the hospital with severe right flank pain, dysuria, increased frequency of micturition and nausea.  She had taking Pyridium at home without any improvement in her symptoms.  In the ED, she was febrile with a temperature of 100.4 F and tachycardic.   She was admitted to the hospital for sepsis secondary to acute pyelonephritis.  She was treated with analgesics, antiemetics, IV Rocephin and IV fluids.  Urine culture showed E. coli.  She has had hypokalemia that was repleted.  Her symptoms have improved and she is deemed stable for discharge to home today.  She will be discharged on a 5-day course of oral Levaquin.  Side effect profile of Levaquin including but not limited to serious adverse effects were discussed.            Consultants: None Procedures performed: None Disposition: Home Diet recommendation:  Discharge Diet Orders (From admission, onward)     Start     Ordered   08/15/21 0000  Diet - low sodium heart healthy        08/15/21 1156           Cardiac diet DISCHARGE MEDICATION: Allergies as of 08/15/2021       Reactions   Furosemide Rash        Medication List     STOP taking these medications    benzonatate 100 MG capsule Commonly known as: TESSALON   diclofenac Sodium 1 % Gel Commonly known as: Voltaren   meloxicam 15 MG  tablet Commonly known as: MOBIC   ondansetron 4 MG tablet Commonly known as: ZOFRAN       TAKE these medications    albuterol 108 (90 Base) MCG/ACT inhaler Commonly known as: VENTOLIN HFA Inhale 1-2 puffs into the lungs every 6 (six) hours as needed (cough).   ALIVE ONCE DAILY WOMENS PO Take 2 tablets by mouth daily at 12 noon. gummies   ALPRAZolam 0.5 MG tablet Commonly known as: XANAX Take 1 tablet (0.5 mg total) by mouth 2 (two) times daily as needed. Due for 6 month controlled substance office visit. Please call and schedule an appointment.   aspirin EC 81 MG tablet Take 81 mg by mouth daily. Swallow whole.   citalopram 40 MG tablet Commonly known as: CELEXA Take 1 tablet (40 mg total) by mouth at bedtime.   Clenpiq 10-3.5-12 MG-GM -GM/160ML Soln Generic drug: Sod Picosulfate-Mag Ox-Cit Acd Take 1 kit by mouth as directed. At 5 PM evening before procedure, drink 1 bottle of Clenpiq, hydrate, drink (5) 8 oz of water. Then do the same thing 5 hours prior to your procedure.   fluticasone 50 MCG/ACT nasal spray Commonly known as: FLONASE Place 2 sprays into both nostrils daily.   hydrochlorothiazide 25 MG tablet Commonly known as: HYDRODIURIL Take 1 tablet (25 mg total) by mouth daily.   levofloxacin 750 MG tablet Commonly known  as: Levaquin Take 1 tablet (750 mg total) by mouth daily for 5 days. Start taking on: August 16, 2021   lisinopril 10 MG tablet Commonly known as: ZESTRIL Take 1 tablet (10 mg total) by mouth daily.   pantoprazole 40 MG tablet Commonly known as: PROTONIX TAKE 1 TABLET BY MOUTH ONCE DAILY What changed: when to take this   propranolol 20 MG tablet Commonly known as: INDERAL Take 1 tablet (20 mg total) by mouth 2 (two) times daily.   rosuvastatin 10 MG tablet Commonly known as: Crestor Take 1 tablet (10 mg total) by mouth daily.   SUMAtriptan 50 MG tablet Commonly known as: IMITREX Take 1 tablet (50 mg total) by mouth every 2 (two)  hours as needed for migraine (2 doses max per day). May repeat in 2 hours if headache persists or recurs.   VITAMIN C PO Take 1 tablet by mouth daily at 12 noon.   Vitamin D (Ergocalciferol) 1.25 MG (50000 UNIT) Caps capsule Commonly known as: DRISDOL Take 1 capsule (50,000 Units total) by mouth once a week.        Discharge Exam: Filed Weights   08/13/21 1407 08/15/21 0500  Weight: 95.3 kg 101 kg   GEN: NAD SKIN: Warm and dry EYES: No pallor or icterus ENT: MMM CV: RRR PULM: CTA B ABD: soft, obese, right-sided suprapubic tenderness is improving, +BS CNS: AAO x 3, non focal EXT: No edema or tenderness GU: Right CVA tenderness is improving  Condition at discharge: good  The results of significant diagnostics from this hospitalization (including imaging, microbiology, ancillary and laboratory) are listed below for reference.   Imaging Studies: CT Renal Stone Study  Result Date: 08/13/2021 CLINICAL DATA:  Right flank pain since yesterday, dysuria EXAM: CT ABDOMEN AND PELVIS WITHOUT CONTRAST TECHNIQUE: Multidetector CT imaging of the abdomen and pelvis was performed following the standard protocol without IV contrast. RADIATION DOSE REDUCTION: This exam was performed according to the departmental dose-optimization program which includes automated exposure control, adjustment of the mA and/or kV according to patient size and/or use of iterative reconstruction technique. COMPARISON:  None Available. FINDINGS: Lower chest: No acute pleural or parenchymal lung disease. Hepatobiliary: Unremarkable unenhanced appearance of the liver. Gallbladder is not identified and likely surgically absent. Pancreas: Unremarkable unenhanced appearance. Spleen: Unremarkable unenhanced appearance. Adrenals/Urinary Tract: 1 cm left adrenal nodule with attenuation of -11 HU consistent with adenoma. The right adrenal is unremarkable. Mild right-sided hydronephrosis and hydroureter is identified, with minimal  right mid Peri ureteral fat stranding. No radiopaque calculus or other source of obstruction is identified. Findings could reflect obstructing radiolucent calculus, recent passage of a calculus, or infection. Please correlate with urinalysis. The left kidney is unremarkable. Bladder is minimally distended with no gross abnormalities. Stomach/Bowel: No bowel obstruction or ileus. Scattered diverticulosis of the distal colon without evidence of acute diverticulitis. Normal retrocecal appendix. No bowel wall thickening or inflammatory change. Vascular/Lymphatic: Aortic atherosclerosis. No enlarged abdominal or pelvic lymph nodes. Reproductive: Status post hysterectomy. No adnexal masses. Other: No free fluid or free intraperitoneal gas. Small fat containing umbilical and supraumbilical ventral hernias. No bowel herniation or incarceration. Musculoskeletal: No acute or destructive bony lesions. Reconstructed images demonstrate no additional findings. IMPRESSION: 1. Mild right-sided hydronephrosis and hydroureter, without clear cause for obstruction. This most likely reflects recent passage of a calculus or infection. Please correlate with urinalysis. 2. Distal colonic diverticulosis without diverticulitis. 3. 1 cm left adrenal mass, consistent with lipid-rich benign adenoma. No follow-up imaging is recommended. JACR  2017 Aug; 14(8):1038-44, JCAT 2016 Mar-Apr; 40(2):194-200, Urol J 2006 Spring; 3(2):71-4. 4.  Aortic Atherosclerosis (ICD10-I70.0). Electronically Signed   By: Randa Ngo M.D.   On: 08/13/2021 16:33    Microbiology: Results for orders placed or performed during the hospital encounter of 08/13/21  Culture, blood (Routine X 2) w Reflex to ID Panel     Status: None (Preliminary result)   Collection Time: 08/13/21  2:12 PM   Specimen: BLOOD  Result Value Ref Range Status   Specimen Description BLOOD RIGHT ANTECUBITAL  Final   Special Requests   Final    BOTTLES DRAWN AEROBIC AND ANAEROBIC Blood  Culture adequate volume   Culture   Final    NO GROWTH 2 DAYS Performed at [facility], [address], [place], [place] [address]    Report Status PENDING  Incomplete  Urine Culture     Status: Abnormal   Collection Time: 08/13/21  2:12 PM   Specimen: Urine, Clean Catch  Result Value Ref Range Status   Specimen Description   Final    URINE, CLEAN CATCH Performed at [facility], [address] [place], [place] [address]    Special Requests   Final    NONE Performed at [facility], [address], [place], [place] [address]    Culture >=100,000 COLONIES/mL ESCHERICHIA COLI (A)  Final   Report Status 08/15/2021 FINAL  Final   Organism ID, Bacteria ESCHERICHIA COLI (A)  Final      Susceptibility   Escherichia coli - MIC*    AMPICILLIN 4 SENSITIVE Sensitive     CEFAZOLIN <=4 SENSITIVE Sensitive     CEFEPIME <=0.12 SENSITIVE Sensitive     CEFTRIAXONE <=0.25 SENSITIVE Sensitive     CIPROFLOXACIN <=0.25 SENSITIVE Sensitive     GENTAMICIN <=1 SENSITIVE Sensitive     IMIPENEM <=0.25 SENSITIVE Sensitive     NITROFURANTOIN <=16 SENSITIVE Sensitive     TRIMETH/SULFA <=20 SENSITIVE Sensitive     AMPICILLIN/SULBACTAM <=2 SENSITIVE Sensitive     PIP/TAZO <=4 SENSITIVE Sensitive     * >=100,000 COLONIES/mL ESCHERICHIA COLI  Culture, blood (Routine X 2) w Reflex to ID Panel     Status: None (Preliminary result)   Collection Time: 08/13/21  7:15 PM   Specimen: BLOOD  Result Value Ref Range Status   Specimen Description BLOOD LEFT ANTECUBITAL  Final   Special Requests   Final    BOTTLES DRAWN AEROBIC AND ANAEROBIC Blood Culture adequate volume   Culture   Final    NO GROWTH 2 DAYS Performed at [facility], [address] [place], [place] [address]    Report Status PENDING  Incomplete    Labs: CBC: Recent Labs  Lab 08/13/21 1412 08/14/21 0406  WBC 16.0* 10.5  NEUTROABS 13.8*  --   HGB 12.6 10.8*  HCT 40.4 34.4*  MCV 91.6 91.2  PLT  331 194   Basic Metabolic Panel: Recent Labs  Lab 08/13/21 1412 08/14/21 0406  NA 137 142  K 3.4* 3.5  CL 99 108  CO2 26 29  GLUCOSE 157* 108*  BUN 13 10  CREATININE 0.61 0.58  CALCIUM 9.2 8.6*  MG 2.2 2.3   Liver Function Tests: Recent Labs  Lab 08/13/21 1412  AST 19  ALT 17  ALKPHOS 98  BILITOT 0.9  PROT 8.3*  ALBUMIN 4.0   CBG: No results for input(s): GLUCAP in the last 168 hours.  Discharge time spent: greater than 30 minutes.  Signed:  Jennye Boroughs, MD [facility] 08/15/2021

## 2021-08-15 NOTE — TOC Initial Note (Addendum)
Transition of Care [facility]) - Initial/Assessment Note    Patient Details  Name: Kristin Aguirre MRN: [id number] Date of Birth: 1960-12-11  Transition [facility]) CM/SW Contact:    Chapman Fitch, RN Phone Number: 08/15/2021, 12:51 PM  Clinical Narrative:                  Transition of Care (TOC) Screening Note   Patient Details  Name: Kristin Aguirre Date of Birth: 1960/11/26   Transition of Care [facility]) CM/SW Contact:    Chapman Fitch, RN Phone Number: 08/15/2021, 12:51 PM    Transition of Care Department [facility]) has reviewed patient and no TOC needs have been identified at this time. We will continue to monitor patient advancement through interdisciplinary progression rounds. If new patient transition needs arise, please place a TOC consult.          Patient Goals and CMS Choice        Expected Discharge Plan and Services           Expected Discharge Date: 08/15/21                                    Prior Living Arrangements/Services                       Activities of Daily Living Home Assistive Devices/Equipment: None ADL Screening (condition at time of admission) Patient's cognitive ability adequate to safely complete daily activities?: Yes Is the patient deaf or have difficulty hearing?: No Does the patient have difficulty seeing, even when wearing glasses/contacts?: No Does the patient have difficulty concentrating, remembering, or making decisions?: No Patient able to express need for assistance with ADLs?: Yes Does the patient have difficulty dressing or bathing?: No Independently performs ADLs?: Yes (appropriate for developmental age) Does the patient have difficulty walking or climbing stairs?: No Weakness of Legs: None Weakness of Arms/Hands: None  Permission Sought/Granted                  Emotional Assessment              Admission diagnosis:  Pyelonephritis [N12] Sepsis (HCC) [A41.9] Patient Active  Problem List   Diagnosis Date Noted   Hypokalemia 08/14/2021   Sepsis (HCC) 08/13/2021   Acute pyelonephritis 08/13/2021   DOE (dyspnea on exertion) 06/20/2021   Chronic prescription benzodiazepine use 06/20/2021   Chronic, continuous use of opioids 06/20/2021   Screening for colon cancer 06/20/2021   Leg swelling 06/20/2021   COVID-19 12/29/2020   Intractable migraine without aura and without status migrainosus 10/20/2020   Colon cancer screening 10/20/2020   Essential hypertension 05/08/2018   Obesity (BMI 35.0-39.9 without comorbidity) 05/06/2018   Primary osteoarthritis of both knees 04/11/2017   Atypical chest pain 01/31/2015   Baker's cyst of knee 01/31/2015   Chest pressure 01/31/2015   Chronic headache 01/31/2015   Tobacco use 01/31/2015   Contact with and suspected exposure to infections with predominantly sexual mode of transmission 01/31/2015   Postsurgical menopause 01/31/2015   Avitaminosis D 01/31/2015   Low back pain 11/29/2014   Anxiety 08/26/2014   Anxiety, generalized 01/14/2006   Hyperlipidemia 01/14/2006   PCP:  Jacky Kindle, FNP Pharmacy:   [facility], [place] - [address] [place] [address] Phone: [phone] Fax: [phone]     Social Determinants of  Health (SDOH) Interventions    Readmission Risk Interventions     View : No data to display.

## 2021-08-17 ENCOUNTER — Ambulatory Visit
Admission: RE | Admit: 2021-08-17 | Payer: No Typology Code available for payment source | Source: Ambulatory Visit | Admitting: Gastroenterology

## 2021-08-17 ENCOUNTER — Encounter: Payer: Self-pay | Admitting: Registered Nurse

## 2021-08-17 ENCOUNTER — Encounter: Admission: RE | Payer: Self-pay | Source: Ambulatory Visit

## 2021-08-17 SURGERY — COLONOSCOPY WITH PROPOFOL
Anesthesia: General

## 2021-08-18 LAB — CULTURE, BLOOD (ROUTINE X 2)
Culture: NO GROWTH
Culture: NO GROWTH
Special Requests: ADEQUATE
Special Requests: ADEQUATE

## 2021-08-22 ENCOUNTER — Other Ambulatory Visit: Payer: Self-pay

## 2021-08-22 ENCOUNTER — Telehealth: Payer: Self-pay | Admitting: Family Medicine

## 2021-08-22 NOTE — Telephone Encounter (Signed)
[facility] faxed refill request for the following medications:  fluconazole (DIFLUCAN) 150 MG tablet    Please advise.

## 2021-08-22 NOTE — Telephone Encounter (Signed)
Spoke with patient and advised that she needs an appt. Says she will make one at her convenience.

## 2021-08-24 ENCOUNTER — Other Ambulatory Visit: Payer: Self-pay

## 2021-08-24 ENCOUNTER — Emergency Department
Admission: EM | Admit: 2021-08-24 | Discharge: 2021-08-24 | Disposition: A | Discharge status: Home / Self Care | Payer: No Typology Code available for payment source | Attending: Emergency Medicine | Admitting: Emergency Medicine

## 2021-08-24 ENCOUNTER — Encounter: Payer: Self-pay | Admitting: Emergency Medicine

## 2021-08-24 DIAGNOSIS — M545 Low back pain, unspecified: Secondary | ICD-10-CM | POA: Diagnosis not present

## 2021-08-24 DIAGNOSIS — R300 Dysuria: Secondary | ICD-10-CM | POA: Insufficient documentation

## 2021-08-24 DIAGNOSIS — N390 Urinary tract infection, site not specified: Secondary | ICD-10-CM | POA: Diagnosis present

## 2021-08-24 LAB — URINALYSIS, ROUTINE W REFLEX MICROSCOPIC
Bilirubin Urine: NEGATIVE
Glucose, UA: NEGATIVE mg/dL
Hgb urine dipstick: NEGATIVE
Ketones, ur: NEGATIVE mg/dL
Nitrite: NEGATIVE
Protein, ur: 30 mg/dL — AB
Specific Gravity, Urine: 1.019 (ref 1.005–1.030)
pH: 8 (ref 5.0–8.0)

## 2021-08-24 LAB — BASIC METABOLIC PANEL
Anion gap: 7 (ref 5–15)
BUN: 14 mg/dL (ref 8–23)
CO2: 24 mmol/L (ref 22–32)
Calcium: 9 mg/dL (ref 8.9–10.3)
Chloride: 108 mmol/L (ref 98–111)
Creatinine, Ser: 0.59 mg/dL (ref 0.44–1.00)
GFR, Estimated: >60 mL/min (ref >60)
Glucose, Bld: 113 mg/dL — ABNORMAL HIGH (ref 70–99)
Potassium: 3.7 mmol/L (ref 3.5–5.1)
Sodium: 139 mmol/L (ref 135–145)

## 2021-08-24 LAB — CBC
HCT: 37.6 % (ref 36.0–46.0)
Hemoglobin: 12 g/dL (ref 12.0–15.0)
MCH: 28.6 pg (ref 26.0–34.0)
MCHC: 31.9 g/dL (ref 30.0–36.0)
MCV: 89.7 fL (ref 80.0–100.0)
Platelets: 329 10*3/uL (ref 150–400)
RBC: 4.19 MIL/uL (ref 3.87–5.11)
RDW: 12.1 % (ref 11.5–15.5)
WBC: 8.9 10*3/uL (ref 4.0–10.5)
nRBC: 0 % (ref 0.0–0.2)

## 2021-08-24 LAB — WET PREP, GENITAL
Clue Cells Wet Prep HPF POC: NONE SEEN
Sperm: NONE SEEN
Trich, Wet Prep: NONE SEEN
WBC, Wet Prep HPF POC: <10 (ref <10)
Yeast Wet Prep HPF POC: NONE SEEN

## 2021-08-24 MED ORDER — FLUCONAZOLE 150 MG PO TABS: 150.0000 mg | ORAL_TABLET | Freq: Every day | ORAL | 1 refills | Status: DC

## 2021-08-24 MED ORDER — OXYCODONE-ACETAMINOPHEN 7.5-325 MG PO TABS
1.0000 | ORAL_TABLET | Freq: Once | ORAL | Status: AC
  Administered 2021-08-24: 1 via ORAL
  Filled 2021-08-24: qty 1

## 2021-08-24 MED ORDER — METHOCARBAMOL 500 MG PO TABS: ORAL_TABLET | ORAL | 0 refills | Status: DC

## 2021-08-24 MED ORDER — CEFDINIR 300 MG PO CAPS: 300.0000 mg | ORAL_CAPSULE | Freq: Two times a day (BID) | ORAL | 0 refills | Status: AC

## 2021-08-24 MED ORDER — OXYCODONE-ACETAMINOPHEN 7.5-325 MG PO TABS: 1.0000 | ORAL_TABLET | Freq: Four times a day (QID) | ORAL | 0 refills | Status: DC | PRN

## 2021-08-24 NOTE — ED Provider Notes (Signed)
[facility] Provider Note    Event Date/Time   First MD Initiated Contact with Patient 08/24/21 1213     (approximate)   History   Urinary Tract Infection and Back Pain   HPI  Kristin Aguirre is a 61 y.o. female   presents to the ED with complaint of urinary tract symptoms and back pain.  Patient was seen last week for same symptoms and was hospitalized due to sepsis and pyelonephritis.  Patient states that she took all the antibiotics that she was discharged home and yesterday began having similar symptoms with severe back pain.  She denies any fever, chills, nausea or vomiting.  Patient also believes that she has a yeast infection after taking all the antibiotics during her hospitalization.      Physical Exam   Triage Vital Signs: ED Triage Vitals  Enc Vitals Group     BP 08/24/21 1152 (!) 148/90     Pulse Rate 08/24/21 1152 93     Resp 08/24/21 1152 18     Temp 08/24/21 1152 98.2 F (36.8 C)     Temp Source 08/24/21 1152 Oral     SpO2 08/24/21 1152 99 %     Weight 08/24/21 1153 200 lb (90.7 kg)     Height 08/24/21 1153 5\' 9"  (1.753 m)     Head Circumference --      Peak Flow --      Pain Score 08/24/21 1153 10     Pain Loc --      Pain Edu? --      Excl. in GC? --     Most recent vital signs: Vitals:   08/24/21 1152  BP: (!) 148/90  Pulse: 93  Resp: 18  Temp: 98.2 F (36.8 C)  SpO2: 99%     General: Awake, no distress.  CV:  Good peripheral perfusion.  Resp:  Normal effort.  Abd:  No distention.  Soft, nontender, bowel sounds normoactive x4 quadrants. Other:  On examination of the back there is no gross deformity however there is moderate paravertebral muscle tenderness on palpation of the upper lumbar area especially to the right.  Range of motion is decreased secondary to increased pain.  Patient is able to ambulate without any assistance.   ED Results / Procedures / Treatments   Labs (all labs ordered are listed, but only  abnormal results are displayed) Labs Reviewed  BASIC METABOLIC PANEL - Abnormal; Notable for the following components:      Result Value   Glucose, Bld 113 (*)    All other components within normal limits  URINALYSIS, ROUTINE W REFLEX MICROSCOPIC - Abnormal; Notable for the following components:   Color, Urine YELLOW (*)    APPearance HAZY (*)    Protein, ur 30 (*)    Leukocytes,Ua SMALL (*)    Bacteria, UA FEW (*)    All other components within normal limits  WET PREP, GENITAL  URINE CULTURE  CBC       PROCEDURES:  Critical Care performed:   Procedures   MEDICATIONS ORDERED IN ED: Medications  oxyCODONE-acetaminophen (PERCOCET) 7.5-325 MG per tablet 1 tablet (1 tablet Oral Given 08/24/21 1339)     IMPRESSION / MDM / ASSESSMENT AND PLAN / ED COURSE  I reviewed the triage vital signs and the nursing notes.   Differential diagnosis includes, but is not limited to, dysuria, vaginal itching, urinary tract infection, anxiety secondary to recent hospitalization due to sepsis and pyelonephritis.  61 year old female presents to the ED with complaint of dysuria.  Patient was recently hospitalized for pyelonephritis and sepsis.  She is here with urinary symptoms along with concerns for a yeast infection due to the antibiotics that she was take during her hospitalization.  She denies any nausea, vomiting, fever or chills.  Urinalysis was reassuring and showed few bacteria with 6-10 WBCs.  Patient was made aware that her WBC was not elevated at 8.9.  Wet prep was negative for yeast, trichomoniasis, clue cells and WBCs.  BMP was normal with the exception of a nonfasting glucose at 113.  A culture was ordered on this urine specimen.  In reviewing her records from her hospitalization she grew out greater than 100,000 E. coli and was sensitive to every antibiotic that was tested.  While in the emergency department patient was given Percocet 7.5 and got relief of her back pain.  Is felt that her  back pain this time is more muscle skeletal than it is related to a urinary tract infection.  Patient was given a prescription for Omnicef 300 mg twice daily, Diflucan 150 mg, methocarbamol 500 mg and Percocet 7.5 mg every 6 hours as needed for pain.  Patient is to follow-up with her PCP if any continued problems.  Also she is aware that she should return to the emergency department if any severe worsening of her symptoms especially if she begins having fever, chills, nausea or vomiting for reevaluation.  Patient was improved prior to discharge and was continuing to be ambulatory without any assistance.      Patient's presentation is most consistent with acute complicated illness / injury requiring diagnostic workup.  FINAL CLINICAL IMPRESSION(S) / ED DIAGNOSES   Final diagnoses:  Dysuria     Rx / DC Orders   ED Discharge Orders          Ordered    oxyCODONE-acetaminophen (PERCOCET) 7.5-325 MG tablet  Every 6 hours PRN        08/24/21 1457    methocarbamol (ROBAXIN) 500 MG tablet        08/24/21 1457    fluconazole (DIFLUCAN) 150 MG tablet  Daily        08/24/21 1459    cefdinir (OMNICEF) 300 MG capsule  2 times daily        08/24/21 1500             Note:  This document was prepared using Dragon voice recognition software and may include unintentional dictation errors.   Johnn Hai, PA-C 08/24/21 1633    Duffy Bruce, MD 08/27/21 [phone]

## 2021-08-24 NOTE — ED Notes (Signed)
See triage note  states she was recently admitted to hospital with uti/sepsis  states she was discharged on antibiotics  finished this round 2 days ago  then developed lower back pain and fever yesterday  pt is currently afebrile on arrival

## 2021-08-24 NOTE — ED Notes (Signed)
DC ppw provided to patient. Pt questions, followup and rx information reviewed. Pt decline vs at time of dc and sent to lobby alert and oriented at this time 

## 2021-08-24 NOTE — Discharge Instructions (Addendum)
Call make an appointment with your primary care provider if any continued problems.  Increase fluids to stay hydrated.  Prescriptions for an antibiotic was sent to the pharmacy for you to take for the next 7 days along with Diflucan if you develop a yeast infection.  Also noting how you are moving a muscle relaxant was sent for muscle spasms if needed and also oxycodone 7.5 mg 1 every 6 hours as needed for severe pain.  If any severe worsening of your symptoms such as nausea, vomiting or fever and chills return to the emergency department over the weekend.

## 2021-08-24 NOTE — ED Triage Notes (Signed)
Pt here with urinary tract infection symptoms and back pain. Pt was just seen last week for the same sx and given abx, pt completed the entire course of abx. Pt states yesterday she began having the same symptoms with severe back pain.

## 2021-08-25 LAB — URINE CULTURE

## 2021-09-07 IMAGING — MG MM DIGITAL SCREENING BILAT W/ TOMO AND CAD
8 series · 8 of 24 positions shown · non-contrast
Comparison: Previous exam(s).

CLINICAL DATA: Screening.

EXAM:
DIGITAL SCREENING BILATERAL MAMMOGRAM WITH TOMOSYNTHESIS AND CAD
TECHNIQUE: Bilateral screening digital craniocaudal and mediolateral oblique
mammograms were obtained. Bilateral screening digital breast
tomosynthesis was performed. The images were evaluated with
computer-aided detection.

[L CC synth-2D]
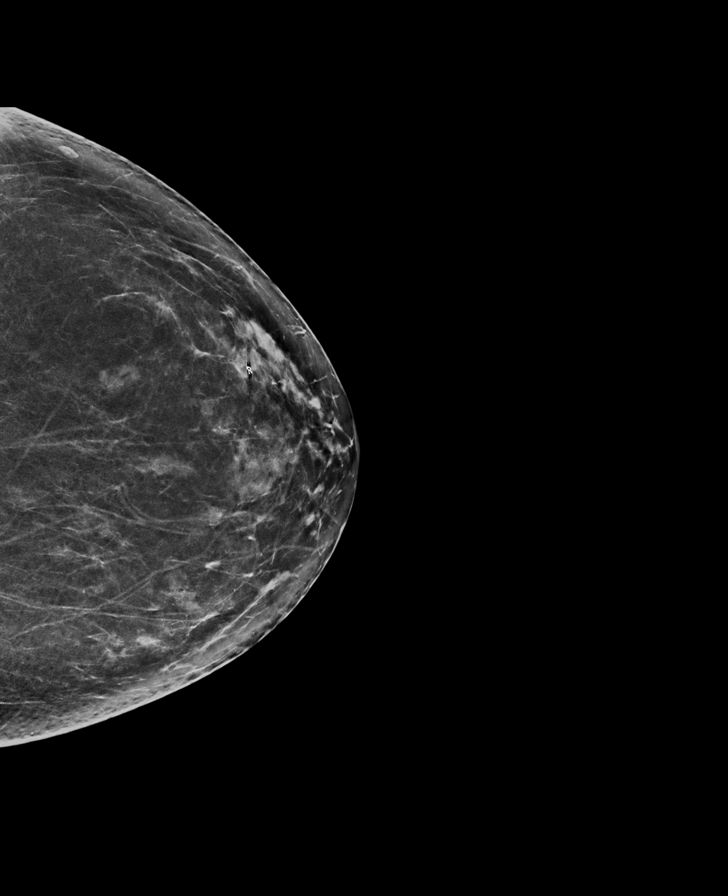

[R CC synth-2D]
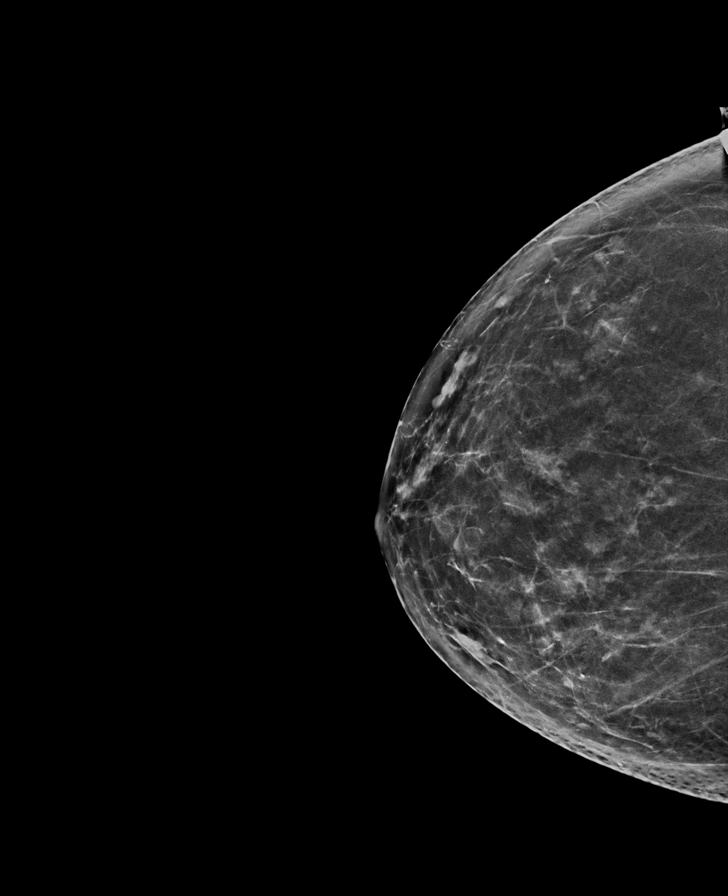

[L MLO synth-2D]
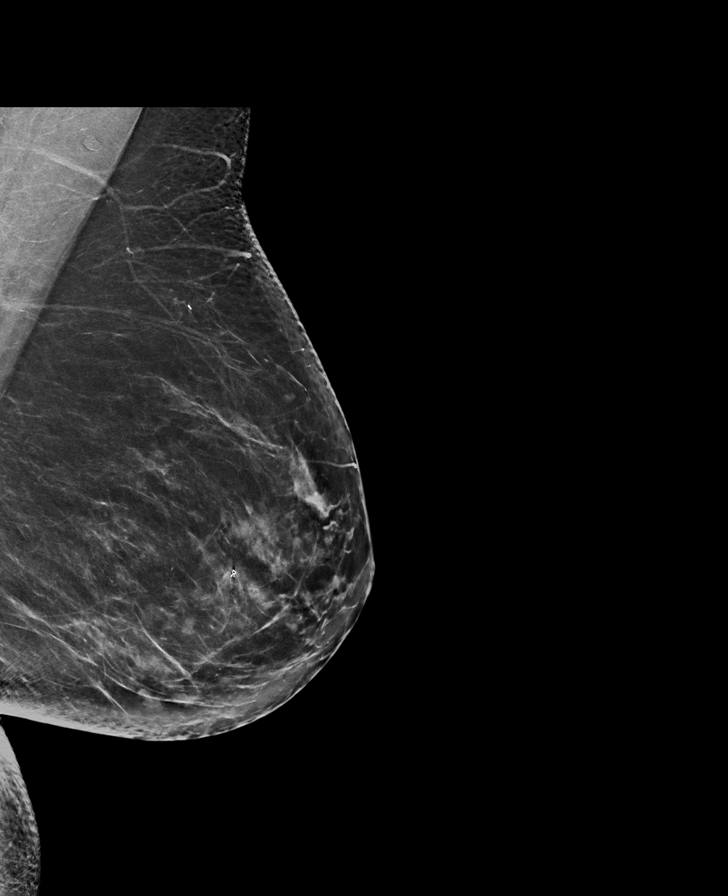

[R MLO synth-2D]
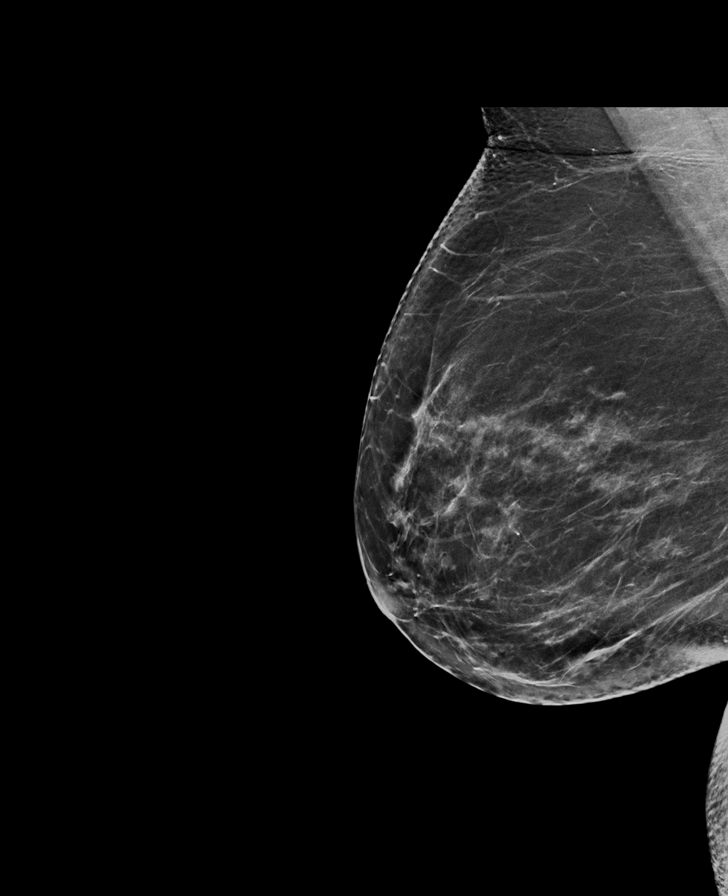

[L CC tomo · tomo slice 35/70.0]
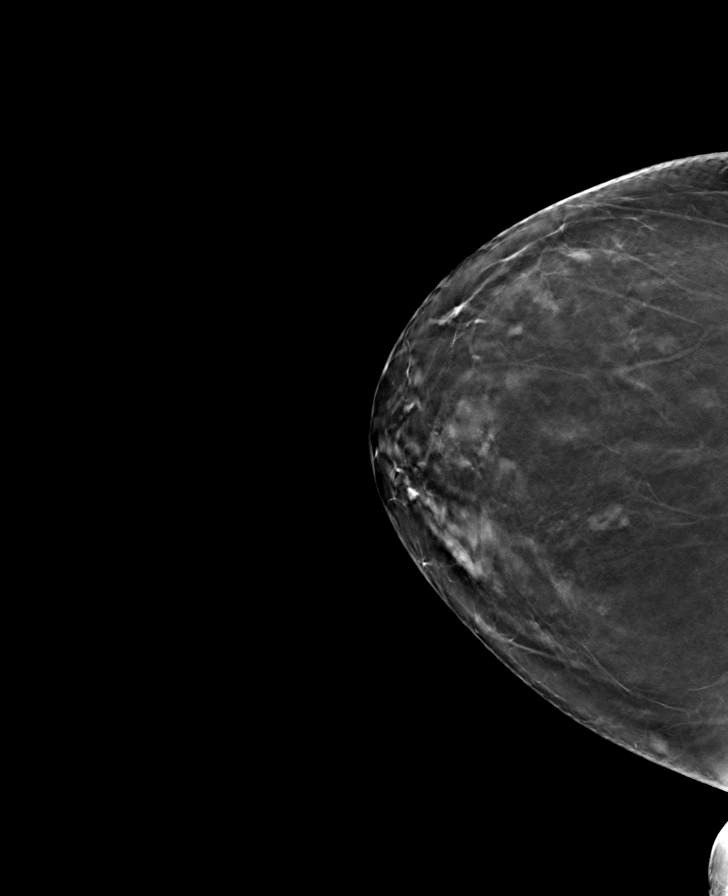

[R CC tomo · tomo slice 34/67.0]
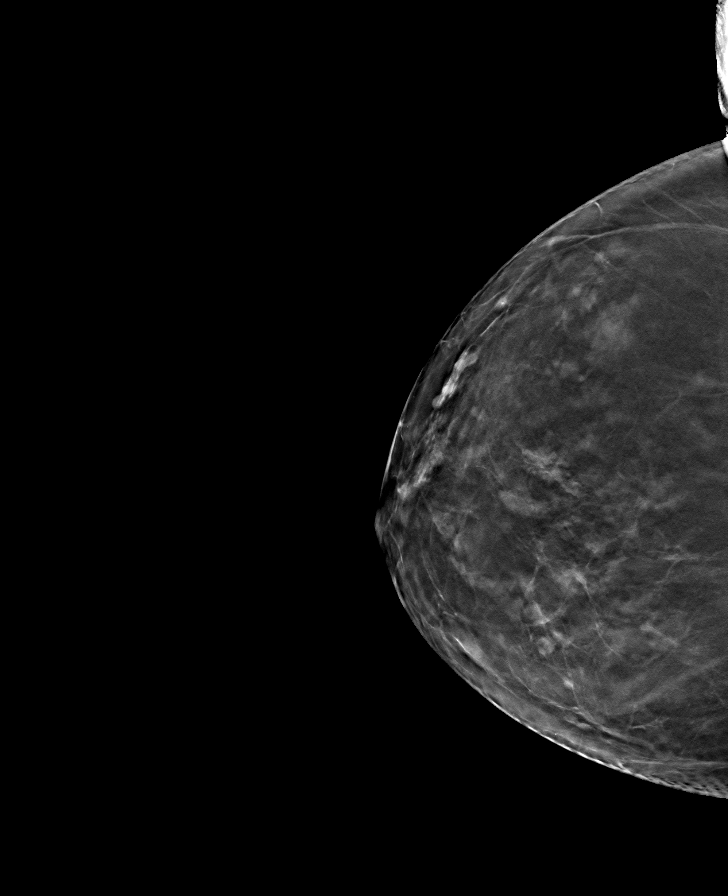

[R MLO tomo · tomo slice 39/77.0]
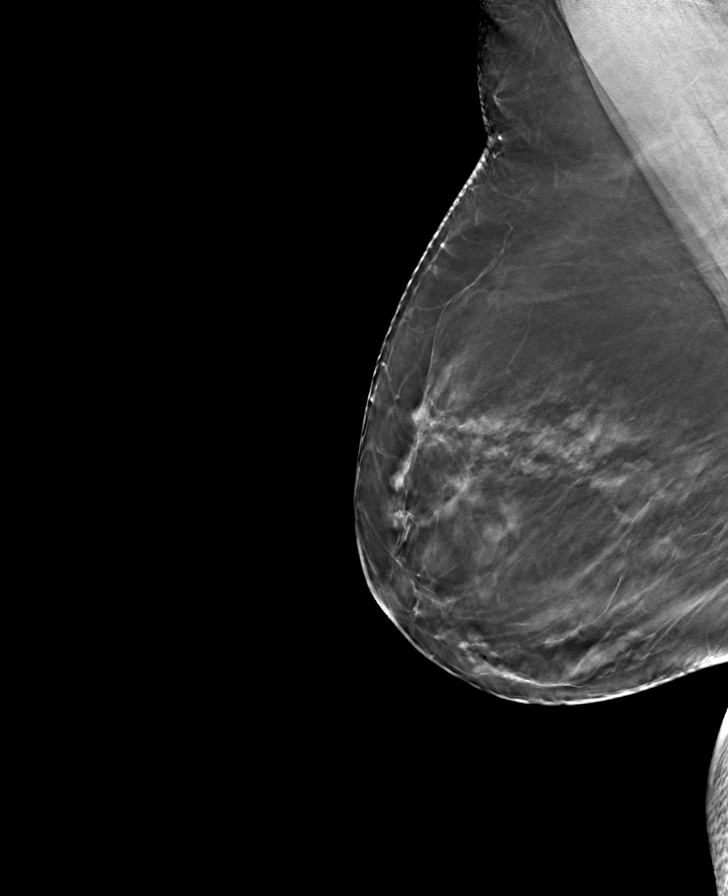

[L MLO tomo · tomo slice 39/78.0]
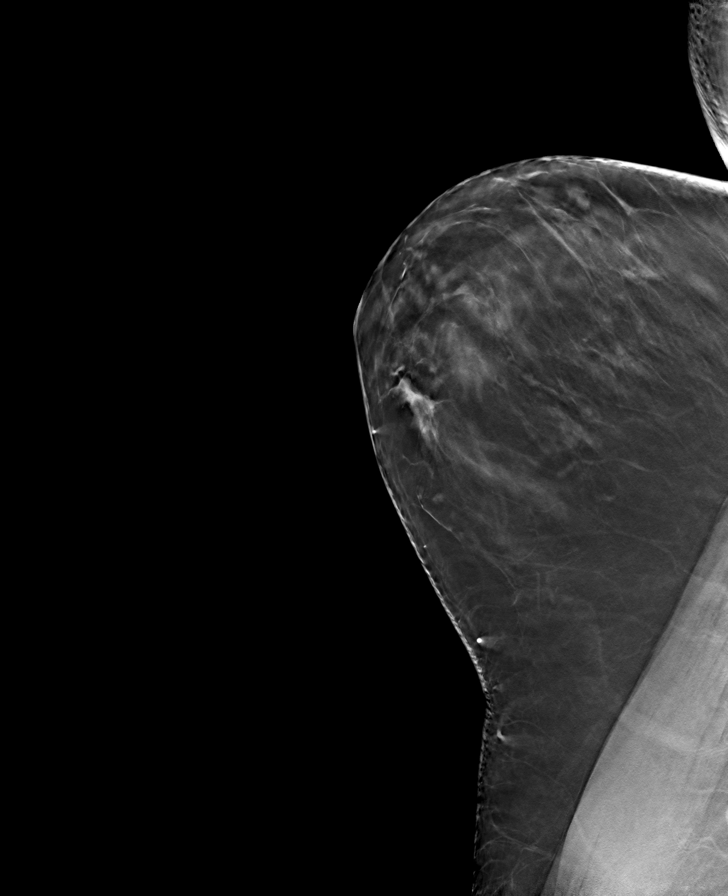

[8 of 24 positions shown; findings below may reference images not displayed]

ACR Breast Density Category b: There are scattered areas of
fibroglandular density.
FINDINGS: There are no findings suspicious for malignancy. There are multiple
bilateral round and oval circumscribed masses, most consistent with
benign fibroadenomas or cysts. The images were evaluated with
computer-aided detection.
IMPRESSION: No mammographic evidence of malignancy. A result letter of this
screening mammogram will be mailed directly to the patient.

RECOMMENDATION:
Screening mammogram in one year. (Code:[id number]-Z-20L)

BI-RADS CATEGORY  2: Benign.

## 2021-11-28 ENCOUNTER — Encounter: Payer: Self-pay | Admitting: Family Medicine

## 2021-12-03 ENCOUNTER — Telehealth (INDEPENDENT_AMBULATORY_CARE_PROVIDER_SITE_OTHER): Payer: Self-pay | Admitting: Family Medicine

## 2021-12-03 ENCOUNTER — Encounter: Payer: Self-pay | Admitting: Family Medicine

## 2021-12-03 VITALS — Ht 69.0 in | Wt 200.0 lb

## 2021-12-03 DIAGNOSIS — R0683 Snoring: Secondary | ICD-10-CM | POA: Insufficient documentation

## 2021-12-03 MED ORDER — OXYCODONE-ACETAMINOPHEN 7.5-325 MG PO TABS: 1.0000 | ORAL_TABLET | Freq: Four times a day (QID) | ORAL | 0 refills | Status: DC | PRN

## 2021-12-03 NOTE — Assessment & Plan Note (Addendum)
With witnessed apneic episodes by partner, daytime hypersomnolence. Likely contributing to h/o HTN. Will refer to sleep studies.

## 2021-12-03 NOTE — Progress Notes (Signed)
Virtual Visit via Video Note  I connected with Marzell Allemand Bogert on 12/03/21 at 11:40 AM EDT by a video enabled telemedicine application and verified that I am speaking with the correct person using two identifiers.  Location: Patient: home Provider: [facility]   I discussed the limitations of evaluation and management by telemedicine and the availability of in person appointments. The patient expressed understanding and agreed to proceed.  History of Present Illness:  Sleep study - snores with witnessed apneic episodes by partner. - worse with weight gain over the last few years.  - tired in the morning.  - never had sleep study.  - falls asleep easily during the day. - has [employer]  Hypertension: - Medications: HCTZ, lisinopril, propranolol - Compliance: good - Checking BP at home: yes, unsure of numbers - Denies any SOB, CP, vision changes, LE edema, medication SEs, or symptoms of hypotension    Observations/Objective:  Well appearing, in NAD. Speaks in full sentences. Comfortable WOB on RA. No resp distress.    Assessment and Plan:  Snoring With witnessed apneic episodes by partner, daytime hypersomnolence. Likely contributing to h/o HTN. Will refer to sleep studies.   Make appointment soon to f/u on chronic disease.     I discussed the assessment and treatment plan with the patient. The patient was provided an opportunity to ask questions and all were answered. The patient agreed with the plan and demonstrated an understanding of the instructions.   The patient was advised to call back or seek an in-person evaluation if the symptoms worsen or if the condition fails to improve as anticipated.  I provided 7 minutes of non-face-to-face time during this encounter.   Myles Gip, DO

## 2021-12-07 ENCOUNTER — Encounter: Payer: Self-pay | Admitting: Family Medicine

## 2021-12-13 ENCOUNTER — Emergency Department (HOSPITAL_COMMUNITY): Payer: 59

## 2021-12-13 ENCOUNTER — Observation Stay (HOSPITAL_COMMUNITY)
Admission: EM | Admit: 2021-12-13 | Discharge: 2021-12-17 | Disposition: A | Discharge status: Home / Self Care | Payer: 59 | Attending: Family Medicine | Admitting: Family Medicine

## 2021-12-13 DIAGNOSIS — F32A Depression, unspecified: Secondary | ICD-10-CM | POA: Diagnosis present

## 2021-12-13 DIAGNOSIS — F411 Generalized anxiety disorder: Secondary | ICD-10-CM | POA: Diagnosis present

## 2021-12-13 DIAGNOSIS — Y929 Unspecified place or not applicable: Secondary | ICD-10-CM

## 2021-12-13 DIAGNOSIS — T402X5A Adverse effect of other opioids, initial encounter: Secondary | ICD-10-CM | POA: Diagnosis present

## 2021-12-13 DIAGNOSIS — G43019 Migraine without aura, intractable, without status migrainosus: Secondary | ICD-10-CM

## 2021-12-13 DIAGNOSIS — E785 Hyperlipidemia, unspecified: Secondary | ICD-10-CM | POA: Diagnosis present

## 2021-12-13 DIAGNOSIS — Z23 Encounter for immunization: Secondary | ICD-10-CM | POA: Diagnosis not present

## 2021-12-13 DIAGNOSIS — Z1152 Encounter for screening for COVID-19: Secondary | ICD-10-CM | POA: Diagnosis not present

## 2021-12-13 DIAGNOSIS — R55 Syncope and collapse: Secondary | ICD-10-CM | POA: Diagnosis present

## 2021-12-13 DIAGNOSIS — Z803 Family history of malignant neoplasm of breast: Secondary | ICD-10-CM

## 2021-12-13 DIAGNOSIS — G8929 Other chronic pain: Secondary | ICD-10-CM | POA: Insufficient documentation

## 2021-12-13 DIAGNOSIS — K219 Gastro-esophageal reflux disease without esophagitis: Secondary | ICD-10-CM | POA: Diagnosis not present

## 2021-12-13 DIAGNOSIS — I10 Essential (primary) hypertension: Secondary | ICD-10-CM | POA: Diagnosis not present

## 2021-12-13 DIAGNOSIS — Z6834 Body mass index (BMI) 34.0-34.9, adult: Secondary | ICD-10-CM | POA: Diagnosis not present

## 2021-12-13 DIAGNOSIS — Z90710 Acquired absence of both cervix and uterus: Secondary | ICD-10-CM

## 2021-12-13 DIAGNOSIS — E669 Obesity, unspecified: Secondary | ICD-10-CM | POA: Diagnosis not present

## 2021-12-13 DIAGNOSIS — Z8249 Family history of ischemic heart disease and other diseases of the circulatory system: Secondary | ICD-10-CM

## 2021-12-13 DIAGNOSIS — Z79891 Long term (current) use of opiate analgesic: Secondary | ICD-10-CM | POA: Diagnosis not present

## 2021-12-13 DIAGNOSIS — M069 Rheumatoid arthritis, unspecified: Secondary | ICD-10-CM | POA: Diagnosis not present

## 2021-12-13 DIAGNOSIS — R1110 Vomiting, unspecified: Secondary | ICD-10-CM

## 2021-12-13 DIAGNOSIS — F17290 Nicotine dependence, other tobacco product, uncomplicated: Secondary | ICD-10-CM | POA: Insufficient documentation

## 2021-12-13 DIAGNOSIS — Z713 Dietary counseling and surveillance: Secondary | ICD-10-CM

## 2021-12-13 DIAGNOSIS — R7989 Other specified abnormal findings of blood chemistry: Secondary | ICD-10-CM | POA: Diagnosis present

## 2021-12-13 DIAGNOSIS — T424X5A Adverse effect of benzodiazepines, initial encounter: Secondary | ICD-10-CM | POA: Diagnosis present

## 2021-12-13 DIAGNOSIS — Z888 Allergy status to other drugs, medicaments and biological substances status: Secondary | ICD-10-CM

## 2021-12-13 DIAGNOSIS — D649 Anemia, unspecified: Secondary | ICD-10-CM | POA: Insufficient documentation

## 2021-12-13 DIAGNOSIS — G4730 Sleep apnea, unspecified: Secondary | ICD-10-CM | POA: Diagnosis not present

## 2021-12-13 DIAGNOSIS — Z833 Family history of diabetes mellitus: Secondary | ICD-10-CM

## 2021-12-13 DIAGNOSIS — Z825 Family history of asthma and other chronic lower respiratory diseases: Secondary | ICD-10-CM

## 2021-12-13 DIAGNOSIS — Z79899 Other long term (current) drug therapy: Secondary | ICD-10-CM

## 2021-12-13 DIAGNOSIS — J189 Pneumonia, unspecified organism: Secondary | ICD-10-CM

## 2021-12-13 DIAGNOSIS — M1710 Unilateral primary osteoarthritis, unspecified knee: Secondary | ICD-10-CM | POA: Diagnosis present

## 2021-12-13 DIAGNOSIS — Z7982 Long term (current) use of aspirin: Secondary | ICD-10-CM

## 2021-12-13 DIAGNOSIS — Z823 Family history of stroke: Secondary | ICD-10-CM

## 2021-12-13 LAB — CBC WITH DIFFERENTIAL/PLATELET
Abs Immature Granulocytes: 0.05 10*3/uL (ref 0.00–0.07)
Basophils Absolute: 0.1 10*3/uL (ref 0.0–0.1)
Basophils Relative: 1 %
Eosinophils Absolute: 0.2 10*3/uL (ref 0.0–0.5)
Eosinophils Relative: 2 %
HCT: 35.5 % — ABNORMAL LOW (ref 36.0–46.0)
Hemoglobin: 11.3 g/dL — ABNORMAL LOW (ref 12.0–15.0)
Immature Granulocytes: 1 %
Lymphocytes Relative: 18 %
Lymphs Abs: 1.7 10*3/uL (ref 0.7–4.0)
MCH: 28.9 pg (ref 26.0–34.0)
MCHC: 31.8 g/dL (ref 30.0–36.0)
MCV: 90.8 fL (ref 80.0–100.0)
Monocytes Absolute: 0.5 10*3/uL (ref 0.1–1.0)
Monocytes Relative: 5 %
Neutro Abs: 7.2 10*3/uL (ref 1.7–7.7)
Neutrophils Relative %: 73 %
Platelets: 254 10*3/uL (ref 150–400)
RBC: 3.91 MIL/uL (ref 3.87–5.11)
RDW: 12.7 % (ref 11.5–15.5)
WBC: 9.8 10*3/uL (ref 4.0–10.5)
nRBC: 0 % (ref 0.0–0.2)

## 2021-12-13 LAB — URINALYSIS, ROUTINE W REFLEX MICROSCOPIC
Bilirubin Urine: NEGATIVE
Glucose, UA: NEGATIVE mg/dL
Hgb urine dipstick: NEGATIVE
Ketones, ur: NEGATIVE mg/dL
Leukocytes,Ua: NEGATIVE
Nitrite: NEGATIVE
Protein, ur: NEGATIVE mg/dL
Specific Gravity, Urine: 1.014 (ref 1.005–1.030)
pH: 7 (ref 5.0–8.0)

## 2021-12-13 LAB — COMPREHENSIVE METABOLIC PANEL
ALT: 18 U/L (ref 0–44)
AST: 18 U/L (ref 15–41)
Albumin: 3.8 g/dL (ref 3.5–5.0)
Alkaline Phosphatase: 70 U/L (ref 38–126)
Anion gap: 12 (ref 5–15)
BUN: 18 mg/dL (ref 8–23)
CO2: 24 mmol/L (ref 22–32)
Calcium: 9.1 mg/dL (ref 8.9–10.3)
Chloride: 104 mmol/L (ref 98–111)
Creatinine, Ser: 0.59 mg/dL (ref 0.44–1.00)
GFR, Estimated: >60 mL/min (ref >60)
Glucose, Bld: 106 mg/dL — ABNORMAL HIGH (ref 70–99)
Potassium: 4.4 mmol/L (ref 3.5–5.1)
Sodium: 140 mmol/L (ref 135–145)
Total Bilirubin: 0.2 mg/dL — ABNORMAL LOW (ref 0.3–1.2)
Total Protein: 7.4 g/dL (ref 6.5–8.1)

## 2021-12-13 LAB — RAPID URINE DRUG SCREEN, HOSP PERFORMED
Amphetamines: NOT DETECTED
Barbiturates: NOT DETECTED
Benzodiazepines: NOT DETECTED
Cocaine: NOT DETECTED
Opiates: NOT DETECTED
Tetrahydrocannabinol: NOT DETECTED

## 2021-12-13 LAB — LACTIC ACID, PLASMA: Lactic Acid, Venous: 1.6 mmol/L (ref 0.5–1.9)

## 2021-12-13 LAB — SARS CORONAVIRUS 2 BY RT PCR: SARS Coronavirus 2 by RT PCR: NEGATIVE

## 2021-12-13 LAB — D-DIMER, QUANTITATIVE: D-Dimer, Quant: 0.68 ug/mL-FEU — ABNORMAL HIGH (ref 0.00–0.50)

## 2021-12-13 LAB — TROPONIN I (HIGH SENSITIVITY): Troponin I (High Sensitivity): 15 ng/L (ref <18)

## 2021-12-13 LAB — LIPASE, BLOOD: Lipase: 29 U/L (ref 11–51)

## 2021-12-13 MED ORDER — SODIUM CHLORIDE 0.9 % IV SOLN
1.0000 g | INTRAVENOUS | Status: DC
  Administered 2021-12-13 – 2021-12-16 (×5): 1 g via INTRAVENOUS
  Filled 2021-12-13 (×4): qty 10

## 2021-12-13 MED ORDER — IOHEXOL 350 MG/ML SOLN
80.0000 mL | Freq: Once | INTRAVENOUS | Status: AC | PRN
  Administered 2021-12-13: 80 mL via INTRAVENOUS

## 2021-12-13 MED ORDER — LACTATED RINGERS IV BOLUS
1000.0000 mL | Freq: Once | INTRAVENOUS | Status: AC
  Administered 2021-12-13: 1000 mL via INTRAVENOUS

## 2021-12-13 MED ORDER — ONDANSETRON HCL 4 MG/2ML IJ SOLN
4.0000 mg | Freq: Once | INTRAMUSCULAR | Status: AC
  Administered 2021-12-13: 4 mg via INTRAVENOUS
  Filled 2021-12-13: qty 2

## 2021-12-13 MED ORDER — ACETAMINOPHEN 325 MG PO TABS
650.0000 mg | ORAL_TABLET | Freq: Four times a day (QID) | ORAL | Status: DC | PRN
  Administered 2021-12-14: 650 mg via ORAL
  Filled 2021-12-13: qty 2

## 2021-12-13 MED ORDER — SODIUM CHLORIDE 0.9 % IV SOLN: 500.0000 mg | INTRAVENOUS | Status: DC

## 2021-12-13 MED ORDER — SODIUM CHLORIDE 0.9 % IV SOLN
100.0000 mg | Freq: Two times a day (BID) | INTRAVENOUS | Status: DC
  Administered 2021-12-14 – 2021-12-15 (×3): 100 mg via INTRAVENOUS
  Filled 2021-12-13 (×4): qty 100

## 2021-12-13 MED ORDER — ENOXAPARIN SODIUM 40 MG/0.4ML IJ SOSY
40.0000 mg | PREFILLED_SYRINGE | Freq: Every day | INTRAMUSCULAR | Status: DC
  Administered 2021-12-13 – 2021-12-16 (×4): 40 mg via SUBCUTANEOUS
  Filled 2021-12-13 (×4): qty 0.4

## 2021-12-13 MED ORDER — KETOROLAC TROMETHAMINE 15 MG/ML IJ SOLN
15.0000 mg | Freq: Once | INTRAMUSCULAR | Status: AC
  Administered 2021-12-13: 15 mg via INTRAVENOUS
  Filled 2021-12-13: qty 1

## 2021-12-13 MED ORDER — ACETAMINOPHEN 650 MG RE SUPP: 650.0000 mg | Freq: Four times a day (QID) | RECTAL | Status: DC | PRN

## 2021-12-13 NOTE — H&P (Signed)
History and Physical    Kristin Aguirre [id number] DOB: Jul 19, 1960 DOA: 12/13/2021  PCP: [facility]  Patient coming from: Home  Chief Complaint: Syncope  HPI: Kristin Aguirre is a 61 y.o. female with medical history significant of anxiety, hypertension, hyperlipidemia, low back pain/osteoarthritis on oxycodone, GERD, migraine presented to the ED via EMS for evaluation of loss of consciousness.  Reportedly patient had an episode of loss of consciousness at work.  She became unresponsive while sitting in her chair and bystanders performed CPR.  On fire department arrival, she was cyanotic and was given several rescue breaths by bag mask ventilation.  Unclear whether she ever lost pulses.  On EMS arrival, patient was awake and breathing spontaneously, following commands.  Did not require administration of Narcan.  No tongue biting, incontinence, or seizure-like activity reported.  Vital signs on arrival to the ED: Temperature 98.3 F, heart rate 98, respiratory rate 22, blood pressure 157/93, SPO2 99% on room air.  Labs showing no leukocytosis, hemoglobin 11.3 (no significant change from baseline), glucose 106, no elevation of LFTs or lipase, initial high-sensitivity troponin negative with repeat pending, D-dimer 0.68, lactic acid normal.  CT head negative for acute finding.  CTA chest negative for PE but does show multifocal groundglass pulmonary infiltrates, more focally within the left lower lobe, possibly infectious or inflammatory in nature as can be seen with atypical infection. Patient was given Toradol, Zofran, and 2 L LR boluses.  Patient states her coworker brought her hamburger, fries, and chicken nuggets for lunch.  After eating, while she was still sitting in a chair, she felt dizzy and felt the room spinning around her.  She then vomited and does not remember what happened afterwards.  She remembers waking up with EMS around her.  States she has not been feeling well for the  past few days, having generalized weakness/fatigue, cough, and shortness of breath.  Denies fevers or chest pain.  Denies any episodes of vomiting prior to today.  Denies abdominal pain.  She does recall having an episode of diarrhea/loose stool yesterday but no recurrence since then.  Denies laxative use.  Reports having a regular bowel movement today.  Patient states she takes 1 tablet of oxycodone twice a day for chronic pain related to knee osteoarthritis and last dose was yesterday night.  She denies taking any extra pills of oxycodone.  Denies alcohol or any other drug use.  Denies history of seizures.  States she was told by her boyfriend that she snores loudly at night and sometimes stops breathing.  She is supposed to get a sleep study done soon.  Review of Systems:  Review of Systems  All other systems reviewed and are negative.   Past Medical History:  Diagnosis Date   Allergy    Anxiety    Diffuse cystic mastopathy    LEFT SIDE   GERD (gastroesophageal reflux disease)    High cholesterol    PONV (postoperative nausea and vomiting)    Sepsis ([place]) 08/13/2021    Past Surgical History:  Procedure Laterality Date   ABDOMINAL HYSTERECTOMY  2003   BREAST BIOPSY Left 2011, 2015   neg   BREAST BIOPSY Left 07/17/2009   Vacuum biopsy, 4:00 position: 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   cervical spine repair      CESAREAN SECTION     TONSILLECTOMY       reports that she quit smoking about 4 months  ago. Her smoking use included cigarettes. She started smoking about 9 years ago. She smoked an average of .25 packs per day. She has never used smokeless tobacco. She reports current alcohol use of about 5.0 standard drinks of alcohol per week. She reports that she does not use drugs.  Allergies  Allergen Reactions   Furosemide Rash    Family History  Problem Relation Age of Onset   Heart  block Mother    Cancer Mother        on leg   Diabetes Mother    Heart disease Mother    Diabetes Father    Heart disease Father    CVA Father    Asthma Sister    Anemia Sister    Seizures Brother    Breast cancer Maternal Grandmother 17    Prior to Admission medications   Medication Sig Start Date End Date Taking? Authorizing Provider  albuterol (VENTOLIN HFA) 108 (90 Base) MCG/ACT inhaler Inhale 1-2 puffs into the lungs every 6 (six) hours as needed (cough). 01/12/21   Boddu, Erasmo Downer, FNP  ALPRAZolam Duanne Moron) 0.5 MG tablet Take 1 tablet (0.5 mg total) by mouth 2 (two) times daily as needed. Due for 6 month controlled substance office visit. Please call and schedule an appointment. Patient not taking: Reported on 12/03/2021 06/05/21   Tally Joe T, FNP  Ascorbic Acid (VITAMIN C PO) Take 1 tablet by mouth daily at 12 noon. 04/04/14   [provider]  aspirin EC 81 MG tablet Take 81 mg by mouth daily. Swallow whole.    [provider]  citalopram (CELEXA) 40 MG tablet Take 1 tablet (40 mg total) by mouth at bedtime. 10/20/20   Gwyneth Sprout, FNP  fluticasone (FLONASE) 50 MCG/ACT nasal spray Place 2 sprays into both nostrils daily. Patient taking differently: Place 2 sprays into both nostrils as needed. 10/20/20   Gwyneth Sprout, FNP  hydrochlorothiazide (HYDRODIURIL) 25 MG tablet Take 1 tablet (25 mg total) by mouth daily. 06/20/21   Gwyneth Sprout, FNP  lisinopril (ZESTRIL) 10 MG tablet Take 1 tablet (10 mg total) by mouth daily. 10/20/20   Gwyneth Sprout, FNP  Multiple Vitamins-Minerals (ALIVE ONCE DAILY WOMENS PO) Take 2 tablets by mouth daily at 12 noon. gummies    [provider]  oxyCODONE-acetaminophen (PERCOCET) 7.5-325 MG tablet Take 1 tablet by mouth every 6 (six) hours as needed for severe pain. 12/03/21 12/03/22  Myles Gip, DO  pantoprazole (PROTONIX) 40 MG tablet TAKE 1 TABLET BY MOUTH ONCE DAILY Patient taking differently: Take 40 mg by mouth daily at  12 noon. 02/22/20   Mar Daring, PA-C  propranolol (INDERAL) 20 MG tablet Take 1 tablet (20 mg total) by mouth 2 (two) times daily. 06/20/21   Gwyneth Sprout, FNP  rosuvastatin (CRESTOR) 10 MG tablet Take 1 tablet (10 mg total) by mouth daily. 10/23/20   Gwyneth Sprout, FNP  SUMAtriptan (IMITREX) 50 MG tablet Take 1 tablet (50 mg total) by mouth every 2 (two) hours as needed for migraine (2 doses max per day). May repeat in 2 hours if headache persists or recurs. Patient not taking: Reported on 12/03/2021 06/20/21   Gwyneth Sprout, FNP    Physical Exam: Vitals:   12/13/21 2000 12/13/21 2015 12/13/21 2030 12/13/21 2045  BP: (!) 160/86 (!) 141/77 (!) 155/86 (!) 147/91  Pulse: 90 93 87 (!) 101  Resp: 14 17 14 15   Temp:      TempSrc:  SpO2: 97% 98% 98% 97%  Weight:      Height:        Physical Exam Vitals reviewed.  Constitutional:      General: She is not in acute distress. HENT:     Head: Normocephalic and atraumatic.  Eyes:     Extraocular Movements: Extraocular movements intact.  Cardiovascular:     Rate and Rhythm: Normal rate and regular rhythm.     Pulses: Normal pulses.  Pulmonary:     Effort: Pulmonary effort is normal. No respiratory distress.     Breath sounds: Normal breath sounds.  Abdominal:     General: Bowel sounds are normal. There is no distension.     Palpations: Abdomen is soft.     Tenderness: There is no abdominal tenderness.  Musculoskeletal:        General: No swelling or tenderness.     Cervical back: Normal range of motion.  Skin:    General: Skin is warm and dry.  Neurological:     General: No focal deficit present.     Mental Status: She is alert and oriented to person, place, and time.      Labs on Admission: I have personally reviewed following labs and imaging studies  CBC: Recent Labs  Lab 12/13/21 1811  WBC 9.8  NEUTROABS 7.2  HGB 11.3*  HCT 35.5*  MCV 90.8  PLT 254   Basic Metabolic Panel: Recent Labs  Lab  12/13/21 1811  NA 140  K 4.4  CL 104  CO2 24  GLUCOSE 106*  BUN 18  CREATININE 0.59  CALCIUM 9.1   GFR: Estimated Creatinine Clearance: 87.5 mL/min (by C-G formula based on SCr of 0.59 mg/dL). Liver Function Tests: Recent Labs  Lab 12/13/21 1811  AST 18  ALT 18  ALKPHOS 70  BILITOT 0.2*  PROT 7.4  ALBUMIN 3.8   Recent Labs  Lab 12/13/21 1811  LIPASE 29   No results for input(s): "AMMONIA" in the last 168 hours. Coagulation Profile: No results for input(s): "INR", "PROTIME" in the last 168 hours. Cardiac Enzymes: No results for input(s): "CKTOTAL", "CKMB", "CKMBINDEX", "TROPONINI" in the last 168 hours. BNP (last 3 results) No results for input(s): "PROBNP" in the last 8760 hours. HbA1C: No results for input(s): "HGBA1C" in the last 72 hours. CBG: No results for input(s): "GLUCAP" in the last 168 hours. Lipid Profile: No results for input(s): "CHOL", "HDL", "LDLCALC", "TRIG", "CHOLHDL", "LDLDIRECT" in the last 72 hours. Thyroid Function Tests: No results for input(s): "TSH", "T4TOTAL", "FREET4", "T3FREE", "THYROIDAB" in the last 72 hours. Anemia Panel: No results for input(s): "VITAMINB12", "FOLATE", "FERRITIN", "TIBC", "IRON", "RETICCTPCT" in the last 72 hours. Urine analysis:    Component Value Date/Time   COLORURINE YELLOW (A) 08/24/2021 1156   APPEARANCEUR HAZY (A) 08/24/2021 1156   LABSPEC 1.019 08/24/2021 1156   PHURINE 8.0 08/24/2021 1156   GLUCOSEU NEGATIVE 08/24/2021 1156   HGBUR NEGATIVE 08/24/2021 1156   BILIRUBINUR NEGATIVE 08/24/2021 1156   KETONESUR NEGATIVE 08/24/2021 1156   PROTEINUR 30 (A) 08/24/2021 1156   NITRITE NEGATIVE 08/24/2021 1156   LEUKOCYTESUR SMALL (A) 08/24/2021 1156    Radiological Exams on Admission: CT Angio Chest PE W and/or Wo Contrast  Result Date: 12/13/2021 CLINICAL DATA:  Pulmonary embolism (PE) suspected, positive D-dimer. Cardiopulmonary arrest EXAM: CT ANGIOGRAPHY CHEST WITH CONTRAST TECHNIQUE: Multidetector CT  imaging of the chest was performed using the standard protocol during bolus administration of intravenous contrast. Multiplanar CT image reconstructions and MIPs were obtained  to evaluate the vascular anatomy. RADIATION DOSE REDUCTION: This exam was performed according to the departmental dose-optimization program which includes automated exposure control, adjustment of the mA and/or kV according to patient size and/or use of iterative reconstruction technique. CONTRAST:  83mL OMNIPAQUE IOHEXOL 350 MG/ML SOLN COMPARISON:  04/11/2012 FINDINGS: Cardiovascular: Slightly suboptimal bolus timing and resultant opacification of the pulmonary arterial tree, however, the examination is still diagnostic for evaluation through the segmental level. No intraluminal filling defect identified to suggest acute pulmonary embolism. Central pulmonary arteries are of normal caliber. Mild coronary artery calcification. Global cardiac size within normal limits. No pericardial effusion. Mild atherosclerotic calcification within the thoracic aorta. No aortic aneurysm. Aberrant origin of the right subclavian artery. Mediastinum/Nodes: Multiple bilateral thyroid nodules are identified measuring up to 23 mm within the right thyroid lobe, stable since prior examination and unlikely of clinical significance given their stability over time. No follow-up imaging is recommended for these lesions. No pathologic thoracic adenopathy. Esophagus unremarkable. Lungs/Pleura: Multifocal ground-glass pulmonary infiltrates are seen scattered throughout the lungs, more focally within the left lower lobe, possibly infectious or inflammatory in nature as can be seen with atypical infection. No pneumothorax or pleural effusion. Central airways are widely patent. Upper Abdomen: No acute abnormality. Musculoskeletal: No chest wall abnormality. No acute or significant osseous findings. Review of the MIP images confirms the above findings. IMPRESSION: 1. No  pulmonary embolism. 2. Multifocal ground-glass pulmonary infiltrates, more focally within the left lower lobe, possibly infectious or inflammatory in nature as can be seen with atypical infection. 3. Mild coronary artery calcification. Aortic Atherosclerosis (ICD10-I70.0). Electronically Signed   By: Fidela Salisbury M.D.   On: 12/13/2021 21:08   CT Head Wo Contrast  Result Date: 12/13/2021 CLINICAL DATA:  Head trauma, moderate-severe. Loss of consciousness. Received CPR. EXAM: CT HEAD WITHOUT CONTRAST TECHNIQUE: Contiguous axial images were obtained from the base of the skull through the vertex without intravenous contrast. RADIATION DOSE REDUCTION: This exam was performed according to the departmental dose-optimization program which includes automated exposure control, adjustment of the mA and/or kV according to patient size and/or use of iterative reconstruction technique. COMPARISON:  Head CT report from 07/21/1996 FINDINGS: Brain: There is no evidence of an acute infarct, intracranial hemorrhage, mass, midline shift, or extra-axial fluid collection. The ventricles and sulci are normal. Vascular: No hyperdense vessel. Skull: No fracture or suspicious osseous lesion. Sinuses/Orbits: Visualized paranasal sinuses and mastoid air cells are clear. Unremarkable orbits. Other: None. IMPRESSION: Negative head CT. Electronically Signed   By: Logan Bores M.D.   On: 12/13/2021 21:02    EKG: Independently reviewed.  Sinus tachycardia, nonspecific T wave abnormalities, QTc 470.  Rate increased since prior tracing.  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  Takes oxycodone at home, however, opiate overdose less likely as upon EMS arrival, patient was awake and breathing spontaneously, following commands.  She did not require administration of Narcan.  CTA chest negative for PE.  CT head negative for acute finding and neuro exam nonfocal.  EKG showing  sinus tachycardia with nonspecific T wave abnormalities, QTc 470.  High-sensitivity troponin negative. No tongue biting, incontinence, or seizure-like activity reported.  -Cardiac monitoring -Repeat troponin pending -Echocardiogram -EEG -Orthostatics -UDS -Avoid opiates -History concerning for undiagnosed sleep apnea.  Will need outpatient sleep study.  Possible CAP No fever, leukocytosis, lactic acidosis, or hypoxia.  CT showing multifocal groundglass pulmonary infiltrates, more focally within the left lower lobe, possibly infectious or  inflammatory in nature as can be seen with atypical infection.  -Ceftriaxone and doxycycline -Check procalcitonin level, if elevated, continue antibiotics -COVID and influenza PCR pending, continue airborne and contact precautions at this time -Blood cultures  Vomiting Had 1 episode of vomiting at work before she lost consciousness.  No recurrence since then.  No elevation of LFTs or lipase.  Abdominal exam benign. -Continue to monitor  Hypertension Systolic currently in the 150s. -Hold antihypertensives at this time and check orthostatics  Anxiety Hyperlipidemia GERD -Pharmacy med rec pending.  DVT prophylaxis: Lovenox Code Status: Full Code (discussed with the patient) Family Communication: No family at bedside. Level of care: Telemetry bed Admission status: It is my clinical opinion that referral for OBSERVATION is reasonable and necessary in this patient based on the above information provided. The aforementioned taken together are felt to place the patient at high risk for further clinical deterioration. However, it is anticipated that the patient may be medically stable for discharge from the hospital within 24 to 48 hours.   Shela Leff MD [facility]  If 7PM-7AM, please contact night-coverage [email or web address]  12/13/2021, 9:32 PM

## 2021-12-13 NOTE — ED Provider Notes (Signed)
[facility] EMERGENCY DEPARTMENT Provider Note   CSN: [id number] Arrival date & time: 12/13/21  1802     History  Chief Complaint  Patient presents with   Loss of Consciousness    Kristin Aguirre is a 61 y.o. female.  Patient brought in by EMS with episode of loss of consciousness at work.  EMS reports patient became unresponsive while sitting in her chair and bystanders performed CPR.  On fire department arrival she was cyanotic and given several rescue breaths by bag mask ventilation and laid to the ground.  On EMS arrival patient was awake and following commands and breathing spontaneously.  Unclear that she ever lost pulses.  She is awake on arrival remembers feeling dizzy and lightheaded.  States she has not felt well for several days including nausea, aches, chills, cough and runny nose.  She was concerned she could have COVID again.  After eating lunch today she had an episode of vomiting and felt worse after that.  She remembers feeling dizzy and then remembers the fire department arriving and laying her to the ground.  There is no tongue biting or seizure-like activity or incontinence.  Her department arrival she was apparently blue and unresponsive and required bag-valve-mask ventilation.  She denies any preceding chest pain or shortness of breath.  Complains of back pain now from riding in the ambulance.  Denies headache.  Denies unilateral weakness, numbness or tingling.  No visual changes.  The history is provided by the patient and the EMS personnel.  Loss of Consciousness Associated symptoms: dizziness, nausea, vomiting and weakness   Associated symptoms: no chest pain and no shortness of breath        Home Medications Prior to Admission medications   Medication Sig Start Date End Date Taking? Authorizing Provider  albuterol (VENTOLIN HFA) 108 (90 Base) MCG/ACT inhaler Inhale 1-2 puffs into the lungs every 6 (six) hours as needed (cough). 01/12/21    Boddu, Belenda Cruise, FNP  ALPRAZolam Prudy Feeler) 0.5 MG tablet Take 1 tablet (0.5 mg total) by mouth 2 (two) times daily as needed. Due for 6 month controlled substance office visit. Please call and schedule an appointment. Patient not taking: Reported on 12/03/2021 06/05/21   Merita Norton T, FNP  Ascorbic Acid (VITAMIN C PO) Take 1 tablet by mouth daily at 12 noon. 04/04/14   [provider]  aspirin EC 81 MG tablet Take 81 mg by mouth daily. Swallow whole.    [provider]  citalopram (CELEXA) 40 MG tablet Take 1 tablet (40 mg total) by mouth at bedtime. 10/20/20   Jacky Kindle, FNP  fluticasone (FLONASE) 50 MCG/ACT nasal spray Place 2 sprays into both nostrils daily. Patient taking differently: Place 2 sprays into both nostrils as needed. 10/20/20   Jacky Kindle, FNP  hydrochlorothiazide (HYDRODIURIL) 25 MG tablet Take 1 tablet (25 mg total) by mouth daily. 06/20/21   Jacky Kindle, FNP  lisinopril (ZESTRIL) 10 MG tablet Take 1 tablet (10 mg total) by mouth daily. 10/20/20   Jacky Kindle, FNP  Multiple Vitamins-Minerals (ALIVE ONCE DAILY WOMENS PO) Take 2 tablets by mouth daily at 12 noon. gummies    [provider]  oxyCODONE-acetaminophen (PERCOCET) 7.5-325 MG tablet Take 1 tablet by mouth every 6 (six) hours as needed for severe pain. 12/03/21 12/03/22  Caro Laroche, DO  pantoprazole (PROTONIX) 40 MG tablet TAKE 1 TABLET BY MOUTH ONCE DAILY Patient taking differently: Take 40 mg by mouth daily at  12 noon. 02/22/20   Margaretann Loveless, PA-C  propranolol (INDERAL) 20 MG tablet Take 1 tablet (20 mg total) by mouth 2 (two) times daily. 06/20/21   Jacky Kindle, FNP  rosuvastatin (CRESTOR) 10 MG tablet Take 1 tablet (10 mg total) by mouth daily. 10/23/20   Jacky Kindle, FNP  SUMAtriptan (IMITREX) 50 MG tablet Take 1 tablet (50 mg total) by mouth every 2 (two) hours as needed for migraine (2 doses max per day). May repeat in 2 hours if headache persists or recurs. Patient not  taking: Reported on 12/03/2021 06/20/21   Merita Norton T, FNP      Allergies    Furosemide    Review of Systems   Review of Systems  HENT:  Negative for congestion and rhinorrhea.   Respiratory:  Negative for chest tightness and shortness of breath.   Cardiovascular:  Positive for syncope. Negative for chest pain.  Gastrointestinal:  Positive for nausea and vomiting. Negative for abdominal pain.  Genitourinary:  Negative for dysuria.  Musculoskeletal:  Negative for arthralgias and myalgias.  Neurological:  Positive for dizziness, speech difficulty, weakness and light-headedness.   all other systems are negative except as noted in the HPI and PMH.    Physical Exam Updated Vital Signs BP (!) 157/93 (BP Location: Right Arm)   Pulse 98   Temp 98.3 F (36.8 C) (Oral)   Resp (!) 22   Ht 5\' 9"  (1.753 m)   Wt 90.7 kg   LMP 03/18/2001 (Approximate)   SpO2 99%   BMI 29.53 kg/m  Physical Exam Vitals and nursing note reviewed.  Constitutional:      General: She is not in acute distress.    Appearance: She is well-developed. She is obese.  HENT:     Head: Normocephalic and atraumatic.     Mouth/Throat:     Mouth: Mucous membranes are dry.     Pharynx: No oropharyngeal exudate.  Eyes:     Conjunctiva/sclera: Conjunctivae normal.     Pupils: Pupils are equal, round, and reactive to light.  Neck:     Comments: No meningismus. Cardiovascular:     Rate and Rhythm: Regular rhythm. Tachycardia present.     Heart sounds: Normal heart sounds. No murmur heard. Pulmonary:     Effort: Pulmonary effort is normal. No respiratory distress.     Breath sounds: Normal breath sounds.  Abdominal:     Palpations: Abdomen is soft.     Tenderness: There is no abdominal tenderness. There is no guarding or rebound.  Musculoskeletal:        General: No tenderness. Normal range of motion.     Cervical back: Normal range of motion and neck supple.  Skin:    General: Skin is warm.  Neurological:      Mental Status: She is alert and oriented to person, place, and time.     Cranial Nerves: No cranial nerve deficit.     Motor: No abnormal muscle tone.     Coordination: Coordination normal.     Comments:  5/5 strength throughout. CN 2-12 intact.Equal grip strength.   Psychiatric:        Behavior: Behavior normal.     ED Results / Procedures / Treatments   Labs (all labs ordered are listed, but only abnormal results are displayed) Labs Reviewed  CBC WITH DIFFERENTIAL/PLATELET - Abnormal; Notable for the following components:      Result Value   Hemoglobin 11.3 (*)    HCT 35.5 (*)  All other components within normal limits  COMPREHENSIVE METABOLIC PANEL - Abnormal; Notable for the following components:   Glucose, Bld 106 (*)    Total Bilirubin 0.2 (*)    All other components within normal limits  D-DIMER, QUANTITATIVE - Abnormal; Notable for the following components:   D-Dimer, Quant 0.68 (*)    All other components within normal limits  SARS CORONAVIRUS 2 BY RT PCR  LIPASE, BLOOD  LACTIC ACID, PLASMA  LACTIC ACID, PLASMA  URINALYSIS, ROUTINE W REFLEX MICROSCOPIC  RAPID URINE DRUG SCREEN, HOSP PERFORMED  TROPONIN I (HIGH SENSITIVITY)  TROPONIN I (HIGH SENSITIVITY)    EKG EKG Interpretation  Date/Time:  Thursday December 13 2021 18:06:57 EDT Ventricular Rate:  103 PR Interval:  183 QRS Duration: 98 QT Interval:  359 QTC Calculation: 470 R Axis:   -35 Text Interpretation: Sinus tachycardia Left axis deviation Low voltage, precordial leads Abnormal R-wave progression, early transition Abnrm T, consider ischemia, anterolateral lds Rate faster Nonspecific T wave abnormality Confirmed by Glynn Octave [phone]) on 12/13/2021 6:11:28 PM  Radiology CT Angio Chest PE W and/or Wo Contrast  Result Date: 12/13/2021 CLINICAL DATA:  Pulmonary embolism (PE) suspected, positive D-dimer. Cardiopulmonary arrest EXAM: CT ANGIOGRAPHY CHEST WITH CONTRAST TECHNIQUE: Multidetector CT  imaging of the chest was performed using the standard protocol during bolus administration of intravenous contrast. Multiplanar CT image reconstructions and MIPs were obtained to evaluate the vascular anatomy. RADIATION DOSE REDUCTION: This exam was performed according to the departmental dose-optimization program which includes automated exposure control, adjustment of the mA and/or kV according to patient size and/or use of iterative reconstruction technique. CONTRAST:  30mL OMNIPAQUE IOHEXOL 350 MG/ML SOLN COMPARISON:  04/11/2012 FINDINGS: Cardiovascular: Slightly suboptimal bolus timing and resultant opacification of the pulmonary arterial tree, however, the examination is still diagnostic for evaluation through the segmental level. No intraluminal filling defect identified to suggest acute pulmonary embolism. Central pulmonary arteries are of normal caliber. Mild coronary artery calcification. Global cardiac size within normal limits. No pericardial effusion. Mild atherosclerotic calcification within the thoracic aorta. No aortic aneurysm. Aberrant origin of the right subclavian artery. Mediastinum/Nodes: Multiple bilateral thyroid nodules are identified measuring up to 23 mm within the right thyroid lobe, stable since prior examination and unlikely of clinical significance given their stability over time. No follow-up imaging is recommended for these lesions. No pathologic thoracic adenopathy. Esophagus unremarkable. Lungs/Pleura: Multifocal ground-glass pulmonary infiltrates are seen scattered throughout the lungs, more focally within the left lower lobe, possibly infectious or inflammatory in nature as can be seen with atypical infection. No pneumothorax or pleural effusion. Central airways are widely patent. Upper Abdomen: No acute abnormality. Musculoskeletal: No chest wall abnormality. No acute or significant osseous findings. Review of the MIP images confirms the above findings. IMPRESSION: 1. No  pulmonary embolism. 2. Multifocal ground-glass pulmonary infiltrates, more focally within the left lower lobe, possibly infectious or inflammatory in nature as can be seen with atypical infection. 3. Mild coronary artery calcification. Aortic Atherosclerosis (ICD10-I70.0). Electronically Signed   By: Helyn Numbers M.D.   On: 12/13/2021 21:08   CT Head Wo Contrast  Result Date: 12/13/2021 CLINICAL DATA:  Head trauma, moderate-severe. Loss of consciousness. Received CPR. EXAM: CT HEAD WITHOUT CONTRAST TECHNIQUE: Contiguous axial images were obtained from the base of the skull through the vertex without intravenous contrast. RADIATION DOSE REDUCTION: This exam was performed according to the departmental dose-optimization program which includes automated exposure control, adjustment of the mA and/or kV according to patient size and/or use  of iterative reconstruction technique. COMPARISON:  Head CT report from 07/21/1996 FINDINGS: Brain: There is no evidence of an acute infarct, intracranial hemorrhage, mass, midline shift, or extra-axial fluid collection. The ventricles and sulci are normal. Vascular: No hyperdense vessel. Skull: No fracture or suspicious osseous lesion. Sinuses/Orbits: Visualized paranasal sinuses and mastoid air cells are clear. Unremarkable orbits. Other: None. IMPRESSION: Negative head CT. Electronically Signed   By: Logan Bores M.D.   On: 12/13/2021 21:02    Procedures Procedures    Medications Ordered in ED Medications  lactated ringers bolus 1,000 mL (has no administration in time range)    ED Course/ Medical Decision Making/ A&P                           Medical Decision Making Amount and/or Complexity of Data Reviewed Labs: ordered. Decision-making details documented in ED Course. Radiology: ordered and independent interpretation performed. Decision-making details documented in ED Course. ECG/medicine tests: ordered and independent interpretation performed.  Decision-making details documented in ED Course.  Risk Prescription drug management. Decision regarding hospitalization.  Episode of loss of consciousness preceded by dizziness, lightheadedness and vomiting.  Now awake and alert.  Denies chest pain or shortness of breath.  EKG shows sinus rhythm with nonspecific T wave changes anteriorly.  No Brugada, no prolonged QT.  Patient given IV fluids.  EKG shows no prolonged QT and no Brugada.  Denies chest pain or shortness of breath. Tensive but did not take her medications tonight.  Electrolytes are reassuring.  Troponin negative.  D-dimer mildly elevated.  CT scan negative for pulmonary embolism.  Does show groundglass opacities at the bases.  She has had recent URI symptoms.  Will check COVID test.  Unclear etiology of her syncope.  There is concern that she required bag-valve-mask ventilation as well as bystander CPR.  May have in fact be vasovagal but cannot rule out cardiac arrhythmia or other pathologies at this time.  Observation admission discussed with Dr. Marlowe Sax.        Final Clinical Impression(s) / ED Diagnoses Final diagnoses:  Syncope and collapse    Rx / DC Orders ED Discharge Orders     None         Kayia Billinger, Annie Main, MD 12/13/21 2153

## 2021-12-13 NOTE — ED Triage Notes (Signed)
Pt threw up at work and then passed out at desk. LOC. Coworkers initiated CPR, fire arrived on scene and said pt was blue and unresponsive. EMS arrived and pt was alert. Has been feeling bad since last weekend.

## 2021-12-14 ENCOUNTER — Observation Stay (HOSPITAL_COMMUNITY): Payer: 59

## 2021-12-14 ENCOUNTER — Encounter (HOSPITAL_COMMUNITY): Payer: Self-pay | Admitting: Internal Medicine

## 2021-12-14 ENCOUNTER — Other Ambulatory Visit: Payer: Self-pay

## 2021-12-14 ENCOUNTER — Other Ambulatory Visit: Payer: Self-pay | Admitting: Cardiology

## 2021-12-14 DIAGNOSIS — R7989 Other specified abnormal findings of blood chemistry: Secondary | ICD-10-CM

## 2021-12-14 DIAGNOSIS — M069 Rheumatoid arthritis, unspecified: Secondary | ICD-10-CM | POA: Diagnosis present

## 2021-12-14 DIAGNOSIS — Z833 Family history of diabetes mellitus: Secondary | ICD-10-CM | POA: Diagnosis not present

## 2021-12-14 DIAGNOSIS — I2510 Atherosclerotic heart disease of native coronary artery without angina pectoris: Secondary | ICD-10-CM

## 2021-12-14 DIAGNOSIS — F411 Generalized anxiety disorder: Secondary | ICD-10-CM | POA: Diagnosis present

## 2021-12-14 DIAGNOSIS — F32A Depression, unspecified: Secondary | ICD-10-CM | POA: Diagnosis present

## 2021-12-14 DIAGNOSIS — Z8249 Family history of ischemic heart disease and other diseases of the circulatory system: Secondary | ICD-10-CM | POA: Diagnosis not present

## 2021-12-14 DIAGNOSIS — D649 Anemia, unspecified: Secondary | ICD-10-CM | POA: Diagnosis present

## 2021-12-14 DIAGNOSIS — R55 Syncope and collapse: Secondary | ICD-10-CM

## 2021-12-14 DIAGNOSIS — Z23 Encounter for immunization: Secondary | ICD-10-CM | POA: Diagnosis not present

## 2021-12-14 DIAGNOSIS — Z825 Family history of asthma and other chronic lower respiratory diseases: Secondary | ICD-10-CM | POA: Diagnosis not present

## 2021-12-14 DIAGNOSIS — Z823 Family history of stroke: Secondary | ICD-10-CM | POA: Diagnosis not present

## 2021-12-14 DIAGNOSIS — G4730 Sleep apnea, unspecified: Secondary | ICD-10-CM | POA: Diagnosis present

## 2021-12-14 DIAGNOSIS — M1710 Unilateral primary osteoarthritis, unspecified knee: Secondary | ICD-10-CM | POA: Diagnosis present

## 2021-12-14 DIAGNOSIS — J189 Pneumonia, unspecified organism: Secondary | ICD-10-CM | POA: Diagnosis present

## 2021-12-14 DIAGNOSIS — Z803 Family history of malignant neoplasm of breast: Secondary | ICD-10-CM | POA: Diagnosis not present

## 2021-12-14 DIAGNOSIS — E785 Hyperlipidemia, unspecified: Secondary | ICD-10-CM | POA: Diagnosis present

## 2021-12-14 DIAGNOSIS — G8929 Other chronic pain: Secondary | ICD-10-CM | POA: Diagnosis present

## 2021-12-14 DIAGNOSIS — Z888 Allergy status to other drugs, medicaments and biological substances status: Secondary | ICD-10-CM | POA: Diagnosis not present

## 2021-12-14 DIAGNOSIS — T402X5A Adverse effect of other opioids, initial encounter: Secondary | ICD-10-CM | POA: Diagnosis present

## 2021-12-14 DIAGNOSIS — E7800 Pure hypercholesterolemia, unspecified: Secondary | ICD-10-CM | POA: Diagnosis not present

## 2021-12-14 DIAGNOSIS — Z1152 Encounter for screening for COVID-19: Secondary | ICD-10-CM | POA: Diagnosis not present

## 2021-12-14 DIAGNOSIS — K219 Gastro-esophageal reflux disease without esophagitis: Secondary | ICD-10-CM | POA: Diagnosis present

## 2021-12-14 DIAGNOSIS — I10 Essential (primary) hypertension: Secondary | ICD-10-CM | POA: Diagnosis present

## 2021-12-14 DIAGNOSIS — Y929 Unspecified place or not applicable: Secondary | ICD-10-CM | POA: Diagnosis not present

## 2021-12-14 DIAGNOSIS — E669 Obesity, unspecified: Secondary | ICD-10-CM | POA: Diagnosis present

## 2021-12-14 DIAGNOSIS — T424X5A Adverse effect of benzodiazepines, initial encounter: Secondary | ICD-10-CM | POA: Diagnosis present

## 2021-12-14 DIAGNOSIS — Z90710 Acquired absence of both cervix and uterus: Secondary | ICD-10-CM | POA: Diagnosis not present

## 2021-12-14 LAB — TROPONIN I (HIGH SENSITIVITY)
Troponin I (High Sensitivity): 38 ng/L — ABNORMAL HIGH (ref <18)
Troponin I (High Sensitivity): 33 ng/L — ABNORMAL HIGH (ref <18)

## 2021-12-14 LAB — PROCALCITONIN: Procalcitonin: <0.1 ng/mL

## 2021-12-14 LAB — ECHOCARDIOGRAM COMPLETE
AR max vel: 2.38 cm2
AV Area VTI: 2.84 cm2
AV Area mean vel: 2.37 cm2
AV Mean grad: 6 mmHg
AV Peak grad: 10.9 mmHg
Ao pk vel: 1.65 m/s
Area-P 1/2: 3.48 cm2
Height: 69 in
S' Lateral: 2.9 cm
Weight: 3199.32 oz

## 2021-12-14 LAB — LACTIC ACID, PLASMA: Lactic Acid, Venous: 1.6 mmol/L (ref 0.5–1.9)

## 2021-12-14 MED ORDER — OXYCODONE-ACETAMINOPHEN 7.5-325 MG PO TABS
1.0000 | ORAL_TABLET | Freq: Four times a day (QID) | ORAL | Status: DC | PRN
  Administered 2021-12-14 – 2021-12-15 (×2): 1 via ORAL
  Filled 2021-12-14 (×2): qty 1

## 2021-12-14 MED ORDER — SUMATRIPTAN SUCCINATE 50 MG PO TABS
50.0000 mg | ORAL_TABLET | ORAL | Status: DC | PRN
  Administered 2021-12-14 – 2021-12-17 (×6): 50 mg via ORAL
  Filled 2021-12-14 (×10): qty 1

## 2021-12-14 MED ORDER — IBUPROFEN 600 MG PO TABS
600.0000 mg | ORAL_TABLET | Freq: Four times a day (QID) | ORAL | Status: DC | PRN
  Administered 2021-12-14 – 2021-12-15 (×2): 600 mg via ORAL
  Filled 2021-12-14 (×2): qty 1

## 2021-12-14 MED ORDER — INFLUENZA VAC SPLIT QUAD 0.5 ML IM SUSY
0.5000 mL | PREFILLED_SYRINGE | INTRAMUSCULAR | Status: AC
  Administered 2021-12-16: 0.5 mL via INTRAMUSCULAR
  Filled 2021-12-14: qty 0.5

## 2021-12-14 MED ORDER — SENNOSIDES-DOCUSATE SODIUM 8.6-50 MG PO TABS
1.0000 | ORAL_TABLET | Freq: Every day | ORAL | Status: DC
  Administered 2021-12-14 – 2021-12-16 (×3): 1 via ORAL
  Filled 2021-12-14 (×3): qty 1

## 2021-12-14 MED ORDER — HYDROMORPHONE HCL 1 MG/ML IJ SOLN
0.5000 mg | INTRAMUSCULAR | Status: DC | PRN
  Administered 2021-12-14: 0.5 mg via INTRAVENOUS
  Filled 2021-12-14: qty 1

## 2021-12-14 NOTE — ED Notes (Signed)
PRN dose of Imitrex requested from pharmacy.

## 2021-12-14 NOTE — Consult Note (Addendum)
CARDIOLOGY CONSULT NOTE  Patient ID: Kristin Aguirre MRN: [id number] DOB/AGE: [birth year] 62 y.o.  Admit date: 12/13/2021 Referring Physician: [facility] hospitalist Reason for Consultation:  Syncope  HPI:   62 y.o. Caucasian female  with hypertension, hyperlipidemia, GERD, former cigarette smoker, currently vapes incessantly (sleeps with vape in her hand), ongoing workup for possible OSA, admitted with possible syncope, ?brief cardiac arrest, troponin elevation  I saw the patient in ER, her daughter present at bedside. 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 She had a burger and fries from [facility] for lunch around 12:30, along with "ghost pepper"-something she has never had before. This was particularly hot, and she felt nauseated, had diarrhea, and at least one vomiting episode. Following this, she felt lightheaded, came back to her desk and put her head down. Patient does not recollect events following this, but was found unresponsive by her coworker. Coworkers could not find a pulse, called EMS and performed brief CPR. When EMS arrived, patient was reportedly cyanotic, but had a pulse. She was brought to [facility] ER, and was alert by then. She denied chest pain, shortness of breath at any time. She was given IV fluids.   Workup showed mildly elevated d-Dimer, CTA with no PE, mild coronary calcification, normal echocardiogram, HS trip 15-->38-->33.  At baseline, patient has been under a lot of stress due to some home/family related issues. She quit cigarette smoking, but has started vaping incessantly. She wants to quit.    Past Medical History:  Diagnosis Date   Allergy    Anxiety    Diffuse cystic mastopathy    LEFT SIDE   GERD (gastroesophageal reflux disease)    High cholesterol    PONV (postoperative nausea and vomiting)    Sepsis (HCC) 08/13/2021     Past Surgical History:  Procedure Laterality Date   ABDOMINAL HYSTERECTOMY  2003   BREAST BIOPSY Left  2011, 2015   neg   BREAST BIOPSY Left 07/17/2009   Vacuum biopsy, 4:00 position: 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   cervical spine repair      CESAREAN SECTION     TONSILLECTOMY        Family History  Problem Relation Age of Onset   Heart block Mother    Cancer Mother        on leg   Diabetes Mother    Heart disease Mother    Diabetes Father    Heart disease Father    CVA Father    Asthma Sister    Anemia Sister    Seizures Brother    Breast cancer Maternal Grandmother 7     Social History: Social History   Socioeconomic History   Marital status: Single    Spouse name: Not on file   Number of children: Not on file   Years of education: Not on file   Highest education level: Not on file  Occupational History   Not on file  Tobacco Use   Smoking status: Former    Packs/day: 0.25    Types: Cigarettes    Start date: 04/03/2012    Quit date: 07/16/2021    Years since quitting: 0.4   Smokeless tobacco: Never   Tobacco comments:    SMokes 1 cigarettes daily  Vaping Use   Vaping Use: Some days  Substance and Sexual Activity   Alcohol use: Yes    Alcohol/week: 5.0 standard drinks of alcohol  Types: 5 Standard drinks or equivalent per week   Drug use: No   Sexual activity: Not on file  Other Topics Concern   Not on file  Social History Narrative   Not on file   Social Determinants of Health   Financial Resource Strain: Not on file  Food Insecurity: Not on file  Transportation Needs: Not on file  Physical Activity: Not on file  Stress: Not on file  Social Connections: Not on file  Intimate Partner Violence: Not on file     (Not in a hospital admission)   Review of Systems  Cardiovascular:  Positive for syncope. Negative for chest pain, dyspnea on exertion, leg swelling and palpitations.  Neurological:  Positive for light-headedness.      Physical  Exam: Physical Exam Vitals and nursing note reviewed.  Constitutional:      General: She is not in acute distress. Neck:     Vascular: No JVD.  Cardiovascular:     Rate and Rhythm: Normal rate and regular rhythm.     Heart sounds: Normal heart sounds. No murmur heard. Pulmonary:     Effort: Pulmonary effort is normal.     Breath sounds: Normal breath sounds. No wheezing or rales.  Musculoskeletal:     Right lower leg: No edema.     Left lower leg: No edema.        Imaging/tests reviewed and independently interpreted: Lab Results:  Latest Reference Range & Units 12/13/21 18:11 12/14/21 00:15 12/14/21 02:25  Troponin I (High Sensitivity) <18 ng/L 15 38 (H) 33 (H)  (H): Data is abnormally high   Latest Reference Range & Units 12/13/21 18:11  D-Dimer, Quant 0.00 - 0.50 ug/mL-FEU 0.68 (H)  (H): Data is abnormally high    Cardiac Studies:  Telemetry 12/14/2021: No significant arhythmia  EKG 12/13/2021: Sinus tachycardia 103 bpm Left axis deviation Low voltage  Echocardiogram 12/14/2021:  1. Left ventricular ejection fraction, by estimation, is 60 to 65%. The  left ventricle has normal function. The left ventricle has no regional  wall motion abnormalities. Left ventricular diastolic parameters were  normal.   2. Right ventricular systolic function is normal. The right ventricular  size is normal. Tricuspid regurgitation signal is inadequate for assessing  PA pressure.   3. The mitral valve is normal in structure. No evidence of mitral valve  regurgitation. No evidence of mitral stenosis.   4. The aortic valve is tricuspid. Aortic valve regurgitation is not  visualized. No aortic stenosis is present.   5. The inferior vena cava is normal in size with greater than 50%  respiratory variability, suggesting right atrial pressure of 3 mmHg.   Comparison(s): No prior Echocardiogram.   CTA chest 12/13/2021: 1. No pulmonary embolism. 2. Multifocal ground-glass pulmonary  infiltrates, more focally within the left lower lobe, possibly infectious or inflammatory in nature as can be seen with atypical infection. 3. Mild coronary artery calcification.   Aortic Atherosclerosis (ICD10-I70.0).    Assessment & Recommendations:  61 y.o. Caucasian female  with hypertension, hyperlipidemia, GERD, former cigarette smoker, currently vapes incessantly (sleeps with vape in her hand), ongoing workup for possible OSA, admitted with possible syncope, ?brief cardiac arrest, troponin elevation  Syncope/cardiac arrest: Unclear if she had true cardiac arrest with loss of pulse. She has been hemodynamically and electrically stable while at [facility], alert awake, normal EEG Syncope episode appears more likely, with antecedent events being possibly food related GI intolerance and lightheadedness. Vasovagal syncope or hypotension most likely inciting event. She  has felt better after IV hydration. Echocardiogram with structurally normal heart, normal LVEF. No arrhthymias during hospital stay. Overall, low suspicion of cardiogenic syncope. Will arrange outpatient cardiac telemetry (to be placed on 10/2). Recommend checking orthostatic hypotension and ambulation before discharge, whether today or tomorrow.   Troponin elevation: While there is Delta of 23, highly unlikely to be ACS presentation in absence of chest pain, EKG changes.  Most likely supply demand mismatch in the setting of syncope, possible hypotension, and CPR.  Given her risk factors of hypertension, hyperlipidemia, smoking and vaping history, family h/o CAD, will arrange outpatient exercise nuclear stress test Continue Aspirin, Crestor  Abnormal chest Xray: Ground glass opacity. ?Possible atypical infection. Management as per primary team  Discussed interpretation of tests and management recommendations with the primary team     Elder Negus, MD Pager: [phone] Office: [phone]

## 2021-12-14 NOTE — Progress Notes (Signed)
EEG complete - results pending 

## 2021-12-14 NOTE — Progress Notes (Signed)
PROGRESS NOTE  Kristin Aguirre HWE:[id number] DOB: 05/08/1960 DOA: 12/13/2021 PCP: [facility]  HPI/Recap of past 24 hours:  61 y.o. Caucasian female  with hypertension, hyperlipidemia, GERD, former cigarette smoker, currently vapes incessantly (sleeps with vape in her hand), ongoing workup for possible OSA, admitted with possible syncope, questionable brief cardiac arrest, troponin elevation.  Found unresponsive by her coworker.  Coworkers could not find a pulse, called EMS and performed brief CPR. When EMS arrived, patient was reportedly cyanotic, but had a pulse. She was brought to [facility] ER, and was alert by then. She denied chest pain, shortness of breath at any time. She was given IV fluids.    Workup showed mildly elevated d-Dimer, CTA with no PE, mild coronary calcification, normal echocardiogram, HS trip 15-->38-->33.   At baseline, patient has been under a lot of stress due to some home/family related issues. She quit cigarette smoking, but has started vaping incessantly.   12/14/2021: The patient was seen and examined at bedside.  Reports severe lower back pain and soreness in her chest from brief CPR.  Analgesics provided.  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  Takes oxycodone at home, however, opiate overdose less likely as upon EMS arrival, patient was awake and breathing spontaneously, following commands.  She did not require administration of Narcan.  CTA chest negative for PE.  CT head negative for acute finding and neuro exam nonfocal.  EKG showing sinus tachycardia with nonspecific T wave abnormalities, QTc 470.  High-sensitivity troponin negative. No  tongue biting, incontinence, or seizure-like activity reported.  -Cardiac monitoring -Repeat troponin pending -Echocardiogram, normal LVEF with no wall motion abnormality. -EEG negative for seizure activity -Orthostatics positive -UDS none detected. -Avoid opiates -History concerning for undiagnosed sleep apnea.  Outpatient work-up has been initiated.   Possible CAP No fever, leukocytosis, lactic acidosis, or hypoxia.  CT showing multifocal groundglass pulmonary infiltrates, more focally within the left lower lobe, possibly infectious or inflammatory in nature as can be seen with atypical infection.  -Ceftriaxone and doxycycline, continue for now. COVID-negative   Vomiting Had 1 episode of vomiting at work before she lost consciousness.  No recurrence since then.  No elevation of LFTs or lipase.  Abdominal exam benign. -Continue to monitor   Hypertension BP stable. Continue to monitor vital signs.   Anxiety Hyperlipidemia GERD Resume home regimen   DVT prophylaxis: Lovenox subcu daily Code Status: Full Code (discussed with the patient) Family Communication: No family at bedside. Level of care: Telemetry bed    Status is: Inpatient The patient requires at least 2 midnights for further evaluation and treatment.  Condition.    Objective: Vitals:   12/14/21 0935 12/14/21 1000 12/14/21 1300 12/14/21 1339  BP:  (!) 150/83 118/67 133/62  Pulse:  76 77 74  Resp:  20 18 18   Temp: 98.6 F (37 C)  98.5 F (36.9 C) 98.5 F (36.9 C)  TempSrc: Oral  Oral Oral  SpO2:  99% 99% 97%  Weight:    105.3 kg  Height:    5\' 9"  (1.753 m)    Intake/Output Summary (Last 24 hours) at 12/14/2021 1610 Last data filed at 12/14/2021 1500 Gross per 24 hour  Intake 321.97 ml  Output --  Net 321.97 ml   Filed Weights   12/13/21 1809 12/14/21 1339  Weight: 90.7 kg 105.3 kg    Exam:  General: 61 y.o. year-old female well developed well nourished in no acute distress.  Alert and  oriented x3. Cardiovascular: Regular rate and rhythm with no rubs or gallops.  No thyromegaly or JVD noted.   Respiratory: Clear to auscultation with no wheezes or rales. Good inspiratory effort. Abdomen: Soft nontender nondistended with normal bowel sounds x4 quadrants. Musculoskeletal: No lower extremity edema. 2/4 pulses in all 4 extremities. Skin: No ulcerative lesions noted or rashes, Psychiatry: Mood is appropriate for condition and setting   Data Reviewed: CBC: Recent Labs  Lab 12/13/21 1811  WBC 9.8  NEUTROABS 7.2  HGB 11.3*  HCT 35.5*  MCV 90.8  PLT 254   Basic Metabolic Panel: Recent Labs  Lab 12/13/21 1811  NA 140  K 4.4  CL 104  CO2 24  GLUCOSE 106*  BUN 18  CREATININE 0.59  CALCIUM 9.1   GFR: Estimated Creatinine Clearance: 94.2 mL/min (by C-G formula based on SCr of 0.59 mg/dL). Liver Function Tests: Recent Labs  Lab 12/13/21 1811  AST 18  ALT 18  ALKPHOS 70  BILITOT 0.2*  PROT 7.4  ALBUMIN 3.8   Recent Labs  Lab 12/13/21 1811  LIPASE 29   No results for input(s): "AMMONIA" in the last 168 hours. Coagulation Profile: No results for input(s): "INR", "PROTIME" in the last 168 hours. Cardiac Enzymes: No results for input(s): "CKTOTAL", "CKMB", "CKMBINDEX", "TROPONINI" in the last 168 hours. BNP (last 3 results) No results for input(s): "PROBNP" in the last 8760 hours. HbA1C: No results for input(s): "HGBA1C" in the last 72 hours. CBG: No results for input(s): "GLUCAP" in the last 168 hours. Lipid Profile: No results for input(s): "CHOL", "HDL", "LDLCALC", "TRIG", "CHOLHDL", "LDLDIRECT" in the last 72 hours. Thyroid Function Tests: No results for input(s): "TSH", "T4TOTAL", "FREET4", "T3FREE", "THYROIDAB" in the last 72 hours. Anemia Panel: No results for input(s): "VITAMINB12", "FOLATE", "FERRITIN", "TIBC", "IRON", "RETICCTPCT" in the last 72 hours. Urine analysis:    Component Value Date/Time   COLORURINE STRAW (A) 12/13/2021 2209    APPEARANCEUR CLEAR 12/13/2021 2209   LABSPEC 1.014 12/13/2021 2209   PHURINE 7.0 12/13/2021 2209   GLUCOSEU NEGATIVE 12/13/2021 2209   HGBUR NEGATIVE 12/13/2021 2209   BILIRUBINUR NEGATIVE 12/13/2021 2209   KETONESUR NEGATIVE 12/13/2021 2209   PROTEINUR NEGATIVE 12/13/2021 2209   NITRITE NEGATIVE 12/13/2021 2209   LEUKOCYTESUR NEGATIVE 12/13/2021 2209   Sepsis Labs: @LABRCNTIP (procalcitonin:4,lacticidven:4)  ) Recent Results (from the past 240 hour(s))  SARS Coronavirus 2 by RT PCR (hospital order, performed in [facility] hospital lab) *cepheid single result test* Anterior Nasal Swab     Status: None   Collection Time: 12/13/21 10:09 PM   Specimen: Anterior Nasal Swab  Result Value Ref Range Status   SARS Coronavirus 2 by RT PCR NEGATIVE NEGATIVE Final    Comment: (NOTE) SARS-CoV-2 target nucleic acids are NOT DETECTED.  The SARS-CoV-2 RNA is generally detectable in upper and lower respiratory specimens during the acute phase of infection. The lowest concentration of SARS-CoV-2 viral copies this assay can detect is 250 copies / mL. A negative result does not preclude SARS-CoV-2 infection and should not be used as the sole basis for treatment or other patient management decisions.  A negative result may occur with improper specimen collection / handling, submission of specimen other than nasopharyngeal swab, presence of viral mutation(s)  within the areas targeted by this assay, and inadequate number of viral copies (<250 copies / mL). A negative result must be combined with clinical observations, patient history, and epidemiological information.  Fact Sheet for Patients:   RoadLapTop.co.za  Fact Sheet for Healthcare Providers: http://kim-miller.com/  This test is not yet approved or  cleared by the [place] FDA and has been authorized for detection and/or diagnosis of SARS-CoV-2 by FDA under an Emergency Use Authorization  (EUA).  This EUA will remain in effect (meaning this test can be used) for the duration of the COVID-19 declaration under Section 564(b)(1) of the Act, 21 U.S.C. section 360bbb-3(b)(1), unless the authorization is terminated or revoked sooner.  Performed at [facility], [address] [place], [place] [address]       Studies: ECHOCARDIOGRAM COMPLETE  Result Date: 12/14/2021    ECHOCARDIOGRAM REPORT   Patient Name:   Kristin Aguirre Date of Exam: 12/14/2021 Medical Rec #:  [id number]      Height:       69.0 in Accession #:    [id number]     Weight:       200.0 lb Date of Birth:  09-May-1960      BSA:          2.066 m Patient Age:    61 years       BP:           133/73 mmHg Patient Gender: F              HR:           72 bpm. Exam Location:  Inpatient Procedure: 2D Echo, Color Doppler and Cardiac Doppler Indications:    Syncope  History:        Patient has no prior history of Echocardiogram examinations.                 Risk Factors:Dyslipidemia.  Sonographer:    Gaynell Face Referring Phys: [id number] VASUNDHRA RATHORE IMPRESSIONS  1. Left ventricular ejection fraction, by estimation, is 60 to 65%. The left ventricle has normal function. The left ventricle has no regional wall motion abnormalities. Left ventricular diastolic parameters were normal.  2. Right ventricular systolic function is normal. The right ventricular size is normal. Tricuspid regurgitation signal is inadequate for assessing PA pressure.  3. The mitral valve is normal in structure. No evidence of mitral valve regurgitation. No evidence of mitral stenosis.  4. The aortic valve is tricuspid. Aortic valve regurgitation is not visualized. No aortic stenosis is present.  5. The inferior vena cava is normal in size with greater than 50% respiratory variability, suggesting right atrial pressure of 3 mmHg. Comparison(s): No prior Echocardiogram. FINDINGS  Left Ventricle: Left ventricular ejection fraction, by estimation, is 60 to 65%. The  left ventricle has normal function. The left ventricle has no regional wall motion abnormalities. The left ventricular internal cavity size was normal in size. There is  no left ventricular hypertrophy. Left ventricular diastolic parameters were normal. Right Ventricle: The right ventricular size is normal. Right ventricular systolic function is normal. Tricuspid regurgitation signal is inadequate for assessing PA pressure. The tricuspid regurgitant velocity is 2.28 m/s, and with an assumed right atrial  pressure of 3 mmHg, the estimated right ventricular systolic pressure is 23.8 mmHg. Left Atrium: Left atrial size was normal in size. Right Atrium: Right atrial size was normal in size. Pericardium: There is no evidence of pericardial effusion. Mitral Valve: The mitral valve is normal  in structure. No evidence of mitral valve regurgitation. No evidence of mitral valve stenosis. Tricuspid Valve: The tricuspid valve is normal in structure. Tricuspid valve regurgitation is trivial. No evidence of tricuspid stenosis. Aortic Valve: The aortic valve is tricuspid. Aortic valve regurgitation is not visualized. No aortic stenosis is present. Aortic valve mean gradient measures 6.0 mmHg. Aortic valve peak gradient measures 10.9 mmHg. Aortic valve area, by VTI measures 2.84  cm. Pulmonic Valve: The pulmonic valve was normal in structure. Pulmonic valve regurgitation is not visualized. No evidence of pulmonic stenosis. Aorta: The aortic root is normal in size and structure. Venous: The inferior vena cava is normal in size with greater than 50% respiratory variability, suggesting right atrial pressure of 3 mmHg. IAS/Shunts: No atrial level shunt detected by color flow Doppler.  LEFT VENTRICLE PLAX 2D LVIDd:         4.40 cm   Diastology LVIDs:         2.90 cm   LV e' medial:    10.60 cm/s LV PW:         1.00 cm   LV E/e' medial:  9.5 LV IVS:        1.20 cm   LV e' lateral:   10.70 cm/s LVOT diam:     2.00 cm   LV E/e' lateral:  9.4 LV SV:         93 LV SV Index:   45 LVOT Area:     3.14 cm  RIGHT VENTRICLE RV S prime:     15.50 cm/s TAPSE (M-mode): 1.7 cm LEFT ATRIUM             Index        RIGHT ATRIUM          Index LA diam:        3.20 cm 1.55 cm/m   RA Area:     9.46 cm LA Vol (A2C):   37.5 ml 18.15 ml/m  RA Volume:   16.00 ml 7.75 ml/m LA Vol (A4C):   50.2 ml 24.30 ml/m LA Biplane Vol: 44.3 ml 21.44 ml/m  AORTIC VALVE AV Area (Vmax):    2.38 cm AV Area (Vmean):   2.37 cm AV Area (VTI):     2.84 cm AV Vmax:           165.00 cm/s AV Vmean:          113.000 cm/s AV VTI:            0.327 m AV Peak Grad:      10.9 mmHg AV Mean Grad:      6.0 mmHg LVOT Vmax:         125.00 cm/s LVOT Vmean:        85.200 cm/s LVOT VTI:          0.296 m LVOT/AV VTI ratio: 0.91  AORTA Ao Root diam: 3.30 cm Ao Asc diam:  3.20 cm MITRAL VALVE                TRICUSPID VALVE MV Area (PHT): 3.48 cm     TR Peak grad:   20.8 mmHg MV Decel Time: 218 msec     TR Vmax:        228.00 cm/s MV E velocity: 101.00 cm/s MV A velocity: 116.00 cm/s  SHUNTS MV E/A ratio:  0.87         Systemic VTI:  0.30 m  Systemic Diam: 2.00 cm Kirk Ruths MD Electronically signed by Kirk Ruths MD Signature Date/Time: 12/14/2021/12:51:02 PM    Final    EEG adult  Result Date: 12/14/2021 Lora Havens, MD     12/14/2021  7:58 AM Patient Name: Kristin Aguirre MRN: [id number] Epilepsy Attending: Lora Havens Referring Physician/Provider: Shela Leff, MD Date: 12/14/2021 Duration: 21.14 mins Patient history: 61yo F with syncope. EEG to evaluate for seizure. Level of alertness: Awake, asleep AEDs during EEG study: None Technical aspects: This EEG study was done with scalp electrodes positioned according to the 10-20 International system of electrode placement. Electrical activity was reviewed with band pass filter of 1-70Hz , sensitivity of 7 uV/mm, display speed of 72mm/sec with a 60Hz  notched filter applied as appropriate. EEG data were  recorded continuously and digitally stored.  Video monitoring was available and reviewed as appropriate. Description: The posterior dominant rhythm consists of 8 Hz activity of moderate voltage (25-35 uV) seen predominantly in posterior head regions, symmetric and reactive to eye opening and eye closing.Sleep was characterized by vertex waves, maximal frontocentral region. Hyperventilation and photic stimulation were not performed.   IMPRESSION: This study is within normal limits. No seizures or epileptiform discharges were seen throughout the recording. A normal interictal EEG does not exclude nor support the diagnosis of epilepsy. Lora Havens   CT Angio Chest PE W and/or Wo Contrast  Result Date: 12/13/2021 CLINICAL DATA:  Pulmonary embolism (PE) suspected, positive D-dimer. Cardiopulmonary arrest EXAM: CT ANGIOGRAPHY CHEST WITH CONTRAST TECHNIQUE: Multidetector CT imaging of the chest was performed using the standard protocol during bolus administration of intravenous contrast. Multiplanar CT image reconstructions and MIPs were obtained to evaluate the vascular anatomy. RADIATION DOSE REDUCTION: This exam was performed according to the departmental dose-optimization program which includes automated exposure control, adjustment of the mA and/or kV according to patient size and/or use of iterative reconstruction technique. CONTRAST:  44mL OMNIPAQUE IOHEXOL 350 MG/ML SOLN COMPARISON:  04/11/2012 FINDINGS: Cardiovascular: Slightly suboptimal bolus timing and resultant opacification of the pulmonary arterial tree, however, the examination is still diagnostic for evaluation through the segmental level. No intraluminal filling defect identified to suggest acute pulmonary embolism. Central pulmonary arteries are of normal caliber. Mild coronary artery calcification. Global cardiac size within normal limits. No pericardial effusion. Mild atherosclerotic calcification within the thoracic aorta. No aortic aneurysm.  Aberrant origin of the right subclavian artery. Mediastinum/Nodes: Multiple bilateral thyroid nodules are identified measuring up to 23 mm within the right thyroid lobe, stable since prior examination and unlikely of clinical significance given their stability over time. No follow-up imaging is recommended for these lesions. No pathologic thoracic adenopathy. Esophagus unremarkable. Lungs/Pleura: Multifocal ground-glass pulmonary infiltrates are seen scattered throughout the lungs, more focally within the left lower lobe, possibly infectious or inflammatory in nature as can be seen with atypical infection. No pneumothorax or pleural effusion. Central airways are widely patent. Upper Abdomen: No acute abnormality. Musculoskeletal: No chest wall abnormality. No acute or significant osseous findings. Review of the MIP images confirms the above findings. IMPRESSION: 1. No pulmonary embolism. 2. Multifocal ground-glass pulmonary infiltrates, more focally within the left lower lobe, possibly infectious or inflammatory in nature as can be seen with atypical infection. 3. Mild coronary artery calcification. Aortic Atherosclerosis (ICD10-I70.0). Electronically Signed   By: Fidela Salisbury M.D.   On: 12/13/2021 21:08   CT Head Wo Contrast  Result Date: 12/13/2021 CLINICAL DATA:  Head trauma, moderate-severe. Loss of consciousness. Received CPR. EXAM: CT HEAD WITHOUT CONTRAST  TECHNIQUE: Contiguous axial images were obtained from the base of the skull through the vertex without intravenous contrast. RADIATION DOSE REDUCTION: This exam was performed according to the departmental dose-optimization program which includes automated exposure control, adjustment of the mA and/or kV according to patient size and/or use of iterative reconstruction technique. COMPARISON:  Head CT report from 07/21/1996 FINDINGS: Brain: There is no evidence of an acute infarct, intracranial hemorrhage, mass, midline shift, or extra-axial fluid  collection. The ventricles and sulci are normal. Vascular: No hyperdense vessel. Skull: No fracture or suspicious osseous lesion. Sinuses/Orbits: Visualized paranasal sinuses and mastoid air cells are clear. Unremarkable orbits. Other: None. IMPRESSION: Negative head CT. Electronically Signed   By: Sebastian Ache M.D.   On: 12/13/2021 21:02    Scheduled Meds:  enoxaparin (LOVENOX) injection  40 mg Subcutaneous QHS   [START ON 12/15/2021] influenza vac split quadrivalent PF  0.5 mL Intramuscular Tomorrow-1000   senna-docusate  1 tablet Oral QHS    Continuous Infusions:  cefTRIAXone (ROCEPHIN)  IV Stopped (12/14/21 0109)   doxycycline (VIBRAMYCIN) IV Stopped (12/14/21 0335)     LOS: 0 days     Darlin Drop, MD [facility] Pager [phone]  If 7PM-7AM, please contact night-coverage [email or web address] Password [email or web address] 12/14/2021, 4:10 PM

## 2021-12-14 NOTE — Evaluation (Signed)
Physical Therapy Evaluation Patient Details Name: Kristin Aguirre MRN: [id number] DOB: 09-May-1960 Today's Date: 12/14/2021  History of Present Illness  Pt is a 61 y/o female admitted secondary to syncopal episode. PMH includes HTN and smoker.  Clinical Impression  Pt admitted secondary to problem above with deficits below. Pt requiring min guard for mobility tasks. Pt with increased dizziness/lightheadedness after taking steps at EOB, so further mobility deferred. Anticipate pt will progress well once symptoms improve. Will continue to follow acutely to maximize functional mobility independence and safety.    12/14/21 1430 12/14/21 1435  Orthostatic Lying   BP- Lying 120/66 134/69  Orthostatic Sitting  BP- Sitting 149/74  --   Orthostatic Standing at 0 minutes  BP- Standing at 0 minutes 135/73  --   Orthostatic Standing at 3 minutes  BP- Standing at 3 minutes 146/74  --          Recommendations for follow up therapy are one component of a multi-disciplinary discharge planning process, led by the attending physician.  Recommendations may be updated based on patient status, additional functional criteria and insurance authorization.  Follow Up Recommendations No PT follow up (pending progression)      Assistance Recommended at Discharge Intermittent Supervision/Assistance  Patient can return home with the following  Assistance with cooking/housework;Assist for transportation;Help with stairs or ramp for entrance    Equipment Recommendations None recommended by PT  Recommendations for Other Services       Functional Status Assessment Patient has had a recent decline in their functional status and demonstrates the ability to make significant improvements in function in a reasonable and predictable amount of time.     Precautions / Restrictions Precautions Precautions: Fall Restrictions Weight Bearing Restrictions: No      Mobility  Bed Mobility Overal bed mobility: Needs  Assistance Bed Mobility: Supine to Sit, Sit to Supine     Supine to sit: Min guard Sit to supine: Min guard   General bed mobility comments: Min guard for safety. Pt having to return to supine after taking side steps secondary to dizziness.    Transfers Overall transfer level: Needs assistance Equipment used: 1 person hand held assist Transfers: Sit to/from Stand Sit to Stand: Min guard           General transfer comment: Min guard for safety.    Ambulation/Gait Ambulation/Gait assistance: Min guard Gait Distance (Feet): 1 Feet Assistive device: 1 person hand held assist Gait Pattern/deviations: Step-through pattern, Decreased stride length Gait velocity: Decreased     General Gait Details: Took steps at EOB, however, pt complaining of increased lightheadedness, so returned to supine. BP dropped from 146/74 in standing to 134/69 following ambulation.  Stairs            Wheelchair Mobility    Modified Rankin (Stroke Patients Only)       Balance Overall balance assessment: Mild deficits observed, not formally tested                                           Pertinent Vitals/Pain Pain Assessment Pain Assessment: Faces Faces Pain Scale: Hurts little more Pain Location: generalized Pain Descriptors / Indicators: Guarding Pain Intervention(s): Limited activity within patient's tolerance, Monitored during session, Repositioned    Home Living Family/patient expects to be discharged to:: Private residence Living Arrangements: Spouse/significant other Available Help at Discharge: Family Type of Home: House  Home Access: Stairs to enter Entrance Stairs-Rails: Right;Left;Can reach both Entrance Stairs-Number of Steps: 12   Home Layout: One level Home Equipment: Cane - single point      Prior Function Prior Level of Function : Independent/Modified Independent                     Hand Dominance        Extremity/Trunk  Assessment   Upper Extremity Assessment Upper Extremity Assessment: Defer to OT evaluation    Lower Extremity Assessment Lower Extremity Assessment: Generalized weakness    Cervical / Trunk Assessment Cervical / Trunk Assessment: Normal  Communication   Communication: No difficulties  Cognition Arousal/Alertness: Awake/alert Behavior During Therapy: WFL for tasks assessed/performed Overall Cognitive Status: Within Functional Limits for tasks assessed                                          General Comments      Exercises     Assessment/Plan    PT Assessment Patient needs continued PT services  PT Problem List Decreased strength;Decreased mobility;Decreased balance;Decreased activity tolerance       PT Treatment Interventions DME instruction;Gait training;Stair training;Functional mobility training;Balance training;Therapeutic exercise;Therapeutic activities;Patient/family education    PT Goals (Current goals can be found in the Care Plan section)  Acute Rehab PT Goals Patient Stated Goal: to go home PT Goal Formulation: With patient Time For Goal Achievement: 12/28/21 Potential to Achieve Goals: Good    Frequency Min 3X/week     Co-evaluation               AM-PAC PT "6 Clicks" Mobility  Outcome Measure Help needed turning from your back to your side while in a flat bed without using bedrails?: None Help needed moving from lying on your back to sitting on the side of a flat bed without using bedrails?: A Little Help needed moving to and from a bed to a chair (including a wheelchair)?: A Little Help needed standing up from a chair using your arms (e.g., wheelchair or bedside chair)?: A Little Help needed to walk in hospital room?: A Little Help needed climbing 3-5 steps with a railing? : A Little 6 Click Score: 19    End of Session   Activity Tolerance: Treatment limited secondary to medical complications (Comment) (dizziness,  lightheadedness) Patient left: in bed;with call bell/phone within reach;with bed alarm set;with family/visitor present Nurse Communication: Mobility status PT Visit Diagnosis: Unsteadiness on feet (R26.81);Muscle weakness (generalized) (M62.81)    Time: 9562-1308 PT Time Calculation (min) (ACUTE ONLY): 19 min   Charges:   PT Evaluation $PT Eval Low Complexity: 1 Low          Reuel Derby, PT, DPT  Acute Rehabilitation Services  Office: [phone]   Rudean Hitt 12/14/2021, 2:56 PM

## 2021-12-14 NOTE — ED Notes (Signed)
Pt asked this RN for Imitrex for migraine (home med) as well as medication for back pain as prn tylenol, administered at 0109 per [facility], has not provided relief. Rathore MD made aware. Awaiting response from provider.

## 2021-12-14 NOTE — Procedures (Signed)
Patient Name: PANAGIOTA PERFETTI  MRN: [id number]  Epilepsy Attending: Lora Havens  Referring Physician/Provider: Shela Leff, MD  Date: 12/14/2021 Duration: 21.14 mins  Patient history: 61yo F with syncope. EEG to evaluate for seizure.  Level of alertness: Awake, asleep  AEDs during EEG study: None  Technical aspects: This EEG study was done with scalp electrodes positioned according to the 10-20 International system of electrode placement. Electrical activity was reviewed with band pass filter of 1-70Hz , sensitivity of 7 uV/mm, display speed of 44mm/sec with a 60Hz  notched filter applied as appropriate. EEG data were recorded continuously and digitally stored.  Video monitoring was available and reviewed as appropriate.  Description: The posterior dominant rhythm consists of 8 Hz activity of moderate voltage (25-35 uV) seen predominantly in posterior head regions, symmetric and reactive to eye opening and eye closing.Sleep was characterized by vertex waves, maximal frontocentral region. Hyperventilation and photic stimulation were not performed.     IMPRESSION: This study is within normal limits. No seizures or epileptiform discharges were seen throughout the recording.  A normal interictal EEG does not exclude nor support the diagnosis of epilepsy.   Adit Riddles Barbra Sarks

## 2021-12-14 NOTE — ED Notes (Addendum)
Pt asleep, SpO2 88% on room air. Respirations even and unlabored. Pt placed on 2L O2 via [place]. SpO2 95% on 2L. Per pt, being tested for sleep apnea next week.

## 2021-12-14 NOTE — ED Notes (Signed)
Per Marlowe Sax MD request, floor coverage paged via Amion regarding pts medications. Awaiting response.

## 2021-12-14 NOTE — ED Notes (Addendum)
Dr. Marlowe Sax notified of delta troponin. Per provider, repeat troponin should be drawn 2 hrs post previous draw. Pt is currently asymptomatic, this RN will continue to monitor for any acute changes.

## 2021-12-15 DIAGNOSIS — J189 Pneumonia, unspecified organism: Secondary | ICD-10-CM

## 2021-12-15 DIAGNOSIS — K219 Gastro-esophageal reflux disease without esophagitis: Secondary | ICD-10-CM | POA: Diagnosis not present

## 2021-12-15 DIAGNOSIS — R55 Syncope and collapse: Secondary | ICD-10-CM | POA: Diagnosis not present

## 2021-12-15 DIAGNOSIS — F411 Generalized anxiety disorder: Secondary | ICD-10-CM

## 2021-12-15 DIAGNOSIS — E7800 Pure hypercholesterolemia, unspecified: Secondary | ICD-10-CM

## 2021-12-15 LAB — BASIC METABOLIC PANEL
Anion gap: 8 (ref 5–15)
BUN: 16 mg/dL (ref 8–23)
CO2: 28 mmol/L (ref 22–32)
Calcium: 8.8 mg/dL — ABNORMAL LOW (ref 8.9–10.3)
Chloride: 105 mmol/L (ref 98–111)
Creatinine, Ser: 0.83 mg/dL (ref 0.44–1.00)
GFR, Estimated: >60 mL/min (ref >60)
Glucose, Bld: 103 mg/dL — ABNORMAL HIGH (ref 70–99)
Potassium: 4.1 mmol/L (ref 3.5–5.1)
Sodium: 141 mmol/L (ref 135–145)

## 2021-12-15 LAB — CBC
HCT: 31 % — ABNORMAL LOW (ref 36.0–46.0)
Hemoglobin: 10.1 g/dL — ABNORMAL LOW (ref 12.0–15.0)
MCH: 29.1 pg (ref 26.0–34.0)
MCHC: 32.6 g/dL (ref 30.0–36.0)
MCV: 89.3 fL (ref 80.0–100.0)
Platelets: 201 10*3/uL (ref 150–400)
RBC: 3.47 MIL/uL — ABNORMAL LOW (ref 3.87–5.11)
RDW: 13 % (ref 11.5–15.5)
WBC: 7.8 10*3/uL (ref 4.0–10.5)
nRBC: 0 % (ref 0.0–0.2)

## 2021-12-15 MED ORDER — ASPIRIN 81 MG PO TBEC
81.0000 mg | DELAYED_RELEASE_TABLET | Freq: Every day | ORAL | Status: DC
  Administered 2021-12-15 – 2021-12-17 (×3): 81 mg via ORAL
  Filled 2021-12-15 (×3): qty 1

## 2021-12-15 MED ORDER — ENSURE ENLIVE PO LIQD
237.0000 mL | Freq: Two times a day (BID) | ORAL | Status: DC
  Administered 2021-12-16: 237 mL via ORAL

## 2021-12-15 MED ORDER — CITALOPRAM HYDROBROMIDE 20 MG PO TABS
20.0000 mg | ORAL_TABLET | Freq: Every day | ORAL | Status: DC
  Administered 2021-12-16: 20 mg via ORAL
  Filled 2021-12-15: qty 1

## 2021-12-15 MED ORDER — ONDANSETRON HCL 4 MG/2ML IJ SOLN
4.0000 mg | Freq: Four times a day (QID) | INTRAMUSCULAR | Status: DC | PRN
  Administered 2021-12-15: 4 mg via INTRAVENOUS
  Filled 2021-12-15: qty 2

## 2021-12-15 MED ORDER — OXYCODONE-ACETAMINOPHEN 5-325 MG PO TABS
1.0000 | ORAL_TABLET | Freq: Three times a day (TID) | ORAL | Status: DC | PRN
  Administered 2021-12-16 – 2021-12-17 (×3): 1 via ORAL
  Filled 2021-12-15 (×3): qty 1

## 2021-12-15 MED ORDER — OXYCODONE-ACETAMINOPHEN 7.5-325 MG PO TABS: 1.0000 | ORAL_TABLET | Freq: Three times a day (TID) | ORAL | Status: DC | PRN

## 2021-12-15 MED ORDER — ROSUVASTATIN CALCIUM 5 MG PO TABS
10.0000 mg | ORAL_TABLET | Freq: Every day | ORAL | Status: DC
  Administered 2021-12-15 – 2021-12-17 (×3): 10 mg via ORAL
  Filled 2021-12-15 (×3): qty 2

## 2021-12-15 MED ORDER — NICOTINE 14 MG/24HR TD PT24
14.0000 mg | MEDICATED_PATCH | Freq: Every day | TRANSDERMAL | Status: DC
  Administered 2021-12-15 – 2021-12-17 (×3): 14 mg via TRANSDERMAL
  Filled 2021-12-15 (×3): qty 1

## 2021-12-15 MED ORDER — ALPRAZOLAM 0.5 MG PO TABS
0.5000 mg | ORAL_TABLET | Freq: Two times a day (BID) | ORAL | Status: DC | PRN
  Administered 2021-12-15 – 2021-12-16 (×2): 0.5 mg via ORAL
  Filled 2021-12-15 (×2): qty 1

## 2021-12-15 MED ORDER — DOXYCYCLINE HYCLATE 100 MG PO TABS
100.0000 mg | ORAL_TABLET | Freq: Two times a day (BID) | ORAL | Status: DC
  Administered 2021-12-15 – 2021-12-17 (×4): 100 mg via ORAL
  Filled 2021-12-15 (×4): qty 1

## 2021-12-15 MED ORDER — CITALOPRAM HYDROBROMIDE 20 MG PO TABS
40.0000 mg | ORAL_TABLET | Freq: Every day | ORAL | Status: DC
  Administered 2021-12-15: 40 mg via ORAL
  Filled 2021-12-15: qty 2

## 2021-12-15 NOTE — Evaluation (Signed)
Occupational Therapy Evaluation Patient Details Name: Kristin Aguirre MRN: [id number] DOB: 1960-07-19 Today's Date: 12/15/2021   History of Present Illness Pt is a 62 y/o female admitted secondary to syncopal episode. PMH includes HTN and smoker.   Clinical Impression   Kristin Aguirre was evaluated s/p the above admission list, she is typically indep at baseline including workign and driving. Upon evaluation she had mild fusional deficits due to generalized weakness and decreased activity tolerance. Overall seh was supervision A for all transfer, mobility and ADL at the sink without AD. BP taken throughout an negative for OH. OT to continue to follow acutely. Recommend OOB for toileting and frequent ambulation with staff. No OT follow up needed.   Supine 129/66 (84) Sitting 148/93 (110) Standing 138/79 (96) Standing 3 minutes  146/81 (99)     Recommendations for follow up therapy are one component of a multi-disciplinary discharge planning process, led by the attending physician.  Recommendations may be updated based on patient status, additional functional criteria and insurance authorization.   Follow Up Recommendations  No OT follow up    Assistance Recommended at Discharge Intermittent Supervision/Assistance  Patient can return home with the following A little help with walking and/or transfers;A little help with bathing/dressing/bathroom;Assistance with cooking/housework;Assist for transportation;Help with stairs or ramp for entrance    Functional Status Assessment  Patient has had a recent decline in their functional status and demonstrates the ability to make significant improvements in function in a reasonable and predictable amount of time.  Equipment Recommendations  None recommended by OT       Precautions / Restrictions Precautions Precautions: Fall Restrictions Weight Bearing Restrictions: No      Mobility Bed Mobility Overal bed mobility: Needs Assistance Bed Mobility:  Supine to Sit, Sit to Supine     Supine to sit: Supervision Sit to supine: Supervision   General bed mobility comments: no physical assisted needed. HOB flat    Transfers Overall transfer level: Needs assistance Equipment used: None Transfers: Sit to/from Stand Sit to Stand: Supervision                  Balance Overall balance assessment: Mild deficits observed, not formally tested                                         ADL either performed or assessed with clinical judgement   ADL Overall ADL's : Needs assistance/impaired Eating/Feeding: Independent;Sitting   Grooming: Supervision/safety;Standing   Upper Body Bathing: Supervision/ safety;Standing Upper Body Bathing Details (indicate cue type and reason): at the sink Lower Body Bathing: Supervison/ safety;Sit to/from stand   Upper Body Dressing : Set up;Sitting   Lower Body Dressing: Supervision/safety;Sit to/from stand   Toilet Transfer: Supervision/safety;Ambulation   Toileting- Clothing Manipulation and Hygiene: Supervision/safety;Sitting/lateral lean       Functional mobility during ADLs: Supervision/safety General ADL Comments: no physical assist needed. increased time and cues for safety. Pt completed ADLs while standing at the sink, no sitting Rest break needed     Vision Baseline Vision/History: 1 Wears glasses Ability to See in Adequate Light: 0 Adequate Patient Visual Report: No change from baseline Vision Assessment?: No apparent visual deficits     Perception     Praxis      Pertinent Vitals/Pain Pain Assessment Pain Assessment: Faces Faces Pain Scale: Hurts little more Pain Location: headache Pain Descriptors / Indicators: Discomfort Pain Intervention(s):  Monitored during session     Hand Dominance Right   Extremity/Trunk Assessment Upper Extremity Assessment Upper Extremity Assessment: Generalized weakness   Lower Extremity Assessment Lower Extremity  Assessment: Generalized weakness   Cervical / Trunk Assessment Cervical / Trunk Assessment: Normal   Communication Communication Communication: No difficulties   Cognition Arousal/Alertness: Awake/alert Behavior During Therapy: WFL for tasks assessed/performed Overall Cognitive Status: Within Functional Limits for tasks assessed                                 General Comments: Reports she feels "out of it" and "weak" - likely due to medication     General Comments  VSS on RA, multiple BPs taken, negative orthostatics    Exercises     Shoulder Instructions      Home Living Family/patient expects to be discharged to:: Private residence Living Arrangements: Spouse/significant other Available Help at Discharge: Family Type of Home: House Home Access: Stairs to enter Technical brewer of Steps: 12 Entrance Stairs-Rails: Right;Left;Can reach both Home Layout: One level     Bathroom Shower/Tub: Teacher, early years/pre: Standard     Home Equipment: Cane - single point          Prior Functioning/Environment Prior Level of Function : Independent/Modified Independent;Driving;Working/employed             Mobility Comments: no AD ADLs Comments: Works full time, drives        OT Problem List: Decreased activity tolerance;Decreased cognition;Decreased safety awareness      OT Treatment/Interventions: Self-care/ADL training;Therapeutic exercise;DME and/or AE instruction;Therapeutic activities;Patient/family education;Balance training    OT Goals(Current goals can be found in the care plan section) Acute Rehab OT Goals Patient Stated Goal: home OT Goal Formulation: With patient Time For Goal Achievement: 12/29/21 Potential to Achieve Goals: Good ADL Goals Additional ADL Goal #1: Pt will indep complete ADLs with VSS on RA  OT Frequency: Min 2X/week    Co-evaluation              AM-PAC OT "6 Clicks" Daily Activity     Outcome  Measure Help from another person eating meals?: None Help from another person taking care of personal grooming?: A Little Help from another person toileting, which includes using toliet, bedpan, or urinal?: A Little Help from another person bathing (including washing, rinsing, drying)?: A Little Help from another person to put on and taking off regular upper body clothing?: None Help from another person to put on and taking off regular lower body clothing?: A Little 6 Click Score: 20   End of Session Equipment Utilized During Treatment: Gait belt Nurse Communication: Mobility status  Activity Tolerance: Patient tolerated treatment well Patient left: in chair;with call bell/phone within reach (OOB for toileting)  OT Visit Diagnosis: Muscle weakness (generalized) (M62.81);Unsteadiness on feet (R26.81)                Time: [id number] OT Time Calculation (min): 20 min Charges:  OT General Charges $OT Visit: 1 Visit OT Evaluation $OT Eval Moderate Complexity: 1 Mod    Avanna Sowder D Causey 12/15/2021, 11:32 AM

## 2021-12-15 NOTE — Progress Notes (Signed)
PROGRESS NOTE    Kristin Aguirre  ANV:[id number] DOB: [birth year] DOA: 12/13/2021 PCP: [facility]   Brief Narrative:  61 y.o. Caucasian female  with hypertension, hyperlipidemia, GERD, former cigarette smoker, currently vapes incessantly (sleeps with vape in her hand), ongoing workup for possible OSA, admitted with possible syncope, questionable brief cardiac arrest, troponin elevation.  Found unresponsive by her coworker.  Coworkers could not find a pulse, called EMS and performed brief CPR. When EMS arrived, patient was reportedly cyanotic, but had a pulse. She was brought to [facility] ER, and was alert by then. She denied chest pain, shortness of breath at any time. She was given IV fluids.    Workup showed mildly elevated d-Dimer, CTA with no PE, mild coronary calcification, normal echocardiogram, HS trip 15-->38-->33.  Assessment & Plan:   Syncope: -Could be due to orthostatic hypotension?  CTA chest negative for PE.  CT head negative for acute finding and neuro exam nonfocal.  Troponin negative.  EEG negative for seizures.  Echo: Normal ejection fraction with no wall motion abnormality.  UDS negative.  Evaluated by cardiology-low suspicion for neurogenic syncope.  Plan is to arrange outpatient cardiac telemetry to be placed on 10/2. -Orthostatic vitals positive.  Continue PT/OT recommended no PT/OT -Avoid opioid use  CAP? -She remained afebrile with no leukocytosis.  No lactic acidosis or hypoxia.  CT showing multiple groundglass pulmonary infiltrates more focally within left lower lobe, possibly infectious or inflammatory.   -She is currently on Rocephin and doxycycline.  She is tested negative for COVID.  Hypertension:  -Hold home BP meds at this time due to positive orthostatics -Continue to monitor blood pressure closely  Depression with anxiety: Continue home meds Celexa and Xanax as needed  Generalized osteoarthritis: Complaining of right knee pain and hand  joint pain.  Tells me that she has a history of rheumatoid arthritis and takes Percocet at home prescribed by her PCP.  Will try to avoid opioids as much as possible.  Hyperlipidemia: Continue statins  Nicotine-containing vaping: Recommend cessation  Obesity with BMI of 34: Diet modification, exercise and weight loss recommended  Normocytic anemia: H&H is at baseline.  Continue to monitor  DVT prophylaxis: Lovenox Code Status: Full code Family Communication:  None present at bedside.  Plan of care discussed with patient in length and she verbalized understanding and agreed with it. Disposition Plan: Likely home in 1 to 2 days  Consultants:  Cardiology Neurology  Procedures:  EEG  Antimicrobials:  Rocephin Doxycycline  Status is: Inpatient    Subjective: Seen and examined.  Resting comfortably on the bed.  Reports pain in right knee and pain in both hand joints which is chronic and takes Percocet at home.  She is still feels dizzy and awful.  No acute events overnight.  Objective: Vitals:   12/14/21 1300 12/14/21 1339 12/14/21 2105 12/15/21 0030  BP: 118/67 133/62 129/67 (!) 155/79  Pulse: 77 74 80 69  Resp: 18 18 18 18   Temp: 98.5 F (36.9 C) 98.5 F (36.9 C) 98.3 F (36.8 C) 98.3 F (36.8 C)  TempSrc: Oral Oral Oral Oral  SpO2: 99% 97% 98% 98%  Weight:  105.3 kg    Height:  5\' 9"  (1.753 m)      Intake/Output Summary (Last 24 hours) at 12/15/2021 1203 Last data filed at 12/15/2021 0854 Gross per 24 hour  Intake 791.97 ml  Output --  Net 791.97 ml   Filed Weights   12/13/21 1809 12/14/21  1339  Weight: 90.7 kg 105.3 kg    Examination:  General exam: Appears calm and comfortable, on room air, communicating well Respiratory system: Clear to auscultation. Respiratory effort normal. Cardiovascular system: S1 & S2 heard, RRR. No JVD, murmurs, rubs, gallops or clicks. No pedal edema. Gastrointestinal system: Abdomen is nondistended, soft and nontender. No  organomegaly or masses felt. Normal bowel sounds heard. Central nervous system: Alert and oriented. No focal neurological deficits. Extremities: Symmetric 5 x 5 power. Skin: No rashes, lesions or ulcers Psychiatry: Judgement and insight appear normal. Mood & affect appropriate.    Data Reviewed: I have personally reviewed following labs and imaging studies  CBC: Recent Labs  Lab 12/13/21 1811 12/15/21 0204  WBC 9.8 7.8  NEUTROABS 7.2  --   HGB 11.3* 10.1*  HCT 35.5* 31.0*  MCV 90.8 89.3  PLT 254 016   Basic Metabolic Panel: Recent Labs  Lab 12/13/21 1811 12/15/21 0204  NA 140 141  K 4.4 4.1  CL 104 105  CO2 24 28  GLUCOSE 106* 103*  BUN 18 16  CREATININE 0.59 0.83  CALCIUM 9.1 8.8*   GFR: Estimated Creatinine Clearance: 90.8 mL/min (by C-G formula based on SCr of 0.83 mg/dL). Liver Function Tests: Recent Labs  Lab 12/13/21 1811  AST 18  ALT 18  ALKPHOS 70  BILITOT 0.2*  PROT 7.4  ALBUMIN 3.8   Recent Labs  Lab 12/13/21 1811  LIPASE 29   No results for input(s): "AMMONIA" in the last 168 hours. Coagulation Profile: No results for input(s): "INR", "PROTIME" in the last 168 hours. Cardiac Enzymes: No results for input(s): "CKTOTAL", "CKMB", "CKMBINDEX", "TROPONINI" in the last 168 hours. BNP (last 3 results) No results for input(s): "PROBNP" in the last 8760 hours. HbA1C: No results for input(s): "HGBA1C" in the last 72 hours. CBG: No results for input(s): "GLUCAP" in the last 168 hours. Lipid Profile: No results for input(s): "CHOL", "HDL", "LDLCALC", "TRIG", "CHOLHDL", "LDLDIRECT" in the last 72 hours. Thyroid Function Tests: No results for input(s): "TSH", "T4TOTAL", "FREET4", "T3FREE", "THYROIDAB" in the last 72 hours. Anemia Panel: No results for input(s): "VITAMINB12", "FOLATE", "FERRITIN", "TIBC", "IRON", "RETICCTPCT" in the last 72 hours. Sepsis Labs: Recent Labs  Lab 12/13/21 1826 12/14/21 0015 12/14/21 0225  PROCALCITON  --   --   <0.10  LATICACIDVEN 1.6 1.6  --     Recent Results (from the past 240 hour(s))  SARS Coronavirus 2 by RT PCR (hospital order, performed in [facility] hospital lab) *cepheid single result test* Anterior Nasal Swab     Status: None   Collection Time: 12/13/21 10:09 PM   Specimen: Anterior Nasal Swab  Result Value Ref Range Status   SARS Coronavirus 2 by RT PCR NEGATIVE NEGATIVE Final    Comment: (NOTE) SARS-CoV-2 target nucleic acids are NOT DETECTED.  The SARS-CoV-2 RNA is generally detectable in upper and lower respiratory specimens during the acute phase of infection. The lowest concentration of SARS-CoV-2 viral copies this assay can detect is 250 copies / mL. A negative result does not preclude SARS-CoV-2 infection and should not be used as the sole basis for treatment or other patient management decisions.  A negative result may occur with improper specimen collection / handling, submission of specimen other than nasopharyngeal swab, presence of viral mutation(s) within the areas targeted by this assay, and inadequate number of viral copies (<250 copies / mL). A negative result must be combined with clinical observations, patient history, and epidemiological information.  Fact Sheet  for Patients:   RoadLapTop.co.za  Fact Sheet for Healthcare Providers: http://kim-miller.com/  This test is not yet approved or  cleared by the [place] FDA and has been authorized for detection and/or diagnosis of SARS-CoV-2 by FDA under an Emergency Use Authorization (EUA).  This EUA will remain in effect (meaning this test can be used) for the duration of the COVID-19 declaration under Section 564(b)(1) of the Act, 21 U.S.C. section 360bbb-3(b)(1), unless the authorization is terminated or revoked sooner.  Performed at [facility], [address] [place], [place] [address]       Radiology Studies: ECHOCARDIOGRAM  COMPLETE  Result Date: 12/14/2021    ECHOCARDIOGRAM REPORT   Patient Name:   Kristin Aguirre Date of Exam: 12/14/2021 Medical Rec #:  [id number]      Height:       69.0 in Accession #:    [id number]     Weight:       200.0 lb Date of Birth:  [birth year]      BSA:          2.066 m Patient Age:    61 years       BP:           133/73 mmHg Patient Gender: F              HR:           72 bpm. Exam Location:  Inpatient Procedure: 2D Echo, Color Doppler and Cardiac Doppler Indications:    Syncope  History:        Patient has no prior history of Echocardiogram examinations.                 Risk Factors:Dyslipidemia.  Sonographer:    Gaynell Face Referring Phys: [id number] VASUNDHRA RATHORE IMPRESSIONS  1. Left ventricular ejection fraction, by estimation, is 60 to 65%. The left ventricle has normal function. The left ventricle has no regional wall motion abnormalities. Left ventricular diastolic parameters were normal.  2. Right ventricular systolic function is normal. The right ventricular size is normal. Tricuspid regurgitation signal is inadequate for assessing PA pressure.  3. The mitral valve is normal in structure. No evidence of mitral valve regurgitation. No evidence of mitral stenosis.  4. The aortic valve is tricuspid. Aortic valve regurgitation is not visualized. No aortic stenosis is present.  5. The inferior vena cava is normal in size with greater than 50% respiratory variability, suggesting right atrial pressure of 3 mmHg. Comparison(s): No prior Echocardiogram. FINDINGS  Left Ventricle: Left ventricular ejection fraction, by estimation, is 60 to 65%. The left ventricle has normal function. The left ventricle has no regional wall motion abnormalities. The left ventricular internal cavity size was normal in size. There is  no left ventricular hypertrophy. Left ventricular diastolic parameters were normal. Right Ventricle: The right ventricular size is normal. Right ventricular systolic function is normal.  Tricuspid regurgitation signal is inadequate for assessing PA pressure. The tricuspid regurgitant velocity is 2.28 m/s, and with an assumed right atrial  pressure of 3 mmHg, the estimated right ventricular systolic pressure is 23.8 mmHg. Left Atrium: Left atrial size was normal in size. Right Atrium: Right atrial size was normal in size. Pericardium: There is no evidence of pericardial effusion. Mitral Valve: The mitral valve is normal in structure. No evidence of mitral valve regurgitation. No evidence of mitral valve stenosis. Tricuspid Valve: The tricuspid valve is normal in structure. Tricuspid valve regurgitation is trivial. No evidence of tricuspid stenosis.  Aortic Valve: The aortic valve is tricuspid. Aortic valve regurgitation is not visualized. No aortic stenosis is present. Aortic valve mean gradient measures 6.0 mmHg. Aortic valve peak gradient measures 10.9 mmHg. Aortic valve area, by VTI measures 2.84  cm. Pulmonic Valve: The pulmonic valve was normal in structure. Pulmonic valve regurgitation is not visualized. No evidence of pulmonic stenosis. Aorta: The aortic root is normal in size and structure. Venous: The inferior vena cava is normal in size with greater than 50% respiratory variability, suggesting right atrial pressure of 3 mmHg. IAS/Shunts: No atrial level shunt detected by color flow Doppler.  LEFT VENTRICLE PLAX 2D LVIDd:         4.40 cm   Diastology LVIDs:         2.90 cm   LV e' medial:    10.60 cm/s LV PW:         1.00 cm   LV E/e' medial:  9.5 LV IVS:        1.20 cm   LV e' lateral:   10.70 cm/s LVOT diam:     2.00 cm   LV E/e' lateral: 9.4 LV SV:         93 LV SV Index:   45 LVOT Area:     3.14 cm  RIGHT VENTRICLE RV S prime:     15.50 cm/s TAPSE (M-mode): 1.7 cm LEFT ATRIUM             Index        RIGHT ATRIUM          Index LA diam:        3.20 cm 1.55 cm/m   RA Area:     9.46 cm LA Vol (A2C):   37.5 ml 18.15 ml/m  RA Volume:   16.00 ml 7.75 ml/m LA Vol (A4C):   50.2 ml 24.30  ml/m LA Biplane Vol: 44.3 ml 21.44 ml/m  AORTIC VALVE AV Area (Vmax):    2.38 cm AV Area (Vmean):   2.37 cm AV Area (VTI):     2.84 cm AV Vmax:           165.00 cm/s AV Vmean:          113.000 cm/s AV VTI:            0.327 m AV Peak Grad:      10.9 mmHg AV Mean Grad:      6.0 mmHg LVOT Vmax:         125.00 cm/s LVOT Vmean:        85.200 cm/s LVOT VTI:          0.296 m LVOT/AV VTI ratio: 0.91  AORTA Ao Root diam: 3.30 cm Ao Asc diam:  3.20 cm MITRAL VALVE                TRICUSPID VALVE MV Area (PHT): 3.48 cm     TR Peak grad:   20.8 mmHg MV Decel Time: 218 msec     TR Vmax:        228.00 cm/s MV E velocity: 101.00 cm/s MV A velocity: 116.00 cm/s  SHUNTS MV E/A ratio:  0.87         Systemic VTI:  0.30 m                             Systemic Diam: 2.00 cm Olga Millers MD Electronically signed by Olga Millers MD Signature Date/Time: 12/14/2021/12:51:02  PM    Final    EEG adult  Result Date: 12/14/2021 Charlsie Quest, MD     12/14/2021  7:58 AM Patient Name: Kristin Aguirre MRN: [id number] Epilepsy Attending: Charlsie Quest Referring Physician/Provider: John Giovanni, MD Date: 12/14/2021 Duration: 21.14 mins Patient history: 61yo F with syncope. EEG to evaluate for seizure. Level of alertness: Awake, asleep AEDs during EEG study: None Technical aspects: This EEG study was done with scalp electrodes positioned according to the 10-20 International system of electrode placement. Electrical activity was reviewed with band pass filter of 1-70Hz , sensitivity of 7 uV/mm, display speed of 83mm/sec with a 60Hz  notched filter applied as appropriate. EEG data were recorded continuously and digitally stored.  Video monitoring was available and reviewed as appropriate. Description: The posterior dominant rhythm consists of 8 Hz activity of moderate voltage (25-35 uV) seen predominantly in posterior head regions, symmetric and reactive to eye opening and eye closing.Sleep was characterized by vertex waves, maximal  frontocentral region. Hyperventilation and photic stimulation were not performed.   IMPRESSION: This study is within normal limits. No seizures or epileptiform discharges were seen throughout the recording. A normal interictal EEG does not exclude nor support the diagnosis of epilepsy.   CT Angio Chest PE W and/or Wo Contrast  Result Date: 12/13/2021 CLINICAL DATA:  Pulmonary embolism (PE) suspected, positive D-dimer. Cardiopulmonary arrest EXAM: CT ANGIOGRAPHY CHEST WITH CONTRAST TECHNIQUE: Multidetector CT imaging of the chest was performed using the standard protocol during bolus administration of intravenous contrast. Multiplanar CT image reconstructions and MIPs were obtained to evaluate the vascular anatomy. RADIATION DOSE REDUCTION: This exam was performed according to the departmental dose-optimization program which includes automated exposure control, adjustment of the mA and/or kV according to patient size and/or use of iterative reconstruction technique. CONTRAST:  56mL OMNIPAQUE IOHEXOL 350 MG/ML SOLN COMPARISON:  04/11/2012 FINDINGS: Cardiovascular: Slightly suboptimal bolus timing and resultant opacification of the pulmonary arterial tree, however, the examination is still diagnostic for evaluation through the segmental level. No intraluminal filling defect identified to suggest acute pulmonary embolism. Central pulmonary arteries are of normal caliber. Mild coronary artery calcification. Global cardiac size within normal limits. No pericardial effusion. Mild atherosclerotic calcification within the thoracic aorta. No aortic aneurysm. Aberrant origin of the right subclavian artery. Mediastinum/Nodes: Multiple bilateral thyroid nodules are identified measuring up to 23 mm within the right thyroid lobe, stable since prior examination and unlikely of clinical significance given their stability over time. No follow-up imaging is recommended for these lesions. No pathologic thoracic  adenopathy. Esophagus unremarkable. Lungs/Pleura: Multifocal ground-glass pulmonary infiltrates are seen scattered throughout the lungs, more focally within the left lower lobe, possibly infectious or inflammatory in nature as can be seen with atypical infection. No pneumothorax or pleural effusion. Central airways are widely patent. Upper Abdomen: No acute abnormality. Musculoskeletal: No chest wall abnormality. No acute or significant osseous findings. Review of the MIP images confirms the above findings. IMPRESSION: 1. No pulmonary embolism. 2. Multifocal ground-glass pulmonary infiltrates, more focally within the left lower lobe, possibly infectious or inflammatory in nature as can be seen with atypical infection. 3. Mild coronary artery calcification. Aortic Atherosclerosis (ICD10-I70.0). Electronically Signed   By: 04/13/2012 M.D.   On: 12/13/2021 21:08   CT Head Wo Contrast  Result Date: 12/13/2021 CLINICAL DATA:  Head trauma, moderate-severe. Loss of consciousness. Received CPR. EXAM: CT HEAD WITHOUT CONTRAST TECHNIQUE: Contiguous axial images were obtained from the base of the skull through the vertex without  intravenous contrast. RADIATION DOSE REDUCTION: This exam was performed according to the departmental dose-optimization program which includes automated exposure control, adjustment of the mA and/or kV according to patient size and/or use of iterative reconstruction technique. COMPARISON:  Head CT report from 07/21/1996 FINDINGS: Brain: There is no evidence of an acute infarct, intracranial hemorrhage, mass, midline shift, or extra-axial fluid collection. The ventricles and sulci are normal. Vascular: No hyperdense vessel. Skull: No fracture or suspicious osseous lesion. Sinuses/Orbits: Visualized paranasal sinuses and mastoid air cells are clear. Unremarkable orbits. Other: None. IMPRESSION: Negative head CT. Electronically Signed   By: Sebastian Ache M.D.   On: 12/13/2021 21:02    Scheduled  Meds:  citalopram  40 mg Oral QHS   enoxaparin (LOVENOX) injection  40 mg Subcutaneous QHS   influenza vac split quadrivalent PF  0.5 mL Intramuscular Tomorrow-1000   senna-docusate  1 tablet Oral QHS   Continuous Infusions:  cefTRIAXone (ROCEPHIN)  IV Stopped (12/14/21 2324)   doxycycline (VIBRAMYCIN) IV Stopped (12/15/21 0212)     LOS: 1 day   Time spent: 35 minutes   Keisy Strickler Estill Cotta, MD [facility]  If 7PM-7AM, please contact night-coverage [email or web address] 12/15/2021, 12:03 PM

## 2021-12-15 NOTE — Progress Notes (Signed)
   12/15/21 1502  Mobility  Activity Ambulated with assistance in hallway  Level of Assistance Standby assist, set-up cues, supervision of patient - no hands on  Assistive Device  (IV Pole)  Distance Ambulated (ft) 150 ft  Activity Response Tolerated well  $Mobility charge 1 Mobility   Mobility Specialist Progress Note  Received pt in bed having no complaints and agreeable to mobility. Pt was asymptomatic throughout ambulation and returned to room w/o fault. Left in bed w/ call bell in reach and all needs met.  Lucious Groves Mobility Specialist

## 2021-12-16 DIAGNOSIS — F411 Generalized anxiety disorder: Secondary | ICD-10-CM | POA: Diagnosis not present

## 2021-12-16 DIAGNOSIS — E7800 Pure hypercholesterolemia, unspecified: Secondary | ICD-10-CM | POA: Diagnosis not present

## 2021-12-16 DIAGNOSIS — R55 Syncope and collapse: Secondary | ICD-10-CM | POA: Diagnosis not present

## 2021-12-16 DIAGNOSIS — J189 Pneumonia, unspecified organism: Secondary | ICD-10-CM | POA: Diagnosis not present

## 2021-12-16 LAB — BASIC METABOLIC PANEL
Anion gap: 8 (ref 5–15)
BUN: 19 mg/dL (ref 8–23)
CO2: 27 mmol/L (ref 22–32)
Calcium: 8.9 mg/dL (ref 8.9–10.3)
Chloride: 104 mmol/L (ref 98–111)
Creatinine, Ser: 0.79 mg/dL (ref 0.44–1.00)
GFR, Estimated: >60 mL/min (ref >60)
Glucose, Bld: 97 mg/dL (ref 70–99)
Potassium: 3.6 mmol/L (ref 3.5–5.1)
Sodium: 139 mmol/L (ref 135–145)

## 2021-12-16 LAB — CBC
HCT: 30.6 % — ABNORMAL LOW (ref 36.0–46.0)
Hemoglobin: 10.1 g/dL — ABNORMAL LOW (ref 12.0–15.0)
MCH: 29.2 pg (ref 26.0–34.0)
MCHC: 33 g/dL (ref 30.0–36.0)
MCV: 88.4 fL (ref 80.0–100.0)
Platelets: 228 10*3/uL (ref 150–400)
RBC: 3.46 MIL/uL — ABNORMAL LOW (ref 3.87–5.11)
RDW: 12.8 % (ref 11.5–15.5)
WBC: 9.5 10*3/uL (ref 4.0–10.5)
nRBC: 0 % (ref 0.0–0.2)

## 2021-12-16 LAB — MAGNESIUM: Magnesium: 1.9 mg/dL (ref 1.7–2.4)

## 2021-12-16 MED ORDER — PNEUMOCOCCAL 20-VAL CONJ VACC 0.5 ML IM SUSY
0.5000 mL | PREFILLED_SYRINGE | INTRAMUSCULAR | Status: AC
  Administered 2021-12-17: 0.5 mL via INTRAMUSCULAR
  Filled 2021-12-16: qty 0.5

## 2021-12-16 NOTE — Progress Notes (Signed)
PROGRESS NOTE    Kristin Aguirre  CLE:[id number] DOB: 1960-04-18 DOA: 12/13/2021 PCP: [facility]   Brief Narrative:  61 y.o. Caucasian female  with hypertension, hyperlipidemia, GERD, former cigarette smoker, currently vapes incessantly (sleeps with vape in her hand), ongoing workup for possible OSA, admitted with possible syncope, questionable brief cardiac arrest, troponin elevation.  Found unresponsive by her coworker.  Coworkers could not find a pulse, called EMS and performed brief CPR. When EMS arrived, patient was reportedly cyanotic, but had a pulse. She was brought to [facility] ER, and was alert by then. She denied chest pain, shortness of breath at any time. She was given IV fluids.    Workup showed mildly elevated d-Dimer, CTA with no PE, mild coronary calcification, normal echocardiogram, HS trip 15-->38-->33.  Assessment & Plan:   Syncope: -Could be due to orthostatic hypotension?  CTA chest negative for PE.  CT head negative for acute finding and neuro exam nonfocal.  Troponin negative.  EEG negative for seizures.  Echo: Normal ejection fraction with no wall motion abnormality.  UDS negative.  Evaluated by cardiology-low suspicion for cardiogenic  syncope.  Plan is to arrange outpatient cardiac telemetry to be placed on 10/2. -Orthostatic vitals positive on admission.  Continue PT/OT recommended no PT/OT -Avoid opioid use  CAP? -She remained afebrile with no leukocytosis.  No lactic acidosis or hypoxia.  CT showing multiple groundglass pulmonary infiltrates more focally within left lower lobe, possibly infectious or inflammatory.   -She is currently on Rocephin and doxycycline.  She is tested negative for COVID.  Hypertension:  -Hold home BP meds at this time due to positive orthostatics -Continue to monitor blood pressure closely  Depression with anxiety: Continue home meds Celexa and Xanax as needed  Generalized osteoarthritis: Complaining of right knee  pain and hand joint pain.  Tells me that she has a history of rheumatoid arthritis and takes Percocet at home prescribed by her PCP.  Will try to avoid opioids as much as possible.  Hyperlipidemia: Continue statins  Nicotine-containing vaping: Recommend cessation.  Nicotine patch ordered  Obesity with BMI of 34: Diet modification, exercise and weight loss recommended  Normocytic anemia: H&H is at baseline.  Continue to monitor  DVT prophylaxis: Lovenox Code Status: Full code Family Communication:  None present at bedside.  Plan of care discussed with patient in length and she verbalized understanding and agreed with it. Disposition Plan: Likely home in 1 to 2 days  Consultants:  Cardiology Neurology  Procedures:  EEG  Antimicrobials:  Rocephin Doxycycline  Status is: Inpatient    Subjective: Patient seen and examined.  Reports she continues to feel dizzy and lightheaded when she stands up.  She continues to have back pain and pain all over her joints.  She continues to feel weak and have no energy.  Objective: Vitals:   12/15/21 0030 12/15/21 1543 12/15/21 2240 12/16/21 0544  BP: (!) 155/79 123/68 124/62 120/77  Pulse: 69 72 72 60  Resp: 18 16 16 16   Temp: 98.3 F (36.8 C) 97.7 F (36.5 C) 98.6 F (37 C) 97.7 F (36.5 C)  TempSrc: Oral Oral Oral Oral  SpO2: 98% 99% 97% 100%  Weight:      Height:        Intake/Output Summary (Last 24 hours) at 12/16/2021 1158 Last data filed at 12/16/2021 0946 Gross per 24 hour  Intake 100 ml  Output --  Net 100 ml    02/15/2022   12/13/21  1809 12/14/21 1339  Weight: 90.7 kg 105.3 kg    Examination:  General exam: Appears calm and comfortable, on room air, appears weak and lethargic  respiratory system: Clear to auscultation. Respiratory effort normal. Cardiovascular system: S1 & S2 heard, RRR. No JVD, murmurs, rubs, gallops or clicks. No pedal edema. Gastrointestinal system: Abdomen is nondistended, soft and  nontender. No organomegaly or masses felt. Normal bowel sounds heard. Central nervous system: Alert and oriented. No focal neurological deficits. Extremities: Symmetric 5 x 5 power. Skin: No rashes, lesions or ulcers Psychiatry: Judgement and insight appear normal. Mood & affect appropriate.    Data Reviewed: I have personally reviewed following labs and imaging studies  CBC: Recent Labs  Lab 12/13/21 1811 12/15/21 0204 12/16/21 0134  WBC 9.8 7.8 9.5  NEUTROABS 7.2  --   --   HGB 11.3* 10.1* 10.1*  HCT 35.5* 31.0* 30.6*  MCV 90.8 89.3 88.4  PLT 254 201 676    Basic Metabolic Panel: Recent Labs  Lab 12/13/21 1811 12/15/21 0204 12/16/21 0134  NA 140 141 139  K 4.4 4.1 3.6  CL 104 105 104  CO2 24 28 27   GLUCOSE 106* 103* 97  BUN 18 16 19   CREATININE 0.59 0.83 0.79  CALCIUM 9.1 8.8* 8.9  MG  --   --  1.9    GFR: Estimated Creatinine Clearance: 94.2 mL/min (by C-G formula based on SCr of 0.79 mg/dL). Liver Function Tests: Recent Labs  Lab 12/13/21 1811  AST 18  ALT 18  ALKPHOS 70  BILITOT 0.2*  PROT 7.4  ALBUMIN 3.8    Recent Labs  Lab 12/13/21 1811  LIPASE 29    No results for input(s): "AMMONIA" in the last 168 hours. Coagulation Profile: No results for input(s): "INR", "PROTIME" in the last 168 hours. Cardiac Enzymes: No results for input(s): "CKTOTAL", "CKMB", "CKMBINDEX", "TROPONINI" in the last 168 hours. BNP (last 3 results) No results for input(s): "PROBNP" in the last 8760 hours. HbA1C: No results for input(s): "HGBA1C" in the last 72 hours. CBG: No results for input(s): "GLUCAP" in the last 168 hours. Lipid Profile: No results for input(s): "CHOL", "HDL", "LDLCALC", "TRIG", "CHOLHDL", "LDLDIRECT" in the last 72 hours. Thyroid Function Tests: No results for input(s): "TSH", "T4TOTAL", "FREET4", "T3FREE", "THYROIDAB" in the last 72 hours. Anemia Panel: No results for input(s): "VITAMINB12", "FOLATE", "FERRITIN", "TIBC", "IRON",  "RETICCTPCT" in the last 72 hours. Sepsis Labs: Recent Labs  Lab 12/13/21 1826 12/14/21 0015 12/14/21 0225  PROCALCITON  --   --  <0.10  LATICACIDVEN 1.6 1.6  --      Recent Results (from the past 240 hour(s))  SARS Coronavirus 2 by RT PCR (hospital order, performed in [facility] hospital lab) *cepheid single result test* Anterior Nasal Swab     Status: None   Collection Time: 12/13/21 10:09 PM   Specimen: Anterior Nasal Swab  Result Value Ref Range Status   SARS Coronavirus 2 by RT PCR NEGATIVE NEGATIVE Final    Comment: (NOTE) SARS-CoV-2 target nucleic acids are NOT DETECTED.  The SARS-CoV-2 RNA is generally detectable in upper and lower respiratory specimens during the acute phase of infection. The lowest concentration of SARS-CoV-2 viral copies this assay can detect is 250 copies / mL. A negative result does not preclude SARS-CoV-2 infection and should not be used as the sole basis for treatment or other patient management decisions.  A negative result may occur with improper specimen collection / handling, submission of specimen other than nasopharyngeal  swab, presence of viral mutation(s) within the areas targeted by this assay, and inadequate number of viral copies (<250 copies / mL). A negative result must be combined with clinical observations, patient history, and epidemiological information.  Fact Sheet for Patients:   RoadLapTop.co.za  Fact Sheet for Healthcare Providers: http://kim-miller.com/  This test is not yet approved or  cleared by the [place] FDA and has been authorized for detection and/or diagnosis of SARS-CoV-2 by FDA under an Emergency Use Authorization (EUA).  This EUA will remain in effect (meaning this test can be used) for the duration of the COVID-19 declaration under Section 564(b)(1) of the Act, 21 U.S.C. section 360bbb-3(b)(1), unless the authorization is terminated or revoked  sooner.  Performed at [facility], [address], [place], [place] [address]   Culture, blood (Routine X 2) w Reflex to ID Panel     Status: None (Preliminary result)   Collection Time: 12/14/21 12:00 AM   Specimen: BLOOD  Result Value Ref Range Status   Specimen Description BLOOD LEFT ANTECUBITAL  Final   Special Requests   Final    BOTTLES DRAWN AEROBIC AND ANAEROBIC Blood Culture results may not be optimal due to an inadequate volume of blood received in culture bottles   Culture   Final    NO GROWTH 1 DAY Performed at [facility], [address] [place], [place] [address]    Report Status PENDING  Incomplete  Culture, blood (Routine X 2) w Reflex to ID Panel     Status: None (Preliminary result)   Collection Time: 12/14/21 12:15 AM   Specimen: BLOOD LEFT HAND  Result Value Ref Range Status   Specimen Description BLOOD LEFT HAND  Final   Special Requests   Final    BOTTLES DRAWN AEROBIC AND ANAEROBIC Blood Culture results may not be optimal due to an inadequate volume of blood received in culture bottles   Culture   Final    NO GROWTH 1 DAY Performed at [facility], [address], [place], [place] [address]    Report Status PENDING  Incomplete      Radiology Studies: ECHOCARDIOGRAM COMPLETE  Result Date: 12/14/2021    ECHOCARDIOGRAM REPORT   Patient Name:   Kristin Aguirre Date of Exam: 12/14/2021 Medical Rec #:  [id number]      Height:       69.0 in Accession #:    [id number]     Weight:       200.0 lb Date of Birth:  09/15/1960      BSA:          2.066 m Patient Age:    61 years       BP:           133/73 mmHg Patient Gender: F              HR:           72 bpm. Exam Location:  Inpatient Procedure: 2D Echo, Color Doppler and Cardiac Doppler Indications:    Syncope  History:        Patient has no prior history of Echocardiogram examinations.                 Risk Factors:Dyslipidemia.  Sonographer:    Gaynell Face Referring Phys: [id number] VASUNDHRA RATHORE  IMPRESSIONS  1. Left ventricular ejection fraction, by estimation, is 60 to 65%. The left ventricle has normal function. The left ventricle has no regional  wall motion abnormalities. Left ventricular diastolic parameters were normal.  2. Right ventricular systolic function is normal. The right ventricular size is normal. Tricuspid regurgitation signal is inadequate for assessing PA pressure.  3. The mitral valve is normal in structure. No evidence of mitral valve regurgitation. No evidence of mitral stenosis.  4. The aortic valve is tricuspid. Aortic valve regurgitation is not visualized. No aortic stenosis is present.  5. The inferior vena cava is normal in size with greater than 50% respiratory variability, suggesting right atrial pressure of 3 mmHg. Comparison(s): No prior Echocardiogram. FINDINGS  Left Ventricle: Left ventricular ejection fraction, by estimation, is 60 to 65%. The left ventricle has normal function. The left ventricle has no regional wall motion abnormalities. The left ventricular internal cavity size was normal in size. There is  no left ventricular hypertrophy. Left ventricular diastolic parameters were normal. Right Ventricle: The right ventricular size is normal. Right ventricular systolic function is normal. Tricuspid regurgitation signal is inadequate for assessing PA pressure. The tricuspid regurgitant velocity is 2.28 m/s, and with an assumed right atrial  pressure of 3 mmHg, the estimated right ventricular systolic pressure is 23.8 mmHg. Left Atrium: Left atrial size was normal in size. Right Atrium: Right atrial size was normal in size. Pericardium: There is no evidence of pericardial effusion. Mitral Valve: The mitral valve is normal in structure. No evidence of mitral valve regurgitation. No evidence of mitral valve stenosis. Tricuspid Valve: The tricuspid valve is normal in structure. Tricuspid valve regurgitation is trivial. No evidence of tricuspid stenosis. Aortic Valve: The  aortic valve is tricuspid. Aortic valve regurgitation is not visualized. No aortic stenosis is present. Aortic valve mean gradient measures 6.0 mmHg. Aortic valve peak gradient measures 10.9 mmHg. Aortic valve area, by VTI measures 2.84  cm. Pulmonic Valve: The pulmonic valve was normal in structure. Pulmonic valve regurgitation is not visualized. No evidence of pulmonic stenosis. Aorta: The aortic root is normal in size and structure. Venous: The inferior vena cava is normal in size with greater than 50% respiratory variability, suggesting right atrial pressure of 3 mmHg. IAS/Shunts: No atrial level shunt detected by color flow Doppler.  LEFT VENTRICLE PLAX 2D LVIDd:         4.40 cm   Diastology LVIDs:         2.90 cm   LV e' medial:    10.60 cm/s LV PW:         1.00 cm   LV E/e' medial:  9.5 LV IVS:        1.20 cm   LV e' lateral:   10.70 cm/s LVOT diam:     2.00 cm   LV E/e' lateral: 9.4 LV SV:         93 LV SV Index:   45 LVOT Area:     3.14 cm  RIGHT VENTRICLE RV S prime:     15.50 cm/s TAPSE (M-mode): 1.7 cm LEFT ATRIUM             Index        RIGHT ATRIUM          Index LA diam:        3.20 cm 1.55 cm/m   RA Area:     9.46 cm LA Vol (A2C):   37.5 ml 18.15 ml/m  RA Volume:   16.00 ml 7.75 ml/m LA Vol (A4C):   50.2 ml 24.30 ml/m LA Biplane Vol: 44.3 ml 21.44 ml/m  AORTIC VALVE AV Area (Vmax):  2.38 cm AV Area (Vmean):   2.37 cm AV Area (VTI):     2.84 cm AV Vmax:           165.00 cm/s AV Vmean:          113.000 cm/s AV VTI:            0.327 m AV Peak Grad:      10.9 mmHg AV Mean Grad:      6.0 mmHg LVOT Vmax:         125.00 cm/s LVOT Vmean:        85.200 cm/s LVOT VTI:          0.296 m LVOT/AV VTI ratio: 0.91  AORTA Ao Root diam: 3.30 cm Ao Asc diam:  3.20 cm MITRAL VALVE                TRICUSPID VALVE MV Area (PHT): 3.48 cm     TR Peak grad:   20.8 mmHg MV Decel Time: 218 msec     TR Vmax:        228.00 cm/s MV E velocity: 101.00 cm/s MV A velocity: 116.00 cm/s  SHUNTS MV E/A ratio:  0.87          Systemic VTI:  0.30 m                             Systemic Diam: 2.00 cm Olga Millers MD Electronically signed by Olga Millers MD Signature Date/Time: 12/14/2021/12:51:02 PM    Final     Scheduled Meds:  aspirin EC  81 mg Oral Daily   citalopram  20 mg Oral QHS   doxycycline  100 mg Oral Q12H   enoxaparin (LOVENOX) injection  40 mg Subcutaneous QHS   feeding supplement  237 mL Oral BID BM   influenza vac split quadrivalent PF  0.5 mL Intramuscular Tomorrow-1000   nicotine  14 mg Transdermal Daily   pneumococcal 20-valent conjugate vaccine  0.5 mL Intramuscular Tomorrow-1000   rosuvastatin  10 mg Oral Daily   senna-docusate  1 tablet Oral QHS   Continuous Infusions:  cefTRIAXone (ROCEPHIN)  IV Stopped (12/15/21 2310)     LOS: 2 days   Time spent: 35 minutes   Naomia Lenderman Estill Cotta, MD [facility]  If 7PM-7AM, please contact night-coverage [email or web address] 12/16/2021, 11:58 AM

## 2021-12-16 NOTE — Progress Notes (Signed)
Orthostatic BPs BPs  HR  Supine 142/70 87  Sitting 134/91 86  Standing    Standing after 3 min 135/95 110     Kristin Aguirre

## 2021-12-16 NOTE — Progress Notes (Signed)
   12/16/21 1429  Mobility  Activity Ambulated with assistance in hallway  Level of Assistance Standby assist, set-up cues, supervision of patient - no hands on  Assistive Device Other (Comment) (IV Pole)  Distance Ambulated (ft) 350 ft  Activity Response Tolerated well  $Mobility charge 1 Mobility   Mobility Specialist Progress Note  Received pt in bed having no complaints and agreeable to mobility. Pt was asymptomatic throughout ambulation and returned to room w/o fault. Left in bed w/ call bell in reach and all needs met.  Lucious Groves Mobility Specialist

## 2021-12-17 ENCOUNTER — Other Ambulatory Visit: Payer: Self-pay | Admitting: Cardiology

## 2021-12-17 ENCOUNTER — Ambulatory Visit: Payer: 59

## 2021-12-17 ENCOUNTER — Other Ambulatory Visit: Payer: 59

## 2021-12-17 ENCOUNTER — Encounter: Payer: Self-pay | Admitting: Family Medicine

## 2021-12-17 DIAGNOSIS — R55 Syncope and collapse: Secondary | ICD-10-CM | POA: Diagnosis not present

## 2021-12-17 LAB — BASIC METABOLIC PANEL
Anion gap: 11 (ref 5–15)
BUN: 15 mg/dL (ref 8–23)
CO2: 27 mmol/L (ref 22–32)
Calcium: 9.1 mg/dL (ref 8.9–10.3)
Chloride: 102 mmol/L (ref 98–111)
Creatinine, Ser: 0.66 mg/dL (ref 0.44–1.00)
GFR, Estimated: >60 mL/min (ref >60)
Glucose, Bld: 91 mg/dL (ref 70–99)
Potassium: 3.5 mmol/L (ref 3.5–5.1)
Sodium: 140 mmol/L (ref 135–145)

## 2021-12-17 LAB — MAGNESIUM: Magnesium: 2 mg/dL (ref 1.7–2.4)

## 2021-12-17 MED ORDER — ONDANSETRON HCL 4 MG PO TABS: 4.0000 mg | ORAL_TABLET | Freq: Every day | ORAL | 1 refills | Status: DC | PRN

## 2021-12-17 MED ORDER — SUMATRIPTAN SUCCINATE 50 MG PO TABS: 50.0000 mg | ORAL_TABLET | ORAL | 0 refills | Status: DC | PRN

## 2021-12-17 MED ORDER — NICOTINE 14 MG/24HR TD PT24: 14.0000 mg | MEDICATED_PATCH | Freq: Every day | TRANSDERMAL | 0 refills | Status: DC

## 2021-12-17 NOTE — Discharge Summary (Signed)
Physician Discharge Summary   Patient: Kristin Aguirre MRN: [id number] DOB: [birth year]  Admit date:     12/13/2021  Discharge date: 12/17/21  Discharge Physician: Alberteen Sam   PCP: [facility]     Recommendations at discharge:  Follow up with Dr. Rosemary Holms Cardiology for monitoring Follow up for PSG Follow up with PCP in 1 week     Discharge Diagnoses: Principal Problem:   Syncope Active Problems:   Anxiety, generalized   GERD (gastroesophageal reflux disease)   Hyperlipidemia   Essential hypertension   CAP (community acquired pneumonia), possible   Vomiting      Hospital Course: Mrs. Novakowski is a 61 y.o. F with HTN, HLD, smoking, and obesity who presented with possible syncope, questionable brief cardiac arrest, troponin elevation.    Found unresponsive by her coworker sitting in her chair.  Coworkers could not find a pulse, performed brief CPR and called EMS. When EMS arrived, patient was reportedly cyanotic, but had a pulse. She was brought to [facility] ER, and was alert by then.     "Patient states her coworker brought her hamburger, fries, and chicken nuggets for lunch.  After eating, while she was still sitting in a chair, she felt dizzy and felt the room spinning around her.  She then vomited and does not remember what happened afterwards.  She remembers waking up with EMS around her.  States she has not been feeling well for the past few days, having generalized weakness/fatigue, cough, and shortness of breath."  Workup showed mildly elevated d-Dimer, CTA with no PE, mild coronary calcification, normal echocardiogram, HS trip 15-->38-->33.    Syncope There was a prodrome suggestive of vagal episode.  Note, this event occurred in the setting of as yet untreated sleep apnea, as well as Xanax and oxycodone use, all 3 of which increase the likelihood of a syncopal event.  She was evaluated by cardiology, had an echocardiogram that was  unremarkable, was recommended to have outpatient cardiac monitoring for 1 month.  This was arranged.  Dr. Rosemary Holms and will be set up today.     Possible CAP CXR possible pneumonia.  Treated with Rocephin and doxycycline in the hospital.  Completely asymptomatic. Patient mentating at baseline, taking orals.  Temp < 100 F, heart rate < 100bpm, RR < 24, SpO2 at baseline.  I feel she has finihsed an adequate course of antibiotics             The [facility] Controlled Substances Registry was reviewed for this patient prior to discharge.   Consultants: Cardiology, Dr. Rosemary Holms Procedures performed: Echo  Disposition: Home   DISCHARGE MEDICATION: Allergies as of 12/17/2021       Reactions   Furosemide Rash        Medication List     STOP taking these medications    hydrochlorothiazide 25 MG tablet Commonly known as: HYDRODIURIL   pantoprazole 40 MG tablet Commonly known as: PROTONIX   propranolol 20 MG tablet Commonly known as: INDERAL       TAKE these medications    ALPRAZolam 0.5 MG tablet Commonly known as: XANAX Take 1 tablet (0.5 mg total) by mouth 2 (two) times daily as needed. Due for 6 month controlled substance office visit. Please call and schedule an appointment.   aspirin EC 81 MG tablet Take 81 mg by mouth daily. Swallow whole.   citalopram 40 MG tablet Commonly known as: CELEXA Take 1 tablet (40 mg total) by mouth  at bedtime.   fluticasone 50 MCG/ACT nasal spray Commonly known as: FLONASE Place 2 sprays into both nostrils daily. What changed:  when to take this reasons to take this   lisinopril 10 MG tablet Commonly known as: ZESTRIL Take 1 tablet (10 mg total) by mouth daily.   nicotine 14 mg/24hr patch Commonly known as: NICODERM CQ - dosed in mg/24 hours Place 1 patch (14 mg total) onto the skin daily. Start taking on: December 18, 2021   ondansetron 4 MG tablet Commonly known as: Zofran Take 1 tablet (4 mg total) by  mouth daily as needed for nausea or vomiting.   oxyCODONE-acetaminophen 7.5-325 MG tablet Commonly known as: Percocet Take 1 tablet by mouth every 6 (six) hours as needed for severe pain.   rosuvastatin 10 MG tablet Commonly known as: Crestor Take 1 tablet (10 mg total) by mouth daily.   SUMAtriptan 50 MG tablet Commonly known as: IMITREX Take 1 tablet (50 mg total) by mouth every 2 (two) hours as needed for migraine (2 doses max per day). May repeat in 2 hours if headache persists or recurs.        Follow-up Information     Patwardhan, Reynold Bowen, MD Follow up.   Specialties: Cardiology, Radiology Contact information: [place] [address] [phone]         Primary care physician Follow up.                    Discharge Exam: Filed Weights   12/13/21 1809 12/14/21 1339  Weight: 90.7 kg 105.3 kg    General: Pt is alert, awake, not in acute distress Cardiovascular: RRR, nl S1-S2, no murmurs appreciated.   No LE edema.   Respiratory: Normal respiratory rate and rhythm.  CTAB without rales or wheezes. Abdominal: Abdomen soft and non-tender.  No distension or HSM.   Neuro/Psych: Strength symmetric in upper and lower extremities.  Judgment and insight appear normal.   Condition at discharge: good  The results of significant diagnostics from this hospitalization (including imaging, microbiology, ancillary and laboratory) are listed below for reference.   Imaging Studies: ECHOCARDIOGRAM COMPLETE  Result Date: 12/14/2021    ECHOCARDIOGRAM REPORT   Patient Name:   Kristin Aguirre Date of Exam: 12/14/2021 Medical Rec #:  [id number]      Height:       69.0 in Accession #:    [id number]     Weight:       200.0 lb Date of Birth:  1961-02-08      BSA:          2.066 m Patient Age:    34 years       BP:           133/73 mmHg Patient Gender: F              HR:           72 bpm. Exam Location:  Inpatient Procedure: 2D Echo, Color Doppler and  Cardiac Doppler Indications:    Syncope  History:        Patient has no prior history of Echocardiogram examinations.                 Risk Factors:Dyslipidemia.  Sonographer:    Memory Argue Referring Phys: [id number] [place]  1. Left ventricular ejection fraction, by estimation, is 60 to 65%. The left ventricle has normal function. The left ventricle has no regional wall  motion abnormalities. Left ventricular diastolic parameters were normal.  2. Right ventricular systolic function is normal. The right ventricular size is normal. Tricuspid regurgitation signal is inadequate for assessing PA pressure.  3. The mitral valve is normal in structure. No evidence of mitral valve regurgitation. No evidence of mitral stenosis.  4. The aortic valve is tricuspid. Aortic valve regurgitation is not visualized. No aortic stenosis is present.  5. The inferior vena cava is normal in size with greater than 50% respiratory variability, suggesting right atrial pressure of 3 mmHg. Comparison(s): No prior Echocardiogram. FINDINGS  Left Ventricle: Left ventricular ejection fraction, by estimation, is 60 to 65%. The left ventricle has normal function. The left ventricle has no regional wall motion abnormalities. The left ventricular internal cavity size was normal in size. There is  no left ventricular hypertrophy. Left ventricular diastolic parameters were normal. Right Ventricle: The right ventricular size is normal. Right ventricular systolic function is normal. Tricuspid regurgitation signal is inadequate for assessing PA pressure. The tricuspid regurgitant velocity is 2.28 m/s, and with an assumed right atrial  pressure of 3 mmHg, the estimated right ventricular systolic pressure is 23.8 mmHg. Left Atrium: Left atrial size was normal in size. Right Atrium: Right atrial size was normal in size. Pericardium: There is no evidence of pericardial effusion. Mitral Valve: The mitral valve is normal in structure. No  evidence of mitral valve regurgitation. No evidence of mitral valve stenosis. Tricuspid Valve: The tricuspid valve is normal in structure. Tricuspid valve regurgitation is trivial. No evidence of tricuspid stenosis. Aortic Valve: The aortic valve is tricuspid. Aortic valve regurgitation is not visualized. No aortic stenosis is present. Aortic valve mean gradient measures 6.0 mmHg. Aortic valve peak gradient measures 10.9 mmHg. Aortic valve area, by VTI measures 2.84  cm. Pulmonic Valve: The pulmonic valve was normal in structure. Pulmonic valve regurgitation is not visualized. No evidence of pulmonic stenosis. Aorta: The aortic root is normal in size and structure. Venous: The inferior vena cava is normal in size with greater than 50% respiratory variability, suggesting right atrial pressure of 3 mmHg. IAS/Shunts: No atrial level shunt detected by color flow Doppler.  LEFT VENTRICLE PLAX 2D LVIDd:         4.40 cm   Diastology LVIDs:         2.90 cm   LV e' medial:    10.60 cm/s LV PW:         1.00 cm   LV E/e' medial:  9.5 LV IVS:        1.20 cm   LV e' lateral:   10.70 cm/s LVOT diam:     2.00 cm   LV E/e' lateral: 9.4 LV SV:         93 LV SV Index:   45 LVOT Area:     3.14 cm  RIGHT VENTRICLE RV S prime:     15.50 cm/s TAPSE (M-mode): 1.7 cm LEFT ATRIUM             Index        RIGHT ATRIUM          Index LA diam:        3.20 cm 1.55 cm/m   RA Area:     9.46 cm LA Vol (A2C):   37.5 ml 18.15 ml/m  RA Volume:   16.00 ml 7.75 ml/m LA Vol (A4C):   50.2 ml 24.30 ml/m LA Biplane Vol: 44.3 ml 21.44 ml/m  AORTIC VALVE AV Area (Vmax):  2.38 cm AV Area (Vmean):   2.37 cm AV Area (VTI):     2.84 cm AV Vmax:           165.00 cm/s AV Vmean:          113.000 cm/s AV VTI:            0.327 m AV Peak Grad:      10.9 mmHg AV Mean Grad:      6.0 mmHg LVOT Vmax:         125.00 cm/s LVOT Vmean:        85.200 cm/s LVOT VTI:          0.296 m LVOT/AV VTI ratio: 0.91  AORTA Ao Root diam: 3.30 cm Ao Asc diam:  3.20 cm MITRAL  VALVE                TRICUSPID VALVE MV Area (PHT): 3.48 cm     TR Peak grad:   20.8 mmHg MV Decel Time: 218 msec     TR Vmax:        228.00 cm/s MV E velocity: 101.00 cm/s MV A velocity: 116.00 cm/s  SHUNTS MV E/A ratio:  0.87         Systemic VTI:  0.30 m                             Systemic Diam: 2.00 cm Olga Millers MD Electronically signed by Olga Millers MD Signature Date/Time: 12/14/2021/12:51:02 PM    Final    EEG adult  Result Date: 12/14/2021 Charlsie Quest, MD     12/14/2021  7:58 AM Patient Name: ARIYANNAH PAULING MRN: [id number] Epilepsy Attending: Charlsie Quest Referring Physician/Provider: John Giovanni, MD Date: 12/14/2021 Duration: 21.14 mins Patient history: 61yo F with syncope. EEG to evaluate for seizure. Level of alertness: Awake, asleep AEDs during EEG study: None Technical aspects: This EEG study was done with scalp electrodes positioned according to the 10-20 International system of electrode placement. Electrical activity was reviewed with band pass filter of 1-70Hz , sensitivity of 7 uV/mm, display speed of 94mm/sec with a  notched filter applied as appropriate. EEG data were recorded continuously and digitally stored.  Video monitoring was available and reviewed as appropriate. Description: The posterior dominant rhythm consists of 8 Hz activity of moderate voltage (25-35 uV) seen predominantly in posterior head regions, symmetric and reactive to eye opening and eye closing.Sleep was characterized by vertex waves, maximal frontocentral region. Hyperventilation and photic stimulation were not performed.   IMPRESSION: This study is within normal limits. No seizures or epileptiform discharges were seen throughout the recording. A normal interictal EEG does not exclude nor support the diagnosis of epilepsy. Charlsie Quest   CT Angio Chest PE W and/or Wo Contrast  Result Date: 12/13/2021 CLINICAL DATA:  Pulmonary embolism (PE) suspected, positive D-dimer. Cardiopulmonary  arrest EXAM: CT ANGIOGRAPHY CHEST WITH CONTRAST TECHNIQUE: Multidetector CT imaging of the chest was performed using the standard protocol during bolus administration of intravenous contrast. Multiplanar CT image reconstructions and MIPs were obtained to evaluate the vascular anatomy. RADIATION DOSE REDUCTION: This exam was performed according to the departmental dose-optimization program which includes automated exposure control, adjustment of the mA and/or kV according to patient size and/or use of iterative reconstruction technique. CONTRAST:  80mL OMNIPAQUE IOHEXOL 350 MG/ML SOLN COMPARISON:  04/11/2012 FINDINGS: Cardiovascular: Slightly suboptimal bolus timing and resultant opacification of the pulmonary arterial tree, however, the  examination is still diagnostic for evaluation through the segmental level. No intraluminal filling defect identified to suggest acute pulmonary embolism. Central pulmonary arteries are of normal caliber. Mild coronary artery calcification. Global cardiac size within normal limits. No pericardial effusion. Mild atherosclerotic calcification within the thoracic aorta. No aortic aneurysm. Aberrant origin of the right subclavian artery. Mediastinum/Nodes: Multiple bilateral thyroid nodules are identified measuring up to 23 mm within the right thyroid lobe, stable since prior examination and unlikely of clinical significance given their stability over time. No follow-up imaging is recommended for these lesions. No pathologic thoracic adenopathy. Esophagus unremarkable. Lungs/Pleura: Multifocal ground-glass pulmonary infiltrates are seen scattered throughout the lungs, more focally within the left lower lobe, possibly infectious or inflammatory in nature as can be seen with atypical infection. No pneumothorax or pleural effusion. Central airways are widely patent. Upper Abdomen: No acute abnormality. Musculoskeletal: No chest wall abnormality. No acute or significant osseous findings.  Review of the MIP images confirms the above findings. IMPRESSION: 1. No pulmonary embolism. 2. Multifocal ground-glass pulmonary infiltrates, more focally within the left lower lobe, possibly infectious or inflammatory in nature as can be seen with atypical infection. 3. Mild coronary artery calcification. Aortic Atherosclerosis (ICD10-I70.0). Electronically Signed   By: Helyn Numbers M.D.   On: 12/13/2021 21:08   CT Head Wo Contrast  Result Date: 12/13/2021 CLINICAL DATA:  Head trauma, moderate-severe. Loss of consciousness. Received CPR. EXAM: CT HEAD WITHOUT CONTRAST TECHNIQUE: Contiguous axial images were obtained from the base of the skull through the vertex without intravenous contrast. RADIATION DOSE REDUCTION: This exam was performed according to the departmental dose-optimization program which includes automated exposure control, adjustment of the mA and/or kV according to patient size and/or use of iterative reconstruction technique. COMPARISON:  Head CT report from 07/21/1996 FINDINGS: Brain: There is no evidence of an acute infarct, intracranial hemorrhage, mass, midline shift, or extra-axial fluid collection. The ventricles and sulci are normal. Vascular: No hyperdense vessel. Skull: No fracture or suspicious osseous lesion. Sinuses/Orbits: Visualized paranasal sinuses and mastoid air cells are clear. Unremarkable orbits. Other: None. IMPRESSION: Negative head CT. Electronically Signed   By: Sebastian Ache M.D.   On: 12/13/2021 21:02    Microbiology: Results for orders placed or performed during the hospital encounter of 12/13/21  SARS Coronavirus 2 by RT PCR (hospital order, performed in [facility] hospital lab) *cepheid single result test* Anterior Nasal Swab     Status: None   Collection Time: 12/13/21 10:09 PM   Specimen: Anterior Nasal Swab  Result Value Ref Range Status   SARS Coronavirus 2 by RT PCR NEGATIVE NEGATIVE Final    Comment: (NOTE) SARS-CoV-2 target nucleic acids are NOT  DETECTED.  The SARS-CoV-2 RNA is generally detectable in upper and lower respiratory specimens during the acute phase of infection. The lowest concentration of SARS-CoV-2 viral copies this assay can detect is 250 copies / mL. A negative result does not preclude SARS-CoV-2 infection and should not be used as the sole basis for treatment or other patient management decisions.  A negative result may occur with improper specimen collection / handling, submission of specimen other than nasopharyngeal swab, presence of viral mutation(s) within the areas targeted by this assay, and inadequate number of viral copies (<250 copies / mL). A negative result must be combined with clinical observations, patient history, and epidemiological information.  Fact Sheet for Patients:   RoadLapTop.co.za  Fact Sheet for Healthcare Providers: http://kim-miller.com/  This test is not yet approved or  cleared by  the [employer] and has been authorized for detection and/or diagnosis of SARS-CoV-2 by FDA under an Emergency Use Authorization (EUA).  This EUA will remain in effect (meaning this test can be used) for the duration of the COVID-19 declaration under Section 564(b)(1) of the Act, 21 U.S.C. section 360bbb-3(b)(1), unless the authorization is terminated or revoked sooner.  Performed at [facility], [address], [place], [place] [address]   Culture, blood (Routine X 2) w Reflex to ID Panel     Status: None (Preliminary result)   Collection Time: 12/14/21 12:00 AM   Specimen: BLOOD  Result Value Ref Range Status   Specimen Description BLOOD LEFT ANTECUBITAL  Final   Special Requests   Final    BOTTLES DRAWN AEROBIC AND ANAEROBIC Blood Culture results may not be optimal due to an inadequate volume of blood received in culture bottles   Culture   Final    NO GROWTH 3 DAYS Performed at [facility], [address], [place], [place]  [address]    Report Status PENDING  Incomplete  Culture, blood (Routine X 2) w Reflex to ID Panel     Status: None (Preliminary result)   Collection Time: 12/14/21 12:15 AM   Specimen: BLOOD LEFT HAND  Result Value Ref Range Status   Specimen Description BLOOD LEFT HAND  Final   Special Requests   Final    BOTTLES DRAWN AEROBIC AND ANAEROBIC Blood Culture results may not be optimal due to an inadequate volume of blood received in culture bottles   Culture   Final    NO GROWTH 3 DAYS Performed at [facility], [address], [place], [place] [address]    Report Status PENDING  Incomplete    Labs: CBC: Recent Labs  Lab 12/13/21 1811 12/15/21 0204 12/16/21 0134  WBC 9.8 7.8 9.5  NEUTROABS 7.2  --   --   HGB 11.3* 10.1* 10.1*  HCT 35.5* 31.0* 30.6*  MCV 90.8 89.3 88.4  PLT 254 201 228   Basic Metabolic Panel: Recent Labs  Lab 12/13/21 1811 12/15/21 0204 12/16/21 0134 12/17/21 0326  NA 140 141 139 140  K 4.4 4.1 3.6 3.5  CL 104 105 104 102  CO2 24 28 27 27   GLUCOSE 106* 103* 97 91  BUN 18 16 19 15   CREATININE 0.59 0.83 0.79 0.66  CALCIUM 9.1 8.8* 8.9 9.1  MG  --   --  1.9 2.0   Liver Function Tests: Recent Labs  Lab 12/13/21 1811  AST 18  ALT 18  ALKPHOS 70  BILITOT 0.2*  PROT 7.4  ALBUMIN 3.8   CBG: No results for input(s): "GLUCAP" in the last 168 hours.  Discharge time spent: approximately 35 minutes spent on discharge counseling, evaluation of patient on day of discharge, and coordination of discharge planning with nursing, social work, pharmacy and case management  Signed: Alberteen Sam, MD [facility] 12/17/2021

## 2021-12-17 NOTE — Plan of Care (Signed)
  Problem: Education: Goal: Knowledge of General Education information will improve Description: Including pain rating scale, medication(s)/side effects and non-pharmacologic comfort measures 12/17/2021 1026 by Carlynn Spry, RN Outcome: Adequate for Discharge 12/17/2021 1026 by Carlynn Spry, RN Outcome: Adequate for Discharge   Problem: Health Behavior/Discharge Planning: Goal: Ability to manage health-related needs will improve 12/17/2021 1026 by Carlynn Spry, RN Outcome: Adequate for Discharge 12/17/2021 1026 by Carlynn Spry, RN Outcome: Adequate for Discharge   Problem: Clinical Measurements: Goal: Ability to maintain clinical measurements within normal limits will improve 12/17/2021 1026 by Carlynn Spry, RN Outcome: Adequate for Discharge 12/17/2021 1026 by Carlynn Spry, RN Outcome: Adequate for Discharge Goal: Will remain free from infection 12/17/2021 1026 by Carlynn Spry, RN Outcome: Adequate for Discharge 12/17/2021 1026 by Carlynn Spry, RN Outcome: Adequate for Discharge Goal: Diagnostic test results will improve 12/17/2021 1026 by Carlynn Spry, RN Outcome: Adequate for Discharge 12/17/2021 1026 by Carlynn Spry, RN Outcome: Adequate for Discharge Goal: Respiratory complications will improve 12/17/2021 1026 by Carlynn Spry, RN Outcome: Adequate for Discharge 12/17/2021 1026 by Carlynn Spry, RN Outcome: Adequate for Discharge Goal: Cardiovascular complication will be avoided 12/17/2021 1026 by Carlynn Spry, RN Outcome: Adequate for Discharge 12/17/2021 1026 by Carlynn Spry, RN Outcome: Adequate for Discharge   Problem: Activity: Goal: Risk for activity intolerance will decrease 12/17/2021 1026 by Carlynn Spry, RN Outcome: Adequate for Discharge 12/17/2021 1026 by Carlynn Spry, RN Outcome: Adequate for Discharge   Problem: Nutrition: Goal: Adequate nutrition will be  maintained 12/17/2021 1026 by Carlynn Spry, RN Outcome: Adequate for Discharge 12/17/2021 1026 by Carlynn Spry, RN Outcome: Adequate for Discharge   Problem: Coping: Goal: Level of anxiety will decrease 12/17/2021 1026 by Carlynn Spry, RN Outcome: Adequate for Discharge 12/17/2021 1026 by Carlynn Spry, RN Outcome: Adequate for Discharge   Problem: Elimination: Goal: Will not experience complications related to bowel motility 12/17/2021 1026 by Carlynn Spry, RN Outcome: Adequate for Discharge 12/17/2021 1026 by Carlynn Spry, RN Outcome: Adequate for Discharge Goal: Will not experience complications related to urinary retention 12/17/2021 1026 by Carlynn Spry, RN Outcome: Adequate for Discharge 12/17/2021 1026 by Carlynn Spry, RN Outcome: Adequate for Discharge   Problem: Pain Managment: Goal: General experience of comfort will improve 12/17/2021 1026 by Carlynn Spry, RN Outcome: Adequate for Discharge 12/17/2021 1026 by Carlynn Spry, RN Outcome: Adequate for Discharge   Problem: Safety: Goal: Ability to remain free from injury will improve 12/17/2021 1026 by Carlynn Spry, RN Outcome: Adequate for Discharge 12/17/2021 1026 by Carlynn Spry, RN Outcome: Adequate for Discharge   Problem: Skin Integrity: Goal: Risk for impaired skin integrity will decrease 12/17/2021 1026 by Carlynn Spry, RN Outcome: Adequate for Discharge 12/17/2021 1026 by Carlynn Spry, RN Outcome: Adequate for Discharge

## 2021-12-17 NOTE — Hospital Course (Addendum)
Mrs. Grinage is a 61 y.o. F with HTN, HLD, smoking, and obesity who presented with possible syncope, questionable brief cardiac arrest, troponin elevation.    Found unresponsive by her coworker sitting in her chair.  Coworkers could not find a pulse, performed brief CPR and called EMS. When EMS arrived, patient was reportedly cyanotic, but had a pulse. She was brought to [facility] ER, and was alert by then.     "Patient states her coworker brought her hamburger, fries, and chicken nuggets for lunch.  After eating, while she was still sitting in a chair, she felt dizzy and felt the room spinning around her.  She then vomited and does not remember what happened afterwards.  She remembers waking up with EMS around her.  States she has not been feeling well for the past few days, having generalized weakness/fatigue, cough, and shortness of breath."  Workup showed mildly elevated d-Dimer, CTA with no PE, mild coronary calcification, normal echocardiogram, HS trip 15-->38-->33.

## 2021-12-18 ENCOUNTER — Other Ambulatory Visit: Payer: Self-pay | Admitting: Family Medicine

## 2021-12-18 MED ORDER — OXYCODONE-ACETAMINOPHEN 7.5-325 MG PO TABS: 1.0000 | ORAL_TABLET | Freq: Four times a day (QID) | ORAL | 0 refills | Status: DC | PRN

## 2021-12-19 LAB — CULTURE, BLOOD (ROUTINE X 2)
Culture: NO GROWTH
Culture: NO GROWTH

## 2021-12-24 ENCOUNTER — Ambulatory Visit: Payer: Self-pay | Admitting: Family Medicine

## 2021-12-24 ENCOUNTER — Other Ambulatory Visit: Payer: Self-pay | Admitting: Family Medicine

## 2021-12-25 ENCOUNTER — Encounter: Payer: Self-pay | Admitting: Family Medicine

## 2021-12-25 DIAGNOSIS — G43019 Migraine without aura, intractable, without status migrainosus: Secondary | ICD-10-CM

## 2021-12-26 ENCOUNTER — Other Ambulatory Visit: Payer: Self-pay | Admitting: Family Medicine

## 2021-12-26 MED ORDER — OXYCODONE-ACETAMINOPHEN 7.5-325 MG PO TABS: 1.0000 | ORAL_TABLET | Freq: Four times a day (QID) | ORAL | 0 refills | Status: DC | PRN

## 2021-12-26 NOTE — Telephone Encounter (Signed)
Requested medication (s) are due for refill today: duplicate   Requested medication (s) are on the active medication list: yes   Last refill:  12/26/21- 12/26/22 #90 0 refills  Future visit scheduled: yes   Notes to clinic:  not delegated per protocol. Receipt confirmed by pharmacy 12/26/21 at 1:10 pm.     Requested Prescriptions  Pending Prescriptions Disp Refills   oxyCODONE-acetaminophen (PERCOCET) 7.5-325 MG tablet 12 tablet 0    Sig: Take 1 tablet by mouth every 6 (six) hours as needed for severe pain.     Not Delegated - Analgesics:  Opioid Agonist Combinations Failed - 12/26/2021 11:25 AM      Failed - This refill cannot be delegated      Passed - Urine Drug Screen completed in last 360 days      Passed - Valid encounter within last 3 months    Recent Outpatient Visits           3 weeks ago [place], DO   6 months ago Chronic, continuous use of opioids   [facility] Gwyneth Sprout, FNP   1 year ago Colon cancer screening   [facility] Tally Joe T, FNP   1 year ago Intractable migraine without aura and without status migrainosus   [facility] Gwyneth Sprout, FNP   2 years ago Annual physical exam   [place], Clearnce Sorrel, [place]       Future Appointments             In 2 days Bacigalupo, Dionne Bucy, MD [facility], [place]   In 6 days [place], [place], [facility]   In 3 weeks Patwardhan, Reynold Bowen, MD [facility]

## 2021-12-26 NOTE — Telephone Encounter (Signed)
Copied from [place] [id number]. Topic: General - Other >> Dec 26, 2021 10:52 AM Everette C wrote: Reason for CRM: Medication Refill - Medication: oxyCODONE-acetaminophen (PERCOCET) 7.5-325 MG tablet [[id number]]   Has the patient contacted their pharmacy? No. (Agent: If no, request that the patient contact the pharmacy for the refill. If patient does not wish to contact the pharmacy document the reason why and proceed with request.) (Agent: If yes, when and what did the pharmacy advise?)  Preferred Pharmacy (with phone number or street name): [facility] [place], [place] [address] Phone: [phone] Fax: [phone] Hours: Not open 24 hours   Has the patient been seen for an appointment in the last year OR does the patient have an upcoming appointment? Yes.    Agent: Please be advised that RX refills may take up to 3 business days. We ask that you follow-up with your pharmacy.

## 2021-12-27 NOTE — Telephone Encounter (Signed)
Requested medications are due for refill today.  no  Requested medications are on the active medications list.  yes  Last refill. 12/26/2021  Future visit scheduled.   yes  Notes to clinic.  Refill not delegated. Med refilled yesterday - duplicate.    Requested Prescriptions  Pending Prescriptions Disp Refills   oxyCODONE-acetaminophen (PERCOCET) 7.5-325 MG tablet [Pharmacy Med Name: ZOXWRUEAV-WUJW 7.5-325 MG TAB] 12 tablet     Sig: TAKE 1 TABLET BY MOUTH EVERY 6 HOURS AS NEEDED FOR SEVERE PAIN     Not Delegated - Analgesics:  Opioid Agonist Combinations Failed - 12/26/2021 11:33 AM      Failed - This refill cannot be delegated      Passed - Urine Drug Screen completed in last 360 days      Passed - Valid encounter within last 3 months    Recent Outpatient Visits           3 weeks ago [place], [place], DO   6 months ago Chronic, continuous use of opioids   [facility] Tally Joe T, FNP   1 year ago Colon cancer screening   [facility] Tally Joe T, FNP   1 year ago Intractable migraine without aura and without status migrainosus   [facility] Gwyneth Sprout, FNP   2 years ago Annual physical exam   [place], Clearnce Sorrel, [place]       Future Appointments             Tomorrow Bacigalupo, Dionne Bucy, MD [facility], [place]   In 5 days [place], [place], [facility]   In 2 weeks Patwardhan, Reynold Bowen, MD [facility]

## 2021-12-28 ENCOUNTER — Ambulatory Visit (INDEPENDENT_AMBULATORY_CARE_PROVIDER_SITE_OTHER): Payer: 59 | Admitting: Family Medicine

## 2021-12-28 ENCOUNTER — Encounter: Payer: Self-pay | Admitting: Family Medicine

## 2021-12-28 VITALS — BP 120/85 | HR 71 | Resp 16 | Wt 226.0 lb

## 2021-12-28 DIAGNOSIS — I10 Essential (primary) hypertension: Secondary | ICD-10-CM

## 2021-12-28 DIAGNOSIS — J189 Pneumonia, unspecified organism: Secondary | ICD-10-CM

## 2021-12-28 DIAGNOSIS — F419 Anxiety disorder, unspecified: Secondary | ICD-10-CM | POA: Diagnosis not present

## 2021-12-28 DIAGNOSIS — R55 Syncope and collapse: Secondary | ICD-10-CM | POA: Diagnosis not present

## 2021-12-28 DIAGNOSIS — R0683 Snoring: Secondary | ICD-10-CM

## 2021-12-28 MED ORDER — CITALOPRAM HYDROBROMIDE 40 MG PO TABS: 40.0000 mg | ORAL_TABLET | Freq: Every day | ORAL | 3 refills | Status: DC

## 2021-12-28 MED ORDER — VITAMIN D (ERGOCALCIFEROL) 1.25 MG (50000 UNIT) PO CAPS: 50000.0000 [IU] | ORAL_CAPSULE | ORAL | 0 refills | Status: DC

## 2021-12-28 MED ORDER — ALPRAZOLAM 0.5 MG PO TABS: 0.5000 mg | ORAL_TABLET | Freq: Two times a day (BID) | ORAL | 0 refills | Status: DC | PRN

## 2021-12-28 NOTE — Assessment & Plan Note (Signed)
Continue celexa Slightly heightened in setting of recent hospitalization Using xanax sparingly Refill today Due for 66m controlled substance f/u - scheduled

## 2021-12-28 NOTE — Assessment & Plan Note (Signed)
Resolved Finished treatment while hospitalized resp stasus at baseline

## 2021-12-28 NOTE — Assessment & Plan Note (Signed)
Home sleep study is at her home and she will complete this weekend Treatment pending results Concern for possible OSA

## 2021-12-28 NOTE — Progress Notes (Signed)
I,April Miller,acting as a scribe for Lavon Paganini, MD.,have documented all relevant documentation on the behalf of Lavon Paganini, MD,as directed by  Lavon Paganini, MD while in the presence of Lavon Paganini, MD.   Established patient visit   Patient: Kristin Aguirre   DOB: 1960-09-22   61 y.o. Female  MRN: [id number] Visit Date: 12/28/2021  Today's healthcare provider: Lavon Paganini, MD   Chief Complaint  Patient presents with   Hospitalization Follow-up   Subjective    HPI  Patient is here to discuss hospitalization 9/28-10/2 for vasovagal syncope and CAP.  Has heart monitor in place. Has f/u appt scheduled with cardiology.  Sleep study machine came to her home and she is going to do it this weekend.  Upcoming stress test scheduled.  No further episodes, breathing at baseline, feeling weak.  Going back to work on [employer] 12/31/21.  Disbaility and Accident insurance forms completed today.  She does not have FMLA forms with her but will reach out if she needs this in the future.  Has been out of Xanax since discharge - was given it in the hospital.  Only takes prn.  Has not smoked since admission.  She is still using nicotine patch.     Medications: Outpatient Medications Prior to Visit  Medication Sig   aspirin EC 81 MG tablet Take 81 mg by mouth daily. Swallow whole.   fluticasone (FLONASE) 50 MCG/ACT nasal spray Place 2 sprays into both nostrils daily. (Patient taking differently: Place 2 sprays into both nostrils as needed.)   lisinopril (ZESTRIL) 10 MG tablet Take 1 tablet (10 mg total) by mouth daily.   nicotine (NICODERM CQ - DOSED IN MG/24 HOURS) 14 mg/24hr patch Place 1 patch (14 mg total) onto the skin daily.   ondansetron (ZOFRAN) 4 MG tablet Take 1 tablet (4 mg total) by mouth daily as needed for nausea or vomiting.   oxyCODONE-acetaminophen (PERCOCET) 7.5-325 MG tablet Take 1 tablet by mouth every 6 (six) hours as needed for severe pain.    rosuvastatin (CRESTOR) 10 MG tablet Take 1 tablet (10 mg total) by mouth daily.   SUMAtriptan (IMITREX) 50 MG tablet Take 1 tablet (50 mg total) by mouth every 2 (two) hours as needed for migraine (2 doses max per day). May repeat in 2 hours if headache persists or recurs.   [DISCONTINUED] ALPRAZolam (XANAX) 0.5 MG tablet Take 1 tablet (0.5 mg total) by mouth 2 (two) times daily as needed. Due for 6 month controlled substance office visit. Please call and schedule an appointment.   [DISCONTINUED] citalopram (CELEXA) 40 MG tablet Take 1 tablet (40 mg total) by mouth at bedtime.   No facility-administered medications prior to visit.    Review of Systems  Constitutional:  Negative for appetite change, chills, fatigue and fever.  Respiratory:  Negative for chest tightness and shortness of breath.   Cardiovascular:  Negative for chest pain and palpitations.  Gastrointestinal:  Negative for abdominal pain, nausea and vomiting.  Neurological:  Negative for dizziness and weakness.       Objective    BP 120/85 (BP Location: Left Arm, Patient Position: Sitting, Cuff Size: Large)   Pulse 71   Resp 16   Wt 226 lb (102.5 kg)   LMP 03/18/2001 (Approximate)   SpO2 96%   BMI 33.37 kg/m    Physical Exam Vitals reviewed.  Constitutional:      General: She is not in acute distress.    Appearance: Normal appearance.  She is well-developed. She is not diaphoretic.  HENT:     Head: Normocephalic and atraumatic.  Eyes:     General: No scleral icterus.    Conjunctiva/sclera: Conjunctivae normal.  Neck:     Thyroid: No thyromegaly.  Cardiovascular:     Rate and Rhythm: Normal rate and regular rhythm.     Pulses: Normal pulses.     Heart sounds: Normal heart sounds. No murmur heard. Pulmonary:     Effort: Pulmonary effort is normal. No respiratory distress.     Breath sounds: Normal breath sounds. No wheezing, rhonchi or rales.  Musculoskeletal:     Cervical back: Neck supple.     Right lower  leg: No edema.     Left lower leg: No edema.  Lymphadenopathy:     Cervical: No cervical adenopathy.  Skin:    General: Skin is warm and dry.     Findings: No rash.  Neurological:     Mental Status: She is alert and oriented to person, place, and time. Mental status is at baseline.  Psychiatric:        Mood and Affect: Mood normal.        Behavior: Behavior normal.       No results found for any visits on 12/28/21.  Assessment & Plan     Problem List Items Addressed This Visit       Cardiovascular and Mediastinum   Essential hypertension    Well controlled Continue current medications Reviewed recent metabolic panel        Respiratory   CAP (community acquired pneumonia) - Primary    Resolved Finished treatment while hospitalized resp stasus at baseline        Other   Anxiety    Continue celexa Slightly heightened in setting of recent hospitalization Using xanax sparingly Refill today Due for 101m controlled substance f/u - scheduled       Relevant Medications   citalopram (CELEXA) 40 MG tablet   ALPRAZolam (XANAX) 0.5 MG tablet   Snoring    Home sleep study is at her home and she will complete this weekend Treatment pending results Concern for possible OSA      Syncope    Episode of work of cyanosis and being found unconscious thought to be 2/2 vasovagal syncope  Still completing heart montior and upcoming Cards /fu and stress testing No further episodes Encouraged to continue with Cards w/u as scheduled      Other Visit Diagnoses     Acute anxiety       Relevant Medications   citalopram (CELEXA) 40 MG tablet   ALPRAZolam (XANAX) 0.5 MG tablet        No follow-ups on file.      Total time spent on today's visit was greater than 40 minutes, including both face-to-face time and nonface-to-face time personally spent on review of chart (labs and imaging), discussing labs and goals, discussing further work-up, treatment options, rform  completion, reviewing hospital records, answering patient's questions, and coordinating care.   I, Lavon Paganini, MD, have reviewed all documentation for this visit. The documentation on 12/28/21 for the exam, diagnosis, procedures, and orders are all accurate and complete.   Harolyn Cocker, Dionne Bucy, MD, MPH [facility]

## 2021-12-28 NOTE — Assessment & Plan Note (Signed)
Well controlled Continue current medications Reviewed recent metabolic panel 

## 2021-12-28 NOTE — Assessment & Plan Note (Signed)
Episode of work of cyanosis and being found unconscious thought to be 2/2 vasovagal syncope  Still completing heart montior and upcoming Cards /fu and stress testing No further episodes Encouraged to continue with Cards w/u as scheduled

## 2021-12-30 ENCOUNTER — Encounter: Payer: Self-pay | Admitting: Family Medicine

## 2022-01-01 ENCOUNTER — Ambulatory Visit: Payer: Self-pay | Admitting: Family Medicine

## 2022-01-07 ENCOUNTER — Ambulatory Visit: Payer: 59

## 2022-01-07 DIAGNOSIS — R7989 Other specified abnormal findings of blood chemistry: Secondary | ICD-10-CM

## 2022-01-07 DIAGNOSIS — R55 Syncope and collapse: Secondary | ICD-10-CM

## 2022-01-07 DIAGNOSIS — I2510 Atherosclerotic heart disease of native coronary artery without angina pectoris: Secondary | ICD-10-CM

## 2022-01-14 ENCOUNTER — Ambulatory Visit: Payer: 59 | Admitting: Cardiology

## 2022-01-15 MED ORDER — SUMATRIPTAN SUCCINATE 50 MG PO TABS: 50.0000 mg | ORAL_TABLET | ORAL | 0 refills | Status: DC | PRN

## 2022-01-16 ENCOUNTER — Ambulatory Visit: Payer: 59 | Admitting: Cardiology

## 2022-01-16 NOTE — Progress Notes (Deleted)
   Follow up visit  Subjective:   Kristin Aguirre, female    DOB: 02-Nov-1960, 61 y.o.   MRN: [id number]    *** HPI  No chief complaint on file.    61 y.o. Caucasian female  with hypertension, hyperlipidemia, GERD, former cigarette smoker, currently vapes incessantly (sleeps with vape in her hand), ongoing workup for possible OSA, admitted with possible syncope, ?brief cardiac arrest, troponin elevation  ***  ***  Current Outpatient Medications:    ALPRAZolam (XANAX) 0.5 MG tablet, Take 1 tablet (0.5 mg total) by mouth 2 (two) times daily as needed. Due for 6 month controlled substance office visit. Please call and schedule an appointment., Disp: 30 tablet, Rfl: 0   aspirin EC 81 MG tablet, Take 81 mg by mouth daily. Swallow whole., Disp: , Rfl:    citalopram (CELEXA) 40 MG tablet, Take 1 tablet (40 mg total) by mouth at bedtime., Disp: 90 tablet, Rfl: 3   fluticasone (FLONASE) 50 MCG/ACT nasal spray, Place 2 sprays into both nostrils daily. (Patient taking differently: Place 2 sprays into both nostrils as needed.), Disp: 48 g, Rfl: 1   lisinopril (ZESTRIL) 10 MG tablet, Take 1 tablet (10 mg total) by mouth daily., Disp: 90 tablet, Rfl: 3   nicotine (NICODERM CQ - DOSED IN MG/24 HOURS) 14 mg/24hr patch, Place 1 patch (14 mg total) onto the skin daily., Disp: 28 patch, Rfl: 0   ondansetron (ZOFRAN) 4 MG tablet, Take 1 tablet (4 mg total) by mouth daily as needed for nausea or vomiting., Disp: 30 tablet, Rfl: 1   oxyCODONE-acetaminophen (PERCOCET) 7.5-325 MG tablet, Take 1 tablet by mouth every 6 (six) hours as needed for severe pain., Disp: 90 tablet, Rfl: 0   rosuvastatin (CRESTOR) 10 MG tablet, Take 1 tablet (10 mg total) by mouth daily., Disp: 90 tablet, Rfl: 3   SUMAtriptan (IMITREX) 50 MG tablet, Take 1 tablet (50 mg total) by mouth every 2 (two) hours as needed for migraine (2 doses max per day). May repeat in 2 hours if headache persists or recurs., Disp: 20 tablet, Rfl: 0   Vitamin  D, Ergocalciferol, (DRISDOL) 1.25 MG (50000 UNIT) CAPS capsule, Take 1 capsule (50,000 Units total) by mouth every 7 (seven) days., Disp: 5 capsule, Rfl: 0   Cardiovascular & other pertient studies:  Reviewed external labs and tests, independently interpreted  *** EKG ***/***/202***: ***  ***  Recent labs: ***/***/202***: Glucose ***, BUN/Cr ***/***. EGFR ***. Na/K ***/***. ***Rest of the CMP normal H/H ***/***. MCV ***. Platelets *** ***HbA1C ***% Chol ***, TG ***, HDL ***, LDL *** ***TSH ***normal   *** ROS      *** There were no vitals filed for this visit.  There is no height or weight on file to calculate BMI. There were no vitals filed for this visit.  *** Objective:   Physical Exam          Visit diagnoses: No diagnosis found.   No orders of the defined types were placed in this encounter.    Medication changes this visit: There are no discontinued medications.  No orders of the defined types were placed in this encounter.    Assessment & Recommendations:   ***       Nigel Mormon, MD Pager: [phone] Office: [phone]

## 2022-01-18 ENCOUNTER — Encounter: Payer: Self-pay | Admitting: Family Medicine

## 2022-01-19 ENCOUNTER — Other Ambulatory Visit: Payer: Self-pay | Admitting: Family Medicine

## 2022-01-19 DIAGNOSIS — R0683 Snoring: Secondary | ICD-10-CM

## 2022-01-19 DIAGNOSIS — G43019 Migraine without aura, intractable, without status migrainosus: Secondary | ICD-10-CM

## 2022-01-23 ENCOUNTER — Other Ambulatory Visit: Payer: Self-pay | Admitting: Family Medicine

## 2022-01-23 MED ORDER — OXYCODONE-ACETAMINOPHEN 7.5-325 MG PO TABS: 1.0000 | ORAL_TABLET | Freq: Four times a day (QID) | ORAL | 0 refills | Status: DC | PRN

## 2022-01-25 ENCOUNTER — Ambulatory Visit: Payer: 59 | Admitting: Family Medicine

## 2022-01-28 ENCOUNTER — Encounter: Payer: Self-pay | Admitting: Family Medicine

## 2022-01-28 NOTE — Progress Notes (Unsigned)
MyChart Video Visit  Virtual Visit via Video Note   This format is felt to be most appropriate for this patient at this time. Physical exam was limited by quality of the video and audio technology used for the visit.   Patient location: work Restaurant manager, fast food location:  [facility] [address]  [address] [place], [place] [address]   I discussed the limitations of evaluation and management by telemedicine and the availability of in person appointments. The patient expressed understanding and agreed to proceed.  Patient: Kristin Aguirre   DOB: Jul 12, 1960   61 y.o. Female  MRN: [id number] Visit Date: 01/30/2022  Today's healthcare provider: Jacky Kindle, FNP  Re Introduced to nurse practitioner role and practice setting.  All questions answered.  Discussed provider/patient relationship and expectations.  I,Kolter Reaver J Tigerlily Christine,acting as a scribe for Jacky Kindle, FNP.,have documented all relevant documentation on the behalf of Jacky Kindle, FNP,as directed by  Jacky Kindle, FNP while in the presence of Jacky Kindle, FNP.   Chief Complaint  Patient presents with   Hypertension   Hyperlipidemia   Migraine   Anxiety   Back Pain   Subjective    HPI  Anxiety, Follow-up  She was last seen for anxiety 6 months ago. Changes made at last visit include no changes.   She reports excellent compliance with treatment. She reports excellent tolerance of treatment. She is not having side effects.   She feels her anxiety is severe and Worse since last visit.  Symptoms: No chest pain No difficulty concentrating  No dizziness Yes fatigue  No feelings of losing control No insomnia  No irritable No palpitations  No panic attacks No racing thoughts  No shortness of breath No sweating  No tremors/shakes    GAD-7 Results    01/30/2022    3:27 PM 08/28/2016   10:08 AM  GAD-7 Generalized Anxiety Disorder Screening Tool  1. Feeling Nervous, Anxious, or on Edge 0 1  2. Not  Being Able to Stop or Control Worrying 0 0  3. Worrying Too Much About Different Things 0 1  4. Trouble Relaxing 0 0  5. Being So Restless it's Hard To Sit Still 0 0  6. Becoming Easily Annoyed or Irritable 0 0  7. Feeling Afraid As If Something Awful Might Happen 0 0  Total GAD-7 Score 0 2  Difficulty At Work, Home, or Getting  Along With Others? Not difficult at all Not difficult at all    PHQ-9 Scores    01/30/2022    3:25 PM 12/03/2021   11:42 AM 06/20/2021    4:00 PM  PHQ9 SCORE ONLY  PHQ-9 Total Score 3 3 3    ---------------------------------------------------------------------------------------------------  Hypertension, follow-up  BP Readings from Last 3 Encounters:  12/28/21 120/85  12/17/21 128/66  08/24/21 (!) 148/90   Wt Readings from Last 3 Encounters:  12/28/21 226 lb (102.5 kg)  12/14/21 232 lb 2.3 oz (105.3 kg)  12/03/21 200 lb (90.7 kg)     She was last seen for hypertension 6 months ago.  BP at that visit was 137/66. Management since that visit includes continue medications.  She reports excellent compliance with treatment. She is not having side effects.  She is following a Regular diet. She is exercising. She does not smoke.  Use of agents associated with hypertension: none.   Outside blood pressures are not checked. Symptoms: No chest pain No chest pressure  No palpitations No syncope  No dyspnea  No orthopnea  No paroxysmal nocturnal dyspnea No lower extremity edema   Pertinent labs Lab Results  Component Value Date   CHOL 235 (H) 10/20/2020   HDL 49 10/20/2020   LDLCALC 143 (H) 10/20/2020   TRIG 239 (H) 10/20/2020   CHOLHDL 4.8 (H) 10/20/2020   Lab Results  Component Value Date   NA 140 12/17/2021   K 3.5 12/17/2021   CREATININE 0.66 12/17/2021   GFRNONAA >60 12/17/2021   GLUCOSE 91 12/17/2021   TSH 0.693 10/20/2020     The 10-year ASCVD risk score (Arnett DK, et al., 2019) is:  5.7%  --------------------------------------------------------------------------------------------------- Lipid/Cholesterol, Follow-up  Last lipid panel Other pertinent labs  Lab Results  Component Value Date   CHOL 235 (H) 10/20/2020   HDL 49 10/20/2020   LDLCALC 143 (H) 10/20/2020   TRIG 239 (H) 10/20/2020   CHOLHDL 4.8 (H) 10/20/2020   Lab Results  Component Value Date   ALT 18 12/13/2021   AST 18 12/13/2021   PLT 228 12/16/2021   TSH 0.693 10/20/2020     She was last seen for this 6 months ago.  Management since that visit includes no changes.  She reports excellent compliance with treatment. She is not having side effects.   Symptoms: No chest pain No chest pressure/discomfort  No dyspnea No lower extremity edema  No numbness or tingling of extremity No orthopnea  No palpitations No paroxysmal nocturnal dyspnea  No speech difficulty No syncope   Current diet: in general, an "unhealthy" diet Current exercise: not asked  The 10-year ASCVD risk score (Arnett DK, et al., 2019) is: 5.7%   -----------------------------------------------------------------------------------------  ---------------------------------------------------------------------------------------------------    Medications: Outpatient Medications Prior to Visit  Medication Sig   aspirin EC 81 MG tablet Take 81 mg by mouth daily. Swallow whole.   citalopram (CELEXA) 40 MG tablet Take 1 tablet (40 mg total) by mouth at bedtime.   fluticasone (FLONASE) 50 MCG/ACT nasal spray Place 2 sprays into both nostrils daily. (Patient taking differently: Place 2 sprays into both nostrils as needed.)   nicotine (NICODERM CQ - DOSED IN MG/24 HOURS) 14 mg/24hr patch Place 1 patch (14 mg total) onto the skin daily.   ondansetron (ZOFRAN) 4 MG tablet Take 1 tablet (4 mg total) by mouth daily as needed for nausea or vomiting.   SUMAtriptan (IMITREX) 50 MG tablet Take 1 tablet (50 mg total) by mouth every 2 (two) hours  as needed for migraine (2 doses max per day). May repeat in 2 hours if headache persists or recurs.   [DISCONTINUED] ALPRAZolam (XANAX) 0.5 MG tablet Take 1 tablet (0.5 mg total) by mouth 2 (two) times daily as needed. Due for 6 month controlled substance office visit. Please call and schedule an appointment.   [DISCONTINUED] lisinopril (ZESTRIL) 10 MG tablet Take 1 tablet (10 mg total) by mouth daily.   [DISCONTINUED] oxyCODONE-acetaminophen (PERCOCET) 7.5-325 MG tablet Take 1 tablet by mouth every 6 (six) hours as needed for severe pain.   [DISCONTINUED] rosuvastatin (CRESTOR) 10 MG tablet Take 1 tablet (10 mg total) by mouth daily.   [DISCONTINUED] Vitamin D, Ergocalciferol, (DRISDOL) 1.25 MG (50000 UNIT) CAPS capsule Take 1 capsule (50,000 Units total) by mouth every 7 (seven) days.   No facility-administered medications prior to visit.    Review of Systems   Objective    LMP 03/18/2001 (Approximate)   Physical Exam Constitutional:      Appearance: Normal appearance.  Pulmonary:     Effort: Pulmonary effort is  normal.  Neurological:     General: No focal deficit present.     Mental Status: She is alert and oriented to person, place, and time.  Psychiatric:        Mood and Affect: Mood normal.        Behavior: Behavior normal.        Thought Content: Thought content normal.        Judgment: Judgment normal.      Assessment & Plan     Problem List Items Addressed This Visit       Cardiovascular and Mediastinum   Essential hypertension    Chronic, stable Due for refills of lisinopril 10      Relevant Medications   lisinopril (ZESTRIL) 10 MG tablet   rosuvastatin (CRESTOR) 10 MG tablet     Musculoskeletal and Integument   Primary osteoarthritis of both knees    Patient is aware of risks of opioid medication use to include increased sedation, respiratory suppression, falls, dependence and cardiovascular events.  Patient would like to continue treatment as benefit  determined to outweigh risk.    Request for refills of percocet      Relevant Medications   oxyCODONE-acetaminophen (PERCOCET) 7.5-325 MG tablet     Other   Anxiety - Primary    Chronic, stable Request for refills of xanax prn  Pt is aware of risks of psychoactive medication use to include increased sedation, respiratory suppression, falls, extrapyramidal movements,  dependence and cardiovascular events.  Pt would like to continue treatment as benefit determined to outweigh risk.         Relevant Medications   ALPRAZolam (XANAX) 0.5 MG tablet   Avitaminosis D    Request for further supplementation to assist       Relevant Medications   Vitamin D, Ergocalciferol, (DRISDOL) 1.25 MG (50000 UNIT) CAPS capsule   Chronic prescription benzodiazepine use   Relevant Medications   ALPRAZolam (XANAX) 0.5 MG tablet   Chronic, continuous use of opioids   Relevant Medications   oxyCODONE-acetaminophen (PERCOCET) 7.5-325 MG tablet   Hyperlipidemia    Chronic, stable Due for crestor 10 refill      Relevant Medications   lisinopril (ZESTRIL) 10 MG tablet   rosuvastatin (CRESTOR) 10 MG tablet   No follow-ups on file.     I discussed the assessment and treatment plan with the patient. The patient was provided an opportunity to ask questions and all were answered. The patient agreed with the plan and demonstrated an understanding of the instructions.   The patient was advised to call back or seek an in-person evaluation if the symptoms worsen or if the condition fails to improve as anticipated.  I provided 15 minutes of non-face-to-face time during this encounter discussing current and chronic symptoms.  Vonna Kotyk, FNP, have reviewed all documentation for this visit. The documentation on 01/30/22 for the exam, diagnosis, procedures, and orders are all accurate and complete.  Gwyneth Sprout, [place] [phone] (phone) [phone] (fax)  [place]

## 2022-01-30 ENCOUNTER — Telehealth (INDEPENDENT_AMBULATORY_CARE_PROVIDER_SITE_OTHER): Payer: 59 | Admitting: Family Medicine

## 2022-01-30 DIAGNOSIS — M170 Bilateral primary osteoarthritis of knee: Secondary | ICD-10-CM | POA: Diagnosis not present

## 2022-01-30 DIAGNOSIS — E7800 Pure hypercholesterolemia, unspecified: Secondary | ICD-10-CM | POA: Diagnosis not present

## 2022-01-30 DIAGNOSIS — F419 Anxiety disorder, unspecified: Secondary | ICD-10-CM

## 2022-01-30 DIAGNOSIS — I10 Essential (primary) hypertension: Secondary | ICD-10-CM | POA: Diagnosis not present

## 2022-01-30 DIAGNOSIS — E559 Vitamin D deficiency, unspecified: Secondary | ICD-10-CM

## 2022-01-30 DIAGNOSIS — F1190 Opioid use, unspecified, uncomplicated: Secondary | ICD-10-CM

## 2022-01-30 DIAGNOSIS — Z79899 Other long term (current) drug therapy: Secondary | ICD-10-CM

## 2022-01-30 MED ORDER — VITAMIN D (ERGOCALCIFEROL) 1.25 MG (50000 UNIT) PO CAPS: 50000.0000 [IU] | ORAL_CAPSULE | ORAL | 0 refills | Status: DC

## 2022-01-30 MED ORDER — LISINOPRIL 10 MG PO TABS: 10.0000 mg | ORAL_TABLET | Freq: Every day | ORAL | 1 refills | Status: DC

## 2022-01-30 MED ORDER — ALPRAZOLAM 0.5 MG PO TABS: 0.5000 mg | ORAL_TABLET | Freq: Every day | ORAL | 0 refills | Status: DC | PRN

## 2022-01-30 MED ORDER — OXYCODONE-ACETAMINOPHEN 7.5-325 MG PO TABS: 1.0000 | ORAL_TABLET | Freq: Three times a day (TID) | ORAL | 0 refills | Status: DC | PRN

## 2022-01-30 MED ORDER — ROSUVASTATIN CALCIUM 10 MG PO TABS: 10.0000 mg | ORAL_TABLET | Freq: Every day | ORAL | 1 refills | Status: DC

## 2022-01-30 NOTE — Assessment & Plan Note (Signed)
Chronic, stable Request for refills of xanax prn  Pt is aware of risks of psychoactive medication use to include increased sedation, respiratory suppression, falls, extrapyramidal movements,  dependence and cardiovascular events.  Pt would like to continue treatment as benefit determined to outweigh risk.

## 2022-01-30 NOTE — Assessment & Plan Note (Signed)
Chronic, stable Due for refills of lisinopril 10

## 2022-01-30 NOTE — Assessment & Plan Note (Signed)
Request for further supplementation to assist

## 2022-01-30 NOTE — Assessment & Plan Note (Signed)
Chronic, stable Due for crestor 10 refill

## 2022-01-30 NOTE — Assessment & Plan Note (Signed)
Patient is aware of risks of opioid medication use to include increased sedation, respiratory suppression, falls, dependence and cardiovascular events.  Patient would like to continue treatment as benefit determined to outweigh risk.    Request for refills of percocet

## 2022-02-06 ENCOUNTER — Other Ambulatory Visit (HOSPITAL_COMMUNITY): Payer: Self-pay

## 2022-02-06 ENCOUNTER — Encounter: Payer: Self-pay | Admitting: Family Medicine

## 2022-02-06 ENCOUNTER — Other Ambulatory Visit: Payer: Self-pay | Admitting: Family Medicine

## 2022-02-06 DIAGNOSIS — G43019 Migraine without aura, intractable, without status migrainosus: Secondary | ICD-10-CM

## 2022-02-06 MED ORDER — SUMATRIPTAN SUCCINATE 50 MG PO TABS
50.0000 mg | ORAL_TABLET | ORAL | 0 refills | Status: DC | PRN
  Filled 2022-02-06: qty 20, 30d supply, fill #0
  Filled 2022-02-20 – 2022-03-06 (×2): qty 20, 10d supply, fill #0

## 2022-02-20 ENCOUNTER — Other Ambulatory Visit (HOSPITAL_COMMUNITY): Payer: Self-pay

## 2022-02-26 ENCOUNTER — Other Ambulatory Visit: Payer: Self-pay | Admitting: Family Medicine

## 2022-02-26 ENCOUNTER — Encounter: Payer: Self-pay | Admitting: Family Medicine

## 2022-02-26 DIAGNOSIS — M170 Bilateral primary osteoarthritis of knee: Secondary | ICD-10-CM

## 2022-02-26 DIAGNOSIS — F419 Anxiety disorder, unspecified: Secondary | ICD-10-CM

## 2022-02-26 DIAGNOSIS — F1190 Opioid use, unspecified, uncomplicated: Secondary | ICD-10-CM

## 2022-02-26 DIAGNOSIS — Z79899 Other long term (current) drug therapy: Secondary | ICD-10-CM

## 2022-02-26 MED ORDER — OXYCODONE-ACETAMINOPHEN 7.5-325 MG PO TABS: 1.0000 | ORAL_TABLET | Freq: Three times a day (TID) | ORAL | 0 refills | Status: DC | PRN

## 2022-02-26 MED ORDER — ALPRAZOLAM 0.5 MG PO TABS: 0.5000 mg | ORAL_TABLET | Freq: Every day | ORAL | 0 refills | Status: DC | PRN

## 2022-03-02 ENCOUNTER — Other Ambulatory Visit (HOSPITAL_COMMUNITY): Payer: Self-pay

## 2022-03-06 ENCOUNTER — Other Ambulatory Visit (HOSPITAL_COMMUNITY): Payer: Self-pay

## 2022-03-06 ENCOUNTER — Other Ambulatory Visit: Payer: Self-pay

## 2022-03-07 ENCOUNTER — Ambulatory Visit: Payer: 59 | Admitting: Cardiology

## 2022-03-21 ENCOUNTER — Other Ambulatory Visit: Payer: Self-pay

## 2022-03-21 ENCOUNTER — Other Ambulatory Visit (HOSPITAL_COMMUNITY): Payer: Self-pay

## 2022-03-21 ENCOUNTER — Other Ambulatory Visit: Payer: Self-pay | Admitting: Family Medicine

## 2022-03-21 DIAGNOSIS — G43019 Migraine without aura, intractable, without status migrainosus: Secondary | ICD-10-CM

## 2022-03-21 MED ORDER — SUMATRIPTAN SUCCINATE 50 MG PO TABS
50.0000 mg | ORAL_TABLET | ORAL | 0 refills | Status: DC | PRN
  Filled 2022-03-21: qty 30, 15d supply, fill #0
  Filled 2022-06-20 – 2022-08-19 (×2): qty 30, 15d supply, fill #1

## 2022-03-21 NOTE — Telephone Encounter (Signed)
Requested Prescriptions  Pending Prescriptions Disp Refills   SUMAtriptan (IMITREX) 50 MG tablet 180 tablet 0    Sig: Take 1 tablet by mouth as needed for migraine. May repeat in 2 hours if headache persists or recurs. Max 2 tablets per day.     Neurology:  Migraine Therapy - Triptan Passed - 03/21/2022 10:37 AM      Passed - Last BP in normal range    BP Readings from Last 1 Encounters:  12/28/21 120/85         Passed - Valid encounter within last 12 months    Recent Outpatient Visits           1 month ago [place] Tally Joe T, FNP   2 months ago Community acquired pneumonia of left lower lobe of lung   [facility], Dionne Bucy, MD   3 months ago [place], DO   9 months ago Chronic, continuous use of opioids   [facility] Gwyneth Sprout, FNP   1 year ago Colon cancer screening   [facility] Gwyneth Sprout, FNP       Future Appointments             In 1 month Gwyneth Sprout, [place], [place]

## 2022-03-22 ENCOUNTER — Other Ambulatory Visit (HOSPITAL_COMMUNITY): Payer: Self-pay

## 2022-03-23 ENCOUNTER — Other Ambulatory Visit (HOSPITAL_COMMUNITY): Payer: Self-pay

## 2022-03-27 ENCOUNTER — Other Ambulatory Visit: Payer: Self-pay | Admitting: Family Medicine

## 2022-03-27 DIAGNOSIS — F419 Anxiety disorder, unspecified: Secondary | ICD-10-CM

## 2022-03-27 DIAGNOSIS — M170 Bilateral primary osteoarthritis of knee: Secondary | ICD-10-CM

## 2022-03-27 DIAGNOSIS — F1190 Opioid use, unspecified, uncomplicated: Secondary | ICD-10-CM

## 2022-03-27 DIAGNOSIS — Z79899 Other long term (current) drug therapy: Secondary | ICD-10-CM

## 2022-03-27 MED ORDER — ALPRAZOLAM 0.5 MG PO TABS: 0.5000 mg | ORAL_TABLET | Freq: Every day | ORAL | 0 refills | Status: DC | PRN

## 2022-03-27 MED ORDER — OXYCODONE-ACETAMINOPHEN 7.5-325 MG PO TABS: 1.0000 | ORAL_TABLET | Freq: Three times a day (TID) | ORAL | 0 refills | Status: DC | PRN

## 2022-04-02 ENCOUNTER — Encounter: Payer: Self-pay | Admitting: Family Medicine

## 2022-04-18 ENCOUNTER — Telehealth: Payer: Self-pay | Admitting: Family Medicine

## 2022-04-18 NOTE — Telephone Encounter (Signed)
Pt is calling to report to call [facility] they need more information that just needing a sleep study. Needing a peer to peer.Needing more information. The patient snores at night, and stops breathing. Pt reports that she is so tired all the times.  CB- [phone]

## 2022-04-18 NOTE — Telephone Encounter (Signed)
Alyse from the sleep study center states that the pt has an appointment for 04/24/2022 and the patients insurance denied it. The insurance states they will cover an at home sleep study or peer to peer sleep study. Alyse is needing to know which one the PCP would like for the patient to do.   Please advise  [phone]

## 2022-04-22 ENCOUNTER — Encounter: Payer: Self-pay | Admitting: Family Medicine

## 2022-04-22 DIAGNOSIS — E669 Obesity, unspecified: Secondary | ICD-10-CM

## 2022-04-22 DIAGNOSIS — R0683 Snoring: Secondary | ICD-10-CM

## 2022-04-23 NOTE — Telephone Encounter (Signed)
Alyssa from [facility] called and asked for a Peer to Peer to be done for an in facility study / she spoke with pt this morning and pt is against an at home sleep test / please advise asap / the peer to peer phone number is [phone]

## 2022-04-24 ENCOUNTER — Other Ambulatory Visit: Payer: Self-pay | Admitting: Family Medicine

## 2022-04-24 DIAGNOSIS — Z79899 Other long term (current) drug therapy: Secondary | ICD-10-CM

## 2022-04-24 DIAGNOSIS — M170 Bilateral primary osteoarthritis of knee: Secondary | ICD-10-CM

## 2022-04-24 DIAGNOSIS — F419 Anxiety disorder, unspecified: Secondary | ICD-10-CM

## 2022-04-24 DIAGNOSIS — F1190 Opioid use, unspecified, uncomplicated: Secondary | ICD-10-CM

## 2022-04-24 MED ORDER — ALPRAZOLAM 0.5 MG PO TABS: 0.5000 mg | ORAL_TABLET | Freq: Every day | ORAL | 0 refills | Status: DC | PRN

## 2022-04-24 MED ORDER — OXYCODONE-ACETAMINOPHEN 7.5-325 MG PO TABS: 1.0000 | ORAL_TABLET | Freq: Three times a day (TID) | ORAL | 0 refills | Status: DC | PRN

## 2022-04-24 NOTE — Telephone Encounter (Signed)
Referral placed.

## 2022-04-30 ENCOUNTER — Encounter: Payer: 59 | Admitting: Family Medicine

## 2022-05-02 ENCOUNTER — Ambulatory Visit: Payer: 59

## 2022-05-04 ENCOUNTER — Encounter: Payer: Self-pay | Admitting: Family Medicine

## 2022-05-06 ENCOUNTER — Other Ambulatory Visit: Payer: Self-pay

## 2022-05-06 ENCOUNTER — Ambulatory Visit (INDEPENDENT_AMBULATORY_CARE_PROVIDER_SITE_OTHER): Payer: 59 | Admitting: Internal Medicine

## 2022-05-06 VITALS — BP 132/73 | HR 71 | Resp 12 | Ht 69.5 in | Wt 243.8 lb

## 2022-05-06 DIAGNOSIS — G4733 Obstructive sleep apnea (adult) (pediatric): Secondary | ICD-10-CM | POA: Diagnosis not present

## 2022-05-06 DIAGNOSIS — E669 Obesity, unspecified: Secondary | ICD-10-CM | POA: Insufficient documentation

## 2022-05-06 DIAGNOSIS — I10 Essential (primary) hypertension: Secondary | ICD-10-CM

## 2022-05-06 MED ORDER — ONDANSETRON HCL 4 MG PO TABS: 4.0000 mg | ORAL_TABLET | Freq: Every day | ORAL | 1 refills | Status: DC | PRN

## 2022-05-06 NOTE — Progress Notes (Unsigned)
Sleep Medicine   Office Visit  Patient Name: Kristin Aguirre DOB: 1961/01/27 MRN [id number]    Chief Complaint: sleep evaluation  Brief History:  Kristin Aguirre presents for an initial sleep consult with a several year history of excessive daytime sleepiness.  She reports that her sleep quality is very poor due to non restorative sleep. This is noted all nights. The patient's bed partner reports  witnessed apnea and loud snoring at night. The patient relates the following symptoms: snoring that wakes her, EDS, morning headaches, brain fog, lack of focus, sleepy driving , and lack of energy are also present. Patient drinks caffeine to stay away. The patient goes to sleep at 8:30-10pm and wakes up at 6:00am. Sleep quality is the same when outside home environment.  Patient has noted no restlessness of her legs at night.  The patient  relates no unusual behavior during the night.  The patient denies a history of psychiatric problems. The Epworth Sleepiness Score is 20 out of 24 .  The patient relates  Cardiovascular risk factors include: syncope, possible cardiac arrest, hypertension.  The patient reports 2023-01-09 that she coded at work, chart review indicates it is questionable as to whether this was cardiac arrest or syncope.  CPR was administered and she survived. The doctors were unable to find a cause for why this happened.    ROS  General: (-) fever, (-) chills, (-) night sweat Nose and Sinuses: (-) nasal stuffiness or itchiness, (-) postnasal drip, (-) nosebleeds, (-) sinus trouble. Mouth and Throat: (-) sore throat, (-) hoarseness. Neck: (-) swollen glands, (-) enlarged thyroid, (-) neck pain. Respiratory: - cough, - shortness of breath, - wheezing. Neurologic: - numbness, - tingling. Psychiatric: + anxiety, - depression Sleep behavior: -sleep paralysis -hypnogogic hallucinations -dream enactment      -vivid dreams -cataplexy -night terrors -sleep walking   Current Medication: Outpatient  Encounter Medications as of 05/06/2022  Medication Sig   ALPRAZolam (XANAX) 0.5 MG tablet Take 1 tablet (0.5 mg total) by mouth daily as needed.   aspirin EC 81 MG tablet Take 81 mg by mouth daily. Swallow whole.   citalopram (CELEXA) 40 MG tablet Take 1 tablet by mouth daily.   fluticasone (FLONASE) 50 MCG/ACT nasal spray Place 2 sprays into both nostrils daily. (Patient taking differently: Place 2 sprays into both nostrils as needed.)   lisinopril (ZESTRIL) 10 MG tablet Take 1 tablet (10 mg total) by mouth daily.   nicotine (NICODERM CQ - DOSED IN MG/24 HOURS) 14 mg/24hr patch Place 1 patch (14 mg total) onto the skin daily.   oxyCODONE-acetaminophen (PERCOCET) 7.5-325 MG tablet Take 1 tablet by mouth every 8 (eight) hours as needed for severe pain.   rosuvastatin (CRESTOR) 10 MG tablet Take 1 tablet (10 mg total) by mouth daily.   SUMAtriptan (IMITREX) 50 MG tablet Take 1 tablet by mouth as needed for migraine. May repeat in 2 hours if headache persists or recurs. Max 2 tablets per day.   Vitamin D, Ergocalciferol, (DRISDOL) 1.25 MG (50000 UNIT) CAPS capsule Take 1 capsule (50,000 Units total) by mouth every 7 (seven) days.   [DISCONTINUED] citalopram (CELEXA) 40 MG tablet Take 1 tablet (40 mg total) by mouth at bedtime.   No facility-administered encounter medications on file as of 05/06/2022.    Surgical History: Past Surgical History:  Procedure Laterality Date   ABDOMINAL HYSTERECTOMY  2003   BREAST BIOPSY Left 2011, 2015   neg   BREAST BIOPSY Left 07/17/2009   Vacuum  biopsy, 4:00 position: 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   cervical spine repair      CESAREAN SECTION     TONSILLECTOMY      Medical History: Past Medical History:  Diagnosis Date   Acute pyelonephritis 08/13/2021   Allergy    Anxiety    Colon cancer screening 10/20/2020   COVID-19 12/29/2020   Diffuse cystic mastopathy     LEFT SIDE   GERD (gastroesophageal reflux disease)    High cholesterol    PONV (postoperative nausea and vomiting)    Sepsis ([place]) 08/13/2021    Family History: Non contributory to the present illness  Social History: Social History   Socioeconomic History   Marital status: Single    Spouse name: Not on file   Number of children: Not on file   Years of education: Not on file   Highest education level: Not on file  Occupational History   Not on file  Tobacco Use   Smoking status: Former    Packs/day: 0.25    Types: Cigarettes    Start date: 04/03/2012    Quit date: 07/16/2021    Years since quitting: 0.8   Smokeless tobacco: Never   Tobacco comments:    SMokes 1 cigarettes daily  Vaping Use   Vaping Use: Some days  Substance and Sexual Activity   Alcohol use: Yes    Alcohol/week: 5.0 standard drinks of alcohol    Types: 5 Standard drinks or equivalent per week   Drug use: No   Sexual activity: Not on file  Other Topics Concern   Not on file  Social History Narrative   Not on file   Social Determinants of Health   Financial Resource Strain: Not on file  Food Insecurity: Not on file  Transportation Needs: Not on file  Physical Activity: Not on file  Stress: Not on file  Social Connections: Not on file  Intimate Partner Violence: Not on file    Vital Signs: Blood pressure 132/73, pulse 71, resp. rate 12, height 5' 9.5" (1.765 m), weight 243 lb 12.8 oz (110.6 kg), last menstrual period 03/18/2001, SpO2 95 %. Body mass index is 35.49 kg/m.   Examination: General Appearance: The patient is well-developed, well-nourished, and in no distress. Neck Circumference: 40cm Skin: Gross inspection of skin unremarkable. Head: normocephalic, no gross deformities. Eyes: no gross deformities noted. ENT: ears appear grossly normal Neurologic: Alert and oriented. No involuntary movements.    STOP BANG RISK ASSESSMENT S (snore) Have you been told that you snore?      YES   T (tired) Are you often tired, fatigued, or sleepy during the day?   YES  O (obstruction) Do you stop breathing, choke, or gasp during sleep? YES   P (pressure) Do you have or are you being treated for high blood pressure? YES   B (BMI) Is your body index greater than 35 kg/m? YES   A (age) Are you 64 years old or older? YES   N (neck) Do you have a neck circumference greater than 16 inches?   NO   G (gender) Are you a female? NO   TOTAL STOP/BANG "YES" ANSWERS 6  A STOP-Bang score of 2 or less is considered low risk, and a score of 5 or more is high risk for having either moderate or severe OSA. For people who score 3 or 4, doctors may need to perform further assessment to determine how likely they are to have OSA.         EPWORTH SLEEPINESS SCALE:  Scale:  (0)= no chance of dozing; (1)= slight chance of dozing; (2)= moderate chance of dozing; (3)= high chance of dozing  Chance  Situtation    Sitting and reading: 3    Watching TV: 3    Sitting Inactive in public: 2    As a passenger in car: 3      Lying down to rest: 3    Sitting and talking: 2    Sitting quielty after lunch: 3    In a car, stopped in traffic: 1   TOTAL SCORE:   20 out of 24    SLEEP STUDIES:  none   LABS: No results found for this or any previous visit (from the past 2160 hour(s)).  Radiology: ECHOCARDIOGRAM COMPLETE  Result Date: 12/14/2021    ECHOCARDIOGRAM REPORT   Patient Name:   ALAZE HARDIN Date of Exam: 12/14/2021 Medical Rec #:  [id number]      Height:       69.0 in Accession #:    [id number]     Weight:       200.0 lb Date of Birth:  1960/09/29      BSA:          2.066 m Patient Age:    89 years       BP:           133/73 mmHg Patient Gender: F              HR:           72 bpm. Exam Location:  Inpatient Procedure: 2D Echo, Color Doppler and Cardiac Doppler Indications:    Syncope  History:        Patient has no  prior history of Echocardiogram examinations.                 Risk Factors:Dyslipidemia.  Sonographer:    Memory Argue Referring Phys: [id number] [place]  1. Left ventricular ejection fraction, by estimation, is 60 to 65%. The left ventricle has normal function. The left ventricle has no regional wall motion abnormalities. Left ventricular diastolic parameters were normal.  2. Right ventricular systolic function is normal. The right ventricular size is normal. Tricuspid regurgitation signal is inadequate for assessing PA pressure.  3. The mitral valve is normal in structure. No evidence of mitral valve regurgitation. No evidence of mitral stenosis.  4. The aortic valve is tricuspid. Aortic valve regurgitation is not visualized. No aortic stenosis is present.  5. The inferior vena cava is normal in size with greater than 50% respiratory variability, suggesting right atrial pressure of 3 mmHg. Comparison(s): No prior Echocardiogram. FINDINGS  Left Ventricle: Left ventricular ejection fraction, by estimation, is 60 to 65%. The left ventricle has normal function. The left ventricle has no regional wall motion abnormalities. The left ventricular internal cavity size was normal in size. There is  no left ventricular hypertrophy. Left ventricular diastolic parameters were normal. Right Ventricle: The right ventricular size is normal. Right ventricular systolic function is normal. Tricuspid regurgitation signal is inadequate for assessing PA pressure. The tricuspid regurgitant velocity is 2.28 m/s, and with  an assumed right atrial  pressure of 3 mmHg, the estimated right ventricular systolic pressure is [id number]. Left Atrium: Left atrial size was normal in size. Right Atrium: Right atrial size was normal in size. Pericardium: There is no evidence of pericardial effusion. Mitral Valve: The mitral valve is normal in structure. No evidence of mitral valve regurgitation. No evidence of mitral valve  stenosis. Tricuspid Valve: The tricuspid valve is normal in structure. Tricuspid valve regurgitation is trivial. No evidence of tricuspid stenosis. Aortic Valve: The aortic valve is tricuspid. Aortic valve regurgitation is not visualized. No aortic stenosis is present. Aortic valve mean gradient measures 6.0 mmHg. Aortic valve peak gradient measures 10.9 mmHg. Aortic valve area, by VTI measures 2.84  cm. Pulmonic Valve: The pulmonic valve was normal in structure. Pulmonic valve regurgitation is not visualized. No evidence of pulmonic stenosis. Aorta: The aortic root is normal in size and structure. Venous: The inferior vena cava is normal in size with greater than 50% respiratory variability, suggesting right atrial pressure of 3 mmHg. IAS/Shunts: No atrial level shunt detected by color flow Doppler.  LEFT VENTRICLE PLAX 2D LVIDd:         4.40 cm   Diastology LVIDs:         2.90 cm   LV e' medial:    10.60 cm/s LV PW:         1.00 cm   LV E/e' medial:  9.5 LV IVS:        1.20 cm   LV e' lateral:   10.70 cm/s LVOT diam:     2.00 cm   LV E/e' lateral: 9.4 LV SV:         93 LV SV Index:   45 LVOT Area:     3.14 cm  RIGHT VENTRICLE RV S prime:     15.50 cm/s TAPSE (M-mode): 1.7 cm LEFT ATRIUM             Index        RIGHT ATRIUM          Index LA diam:        3.20 cm 1.55 cm/m   RA Area:     9.46 cm LA Vol (A2C):   37.5 ml 18.15 ml/m  RA Volume:   16.00 ml 7.75 ml/m LA Vol (A4C):   50.2 ml 24.30 ml/m LA Biplane Vol: 44.3 ml 21.44 ml/m  AORTIC VALVE AV Area (Vmax):    2.38 cm AV Area (Vmean):   2.37 cm AV Area (VTI):     2.84 cm AV Vmax:           165.00 cm/s AV Vmean:          113.000 cm/s AV VTI:            0.327 m AV Peak Grad:      10.9 mmHg AV Mean Grad:      6.0 mmHg LVOT Vmax:         125.00 cm/s LVOT Vmean:        85.200 cm/s LVOT VTI:          0.296 m LVOT/AV VTI ratio: 0.91  AORTA Ao Root diam: 3.30 cm Ao Asc diam:  3.20 cm MITRAL VALVE                TRICUSPID VALVE MV Area (PHT): 3.48 cm     TR  Peak grad:   20.8 mmHg MV Decel Time: 218 msec  TR Vmax:        228.00 cm/s MV E velocity: 101.00 cm/s MV A velocity: 116.00 cm/s  SHUNTS MV E/A ratio:  0.87         Systemic VTI:  0.30 m                             Systemic Diam: 2.00 cm Kirk Ruths MD Electronically signed by Kirk Ruths MD Signature Date/Time: 12/14/2021/12:51:02 PM    Final    EEG adult  Result Date: 12/14/2021 Lora Havens, MD     12/14/2021  7:58 AM Patient Name: SHERICE VATTER MRN: [id number] Epilepsy Attending: Lora Havens Referring Physician/Provider: Shela Leff, MD Date: 12/14/2021 Duration: 21.14 mins Patient history: 62yo F with syncope. EEG to evaluate for seizure. Level of alertness: Awake, asleep AEDs during EEG study: None Technical aspects: This EEG study was done with scalp electrodes positioned according to the 10-20 International system of electrode placement. Electrical activity was reviewed with band pass filter of 1-70Hz$ , sensitivity of 7 uV/mm, display speed of 4m/sec with a 60Hz$  notched filter applied as appropriate. EEG data were recorded continuously and digitally stored.  Video monitoring was available and reviewed as appropriate. Description: The posterior dominant rhythm consists of 8 Hz activity of moderate voltage (25-35 uV) seen predominantly in posterior head regions, symmetric and reactive to eye opening and eye closing.Sleep was characterized by vertex waves, maximal frontocentral region. Hyperventilation and photic stimulation were not performed.   IMPRESSION: This study is within normal limits. No seizures or epileptiform discharges were seen throughout the recording. A normal interictal EEG does not exclude nor support the diagnosis of epilepsy. PLora Havens  CT Angio Chest PE W and/or Wo Contrast  Result Date: 12/13/2021 CLINICAL DATA:  Pulmonary embolism (PE) suspected, positive D-dimer. Cardiopulmonary arrest EXAM: CT ANGIOGRAPHY CHEST WITH CONTRAST TECHNIQUE:  Multidetector CT imaging of the chest was performed using the standard protocol during bolus administration of intravenous contrast. Multiplanar CT image reconstructions and MIPs were obtained to evaluate the vascular anatomy. RADIATION DOSE REDUCTION: This exam was performed according to the departmental dose-optimization program which includes automated exposure control, adjustment of the mA and/or kV according to patient size and/or use of iterative reconstruction technique. CONTRAST:  811mOMNIPAQUE IOHEXOL 350 MG/ML SOLN COMPARISON:  04/11/2012 FINDINGS: Cardiovascular: Slightly suboptimal bolus timing and resultant opacification of the pulmonary arterial tree, however, the examination is still diagnostic for evaluation through the segmental level. No intraluminal filling defect identified to suggest acute pulmonary embolism. Central pulmonary arteries are of normal caliber. Mild coronary artery calcification. Global cardiac size within normal limits. No pericardial effusion. Mild atherosclerotic calcification within the thoracic aorta. No aortic aneurysm. Aberrant origin of the right subclavian artery. Mediastinum/Nodes: Multiple bilateral thyroid nodules are identified measuring up to 23 mm within the right thyroid lobe, stable since prior examination and unlikely of clinical significance given their stability over time. No follow-up imaging is recommended for these lesions. No pathologic thoracic adenopathy. Esophagus unremarkable. Lungs/Pleura: Multifocal ground-glass pulmonary infiltrates are seen scattered throughout the lungs, more focally within the left lower lobe, possibly infectious or inflammatory in nature as can be seen with atypical infection. No pneumothorax or pleural effusion. Central airways are widely patent. Upper Abdomen: No acute abnormality. Musculoskeletal: No chest wall abnormality. No acute or significant osseous findings. Review of the MIP images confirms the above findings.  IMPRESSION: 1. No pulmonary embolism. 2. Multifocal ground-glass pulmonary  infiltrates, more focally within the left lower lobe, possibly infectious or inflammatory in nature as can be seen with atypical infection. 3. Mild coronary artery calcification. Aortic Atherosclerosis (ICD10-I70.0). Electronically Signed   By: Fidela Salisbury M.D.   On: 12/13/2021 21:08   CT Head Wo Contrast  Result Date: 12/13/2021 CLINICAL DATA:  Head trauma, moderate-severe. Loss of consciousness. Received CPR. EXAM: CT HEAD WITHOUT CONTRAST TECHNIQUE: Contiguous axial images were obtained from the base of the skull through the vertex without intravenous contrast. RADIATION DOSE REDUCTION: This exam was performed according to the departmental dose-optimization program which includes automated exposure control, adjustment of the mA and/or kV according to patient size and/or use of iterative reconstruction technique. COMPARISON:  Head CT report from 07/21/1996 FINDINGS: Brain: There is no evidence of an acute infarct, intracranial hemorrhage, mass, midline shift, or extra-axial fluid collection. The ventricles and sulci are normal. Vascular: No hyperdense vessel. Skull: No fracture or suspicious osseous lesion. Sinuses/Orbits: Visualized paranasal sinuses and mastoid air cells are clear. Unremarkable orbits. Other: None. IMPRESSION: Negative head CT. Electronically Signed   By: Logan Bores M.D.   On: 12/13/2021 21:02    No results found.  No results found.    Assessment and Plan: Patient Active Problem List   Diagnosis Date Noted   OSA (obstructive sleep apnea) 05/06/2022   Obesity (BMI 30-39.9) 05/06/2022   Chronic prescription benzodiazepine use 06/20/2021   Chronic, continuous use of opioids 06/20/2021   Essential hypertension 05/08/2018   Primary osteoarthritis of both knees 04/11/2017   Avitaminosis D 01/31/2015   Anxiety 08/26/2014   Hyperlipidemia 01/14/2006   1. OSA (obstructive sleep apnea) PLAN OSA:    Patient evaluation suggests high risk of sleep disordered breathing due to snoring, daytime sleepiness, witnessed apnea.  Patient has comorbid cardiovascular risk factors including: hypertension, cardiac arrest, syncope  which could be exacerbated by pathologic sleep-disordered breathing.  Suggest: PSG to assess/treat the patient's sleep disordered breathing. The patient was also counselled on weight loss to optimize sleep health.   2. Essential hypertension Hypertension Counseling:   The following hypertensive lifestyle modification were recommended and discussed:  1. Limiting alcohol intake to less than 1 oz/day of ethanol:(24 oz of beer or 8 oz of wine or 2 oz of 100-proof whiskey). 2. Take baby ASA 81 mg daily. 3. Importance of regular aerobic exercise and losing weight. 4. Reduce dietary saturated fat and cholesterol intake for overall cardiovascular health. 5. Maintaining adequate dietary potassium, calcium, and magnesium intake. 6. Regular monitoring of the blood pressure. 7. Reduce sodium intake to less than 100 mmol/day (less than 2.3 gm of sodium or less than 6 gm of sodium choride)    3. Obesity (BMI 30-39.9) Obesity Counseling: Had a lengthy discussion regarding patients BMI and weight issues. Patient was instructed on portion control as well as increased activity. Also discussed caloric restrictions with trying to maintain intake less than 2000 Kcal. Discussions were made in accordance with the 5As of weight management. Simple actions such as not eating late and if able to, taking a walk is suggested.      General Counseling: I have discussed the findings of the evaluation and examination with Delainee.  I have also discussed any further diagnostic evaluation thatmay be needed or ordered today. Nakima verbalizes understanding of the findings of todays visit. We also reviewed her medications today and discussed drug interactions and side effects including but not limited excessive  drowsiness and altered mental states. We also discussed that there is  always a risk not just to her but also people around her. she has been encouraged to call the office with any questions or concerns that should arise related to todays visit.  No orders of the defined types were placed in this encounter.       I have personally obtained a history, evaluated the patient, evaluated pertinent data, formulated the assessment and plan and placed orders. This patient was seen today by Tressie Ellis, PA-C in collaboration with Dr. Devona Konig.     Allyne Gee, MD [facility] Diplomate ABMS Pulmonary and Critical Care Medicine Sleep medicine

## 2022-05-08 ENCOUNTER — Encounter: Payer: 59 | Admitting: Family Medicine

## 2022-05-09 ENCOUNTER — Encounter: Payer: Self-pay | Admitting: Family Medicine

## 2022-05-09 NOTE — Telephone Encounter (Signed)
Pt was scheduled at [facility] where she would like to go but they cancelled appointment because they are requesting a peer to peer at [phone] option 4  Case # [id number]

## 2022-05-21 ENCOUNTER — Other Ambulatory Visit: Payer: Self-pay | Admitting: Family Medicine

## 2022-05-21 ENCOUNTER — Encounter: Payer: 59 | Admitting: Family Medicine

## 2022-05-21 ENCOUNTER — Encounter: Payer: Self-pay | Admitting: Family Medicine

## 2022-05-21 DIAGNOSIS — Z79899 Other long term (current) drug therapy: Secondary | ICD-10-CM

## 2022-05-21 DIAGNOSIS — M170 Bilateral primary osteoarthritis of knee: Secondary | ICD-10-CM

## 2022-05-21 DIAGNOSIS — F419 Anxiety disorder, unspecified: Secondary | ICD-10-CM

## 2022-05-21 DIAGNOSIS — F1190 Opioid use, unspecified, uncomplicated: Secondary | ICD-10-CM

## 2022-05-21 NOTE — Telephone Encounter (Unsigned)
Copied from [place] [id number]. Topic: General - Other >> May 21, 2022  3:08 PM Everette C wrote: Reason for CRM: Medication Refill - Medication: ALPRAZolam (XANAX) 0.5 MG tablet   oxyCODONE-acetaminophen (PERCOCET) 7.5-325 MG tablet  Has the patient contacted their pharmacy? Yes.  The patient has experienced car trouble and been unable to make it to their previously scheduled physical  (Agent: If no, request that the patient contact the pharmacy for the refill. If patient does not wish to contact the pharmacy document the reason why and proceed with request.) (Agent: If yes, when and what did the pharmacy advise?)  Preferred Pharmacy (with phone number or street name): [facility] [place], [place] [address] Phone: [phone] Fax: [phone] Hours: Not open 24 hours   Has the patient been seen for an appointment in the last year OR does the patient have an upcoming appointment? Yes.    Agent: Please be advised that RX refills may take up to 3 business days. We ask that you follow-up with your pharmacy.

## 2022-05-21 NOTE — Telephone Encounter (Signed)
Requested medication (s) are due for refill today:no  Requested medication (s) are on the active medication list: yes    Last refill: Both meds    04/24/22  with 30 day supply  Future visit scheduled yes 06/04/22  Notes to clinic:Not delegated, please review. Thank you.  Requested Prescriptions  Pending Prescriptions Disp Refills   ALPRAZolam (XANAX) 0.5 MG tablet 30 tablet 0    Sig: Take 1 tablet (0.5 mg total) by mouth daily as needed.     Not Delegated - Psychiatry: Anxiolytics/Hypnotics 2 Failed - 05/21/2022  3:23 PM      Failed - This refill cannot be delegated      Passed - Urine Drug Screen completed in last 360 days      Passed - Patient is not pregnant      Passed - Valid encounter within last 6 months    Recent Outpatient Visits           3 months ago [place] Tally Joe T, FNP   4 months ago Community acquired pneumonia of left lower lobe of lung   [place], Dionne Bucy, MD   5 months ago Snoring   [facility] Rory Percy M, DO   11 months ago Chronic, continuous use of opioids   [facility] Tally Joe T, FNP   1 year ago Colon cancer screening   [place] Tally Joe T, FNP       Future Appointments             In 2 weeks Gwyneth Sprout, [place], [facility]             oxyCODONE-acetaminophen (PERCOCET) 7.5-325 MG tablet 90 tablet 0    Sig: Take 1 tablet by mouth every 8 (eight) hours as needed for severe pain.     Not Delegated - Analgesics:  Opioid Agonist Combinations Failed - 05/21/2022  3:23 PM      Failed - This refill cannot be delegated      Failed - Valid encounter within last 3 months    Recent Outpatient Visits           3 months ago [place] Tally Joe T, FNP   4 months ago Community acquired pneumonia  of left lower lobe of lung   [facility], Dionne Bucy, MD   5 months ago Snoring   [facility] Myles Gip, DO   11 months ago Chronic, continuous use of opioids   [facility] Tally Joe T, FNP   1 year ago Colon cancer screening   [place] Gwyneth Sprout, FNP       Future Appointments             In 2 weeks Gwyneth Sprout, [place], [place] completed in last 360 days

## 2022-05-21 NOTE — Telephone Encounter (Unsigned)
Copied from [place] [id number]. Topic: General - Other >> May 21, 2022  3:08 PM Everette C wrote: Reason for CRM: Medication Refill - Medication: ALPRAZolam (XANAX) 0.5 MG tablet   oxyCODONE-acetaminophen (PERCOCET) 7.5-325 MG tablet  Has the patient contacted their pharmacy? Yes.  The patient has experienced car trouble and been unable to make it to their previously scheduled physical  (Agent: If no, request that the patient contact the pharmacy for the refill. If patient does not wish to contact the pharmacy document the reason why and proceed with request.) (Agent: If yes, when and what did the pharmacy advise?)  Preferred Pharmacy (with phone number or street name): [facility] - [place], [place] [address] Phone: [phone] Fax: [phone] Hours: Not open 24 hours   Has the patient been seen for an appointment in the last year OR does the patient have an upcoming appointment? Yes.    Agent: Please be advised that RX refills may take up to 3 business days. We ask that you follow-up with your pharmacy.

## 2022-05-22 ENCOUNTER — Other Ambulatory Visit: Payer: Self-pay | Admitting: Family Medicine

## 2022-05-22 DIAGNOSIS — Z79899 Other long term (current) drug therapy: Secondary | ICD-10-CM

## 2022-05-22 DIAGNOSIS — F1190 Opioid use, unspecified, uncomplicated: Secondary | ICD-10-CM

## 2022-05-22 DIAGNOSIS — F419 Anxiety disorder, unspecified: Secondary | ICD-10-CM

## 2022-05-22 DIAGNOSIS — M170 Bilateral primary osteoarthritis of knee: Secondary | ICD-10-CM

## 2022-05-22 MED ORDER — OXYCODONE-ACETAMINOPHEN 7.5-325 MG PO TABS: 1.0000 | ORAL_TABLET | Freq: Three times a day (TID) | ORAL | 0 refills | Status: DC | PRN

## 2022-05-22 MED ORDER — ALPRAZOLAM 0.5 MG PO TABS: 0.5000 mg | ORAL_TABLET | Freq: Every day | ORAL | 0 refills | Status: DC | PRN

## 2022-06-04 ENCOUNTER — Telehealth: Payer: Self-pay

## 2022-06-04 ENCOUNTER — Encounter: Payer: 59 | Admitting: Family Medicine

## 2022-06-04 NOTE — Telephone Encounter (Signed)
Copied from [place] [id number]. 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 Pt rescheduled for April and is wanting to see if she cannot get billed for having to reschedule today due to her coworkers having to leave today with covid. Please advise.

## 2022-06-05 NOTE — Telephone Encounter (Signed)
No-Show fee will not be charged

## 2022-06-19 ENCOUNTER — Other Ambulatory Visit: Payer: Self-pay | Admitting: Family Medicine

## 2022-06-19 DIAGNOSIS — M170 Bilateral primary osteoarthritis of knee: Secondary | ICD-10-CM

## 2022-06-19 DIAGNOSIS — F1190 Opioid use, unspecified, uncomplicated: Secondary | ICD-10-CM

## 2022-06-19 NOTE — Progress Notes (Unsigned)
Complete physical exam  Patient: Kristin Aguirre   DOB: [birth year]   62 y.o. Female  MRN: [id number] Visit Date: 06/20/2022  Today's healthcare provider: Gwyneth Sprout, FNP  Re Introduced to nurse practitioner role and practice setting.  All questions answered.  Discussed provider/patient relationship and expectations.   Subjective    Kristin Aguirre is a 62 y.o. female who presents today for a complete physical exam.   HPI   Past Medical History:  Diagnosis Date   Acute pyelonephritis 08/13/2021   Allergy    Anxiety    Colon cancer screening 10/20/2020   COVID-19 12/29/2020   Diffuse cystic mastopathy    LEFT SIDE   GERD (gastroesophageal reflux disease)    High cholesterol    PONV (postoperative nausea and vomiting)    Sepsis 08/13/2021   Past Surgical History:  Procedure Laterality Date   ABDOMINAL HYSTERECTOMY  2003   BREAST BIOPSY Left 2011, 2015   neg   BREAST BIOPSY Left 07/17/2009   Vacuum biopsy, 4:00 position: 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   cervical spine repair      CESAREAN SECTION     TONSILLECTOMY     Social History   Socioeconomic History   Marital status: Single    Spouse name: Not on file   Number of children: Not on file   Years of education: Not on file   Highest education level: Not on file  Occupational History   Not on file  Tobacco Use   Smoking status: Former    Packs/day: .25    Types: Cigarettes    Start date: 04/03/2012    Quit date: 07/16/2021    Years since quitting: 0.9   Smokeless tobacco: Never   Tobacco comments:    SMokes 1 cigarettes daily  Vaping Use   Vaping Use: Some days  Substance and Sexual Activity   Alcohol use: Yes    Alcohol/week: 5.0 standard drinks of alcohol    Types: 5 Standard drinks or equivalent per week   Drug use: No   Sexual activity: Not on file  Other Topics Concern   Not on file  Social History  Narrative   Not on file   Social Determinants of Health   Financial Resource Strain: Not on file  Food Insecurity: Not on file  Transportation Needs: Not on file  Physical Activity: Not on file  Stress: Not on file  Social Connections: Not on file  Intimate Partner Violence: Not on file   Family Status  Relation Name Status   Mother  Deceased at age 36   Father  Deceased at age 27   Sister  Alive   Brother  Alive       spinal meningitits age 46   MGM  (Not Specified)   Family History  Problem Relation Age of Onset   Heart block Mother    Cancer Mother        on leg   Diabetes Mother    Heart disease Mother    Diabetes Father    Heart disease Father    CVA Father    Asthma Sister    Anemia Sister    Seizures Brother    Breast cancer Maternal Grandmother 27   Allergies  Allergen Reactions   Furosemide Rash    Patient Care Team: Gwyneth Sprout, FNP as PCP - General (Family Medicine) Bary Castilla, [place] Gleason, MD as Consulting Physician (General Surgery)  Rico Junker, RN as Registered Nurse Theodore Demark, RN (Inactive) as Registered Nurse   Medications: Outpatient Medications Prior to Visit  Medication Sig   ALPRAZolam (XANAX) 0.5 MG tablet Take 1 tablet (0.5 mg total) by mouth daily as needed.   aspirin EC 81 MG tablet Take 81 mg by mouth daily. Swallow whole.   citalopram (CELEXA) 40 MG tablet Take 1 tablet by mouth daily.   fluticasone (FLONASE) 50 MCG/ACT nasal spray Place 2 sprays into both nostrils daily. (Patient taking differently: Place 2 sprays into both nostrils as needed.)   lisinopril (ZESTRIL) 10 MG tablet Take 1 tablet (10 mg total) by mouth daily.   nicotine (NICODERM CQ - DOSED IN MG/24 HOURS) 14 mg/24hr patch Place 1 patch (14 mg total) onto the skin daily.   ondansetron (ZOFRAN) 4 MG tablet Take 1 tablet (4 mg total) by mouth daily as needed for nausea or vomiting.   oxyCODONE-acetaminophen (PERCOCET) 7.5-325 MG tablet Take 1 tablet by mouth  every 8 (eight) hours as needed for severe pain.   rosuvastatin (CRESTOR) 10 MG tablet Take 1 tablet (10 mg total) by mouth daily.   SUMAtriptan (IMITREX) 50 MG tablet Take 1 tablet by mouth as needed for migraine. May repeat in 2 hours if headache persists or recurs. Max 2 tablets per day.   Vitamin D, Ergocalciferol, (DRISDOL) 1.25 MG (50000 UNIT) CAPS capsule Take 1 capsule (50,000 Units total) by mouth every 7 (seven) days.   No facility-administered medications prior to visit.    Review of Systems  Constitutional: Negative.   HENT: Negative.    Eyes: Negative.   Respiratory: Negative.    Cardiovascular: Negative.   Gastrointestinal: Negative.   Endocrine: Negative.   Genitourinary: Negative.   Musculoskeletal:  Positive for arthralgias.  Skin: Negative.   Allergic/Immunologic: Negative.   Neurological:  Positive for weakness and headaches.  Hematological: Negative.   Psychiatric/Behavioral: Negative.        Objective    BP 129/63 (BP Location: Right Arm, Patient Position: Sitting, Cuff Size: Normal)   Pulse 68   Temp 98.6 F (37 C) (Oral)   Wt 243 lb (110.2 kg)   LMP 03/18/2001 (Approximate)   SpO2 100%   BMI 35.37 kg/m     Physical Exam Vitals and nursing note reviewed.  Constitutional:      General: She is awake. She is not in acute distress.    Appearance: Normal appearance. She is well-developed and well-groomed. She is obese. She is not ill-appearing, toxic-appearing or diaphoretic.  HENT:     Head: Normocephalic and atraumatic.     Jaw: There is normal jaw occlusion. No trismus, tenderness, swelling or pain on movement.     Right Ear: Hearing, tympanic membrane, ear canal and external ear normal. There is no impacted cerumen.     Left Ear: Hearing, tympanic membrane, ear canal and external ear normal. There is no impacted cerumen.     Nose: Nose normal. No congestion or rhinorrhea.     Right Turbinates: Not enlarged, swollen or pale.     Left Turbinates:  Not enlarged, swollen or pale.     Right Sinus: No maxillary sinus tenderness or frontal sinus tenderness.     Left Sinus: No maxillary sinus tenderness or frontal sinus tenderness.     Mouth/Throat:     Lips: Pink.     Mouth: Mucous membranes are moist. No injury.     Tongue: No lesions.     Pharynx: Oropharynx is  clear. Uvula midline. No pharyngeal swelling, oropharyngeal exudate, posterior oropharyngeal erythema or uvula swelling.     Tonsils: No tonsillar exudate or tonsillar abscesses.  Eyes:     General: Lids are normal. Lids are everted, no foreign bodies appreciated. Vision grossly intact. Gaze aligned appropriately. No allergic shiner or visual field deficit.       Right eye: No discharge.        Left eye: No discharge.     Extraocular Movements: Extraocular movements intact.     Conjunctiva/sclera: Conjunctivae normal.     Right eye: Right conjunctiva is not injected. No exudate.    Left eye: Left conjunctiva is not injected. No exudate.    Pupils: Pupils are equal, round, and reactive to light.  Neck:     Thyroid: No thyroid mass, thyromegaly or thyroid tenderness.     Vascular: No carotid bruit.     Trachea: Trachea normal.  Cardiovascular:     Rate and Rhythm: Normal rate and regular rhythm.     Pulses: Normal pulses.          Carotid pulses are 2+ on the right side and 2+ on the left side.      Radial pulses are 2+ on the right side and 2+ on the left side.       Dorsalis pedis pulses are 2+ on the right side and 2+ on the left side.       Posterior tibial pulses are 2+ on the right side and 2+ on the left side.     Heart sounds: Normal heart sounds, S1 normal and S2 normal. No murmur heard.    No friction rub. No gallop.  Pulmonary:     Effort: Pulmonary effort is normal. No respiratory distress.     Breath sounds: Normal breath sounds and air entry. No stridor. No wheezing, rhonchi or rales.  Chest:     Chest wall: No tenderness.  Abdominal:     General: Abdomen  is flat. Bowel sounds are normal. There is no distension.     Palpations: Abdomen is soft. There is no mass.     Tenderness: There is no abdominal tenderness. There is no right CVA tenderness, left CVA tenderness, guarding or rebound.     Hernia: No hernia is present.  Genitourinary:    Comments: Exam deferred; denies complaints Musculoskeletal:        General: No swelling, tenderness, deformity or signs of injury. Normal range of motion.     Cervical back: Full passive range of motion without pain, normal range of motion and neck supple. No edema, rigidity or tenderness. No muscular tenderness.     Right lower leg: No edema.     Left lower leg: No edema.  Lymphadenopathy:     Cervical: No cervical adenopathy.     Right cervical: No superficial, deep or posterior cervical adenopathy.    Left cervical: No superficial, deep or posterior cervical adenopathy.  Skin:    General: Skin is warm and dry.     Capillary Refill: Capillary refill takes less than 2 seconds.     Coloration: Skin is not jaundiced or pale.     Findings: No bruising, erythema, lesion or rash.  Neurological:     General: No focal deficit present.     Mental Status: She is alert and oriented to person, place, and time. Mental status is at baseline.     GCS: GCS eye subscore is 4. GCS verbal subscore is 5. GCS motor  subscore is 6.     Cranial Nerves: No cranial nerve deficit.     Sensory: Sensation is intact. No sensory deficit.     Motor: Motor function is intact. No weakness.     Coordination: Coordination is intact. Coordination normal.     Gait: Gait is intact. Gait normal.  Psychiatric:        Attention and Perception: Attention and perception normal.        Mood and Affect: Mood and affect normal.        Speech: Speech normal.        Behavior: Behavior normal. Behavior is cooperative.        Thought Content: Thought content normal.        Cognition and Memory: Cognition and memory normal.        Judgment:  Judgment normal.     Last depression screening scores    06/20/2022    3:19 PM 01/30/2022    3:25 PM 12/03/2021   11:42 AM  PHQ 2/9 Scores  PHQ - 2 Score 0 0 0  PHQ- 9 Score 2 3 3    Last fall risk screening    06/20/2022    3:19 PM  [place] in the past year? 0  Number falls in past yr: 0  Injury with Fall? 0   Last Audit-C alcohol use screening    06/20/2022    3:19 PM  Alcohol Use Disorder Test (AUDIT)  1. How often do you have a drink containing alcohol? 0  2. How many drinks containing alcohol do you have on a typical day when you are drinking? 0  3. How often do you have six or more drinks on one occasion? 0  AUDIT-C Score 0   A score of 3 or more in women, and 4 or more in men indicates increased risk for alcohol abuse, EXCEPT if all of the points are from question 1   No results found for any visits on 06/20/22.  Assessment & Plan    Routine Health Maintenance and Physical Exam  Exercise Activities and Dietary recommendations  Goals   None     Immunization History  Administered Date(s) Administered   Influenza,inj,Quad PF,6+ Mos 12/16/2012, 04/17/2016, 12/16/2021   Moderna Sars-Covid-2 Vaccination 07/13/2019   PNEUMOCOCCAL CONJUGATE-20 12/17/2021    Health Maintenance  Topic Date Due   DTaP/Tdap/Td (1 - Tdap) Never done   Zoster Vaccines- Shingrix (1 of 2) Never done   PAP SMEAR-Modifier  Never done   COLONOSCOPY (Pts 45-77yrs Insurance coverage will need to be confirmed)  Never done   COVID-19 Vaccine (2 - Moderna risk series) 08/10/2019   MAMMOGRAM  08/16/2022   INFLUENZA VACCINE  10/17/2022   Hepatitis C Screening  Completed   HIV Screening  Completed   HPV VACCINES  Aged Out    Discussed health benefits of physical activity, and encouraged her to engage in regular exercise appropriate for her age and condition.  Problem List Items Addressed This Visit       Cardiovascular and Mediastinum   Essential hypertension    Chronic,  stable Goal <139/<79 Continue lisinopril 10 mg         Other   Adjustment disorder with mixed anxiety and depressed mood    Acute, stable Denies SI or HI At max dose of celexa 40 mg and using xanax daily PRN at 0.5 mg      Annual physical exam - Primary    Due for  mammo, colon, PAP [deferred to GYN per pt request] Things to do to keep yourself healthy  - Exercise at least 30-45 minutes a day, 3-4 days a week.  - Eat a low-fat diet with lots of fruits and vegetables, up to 7-9 servings per day.  - Seatbelts can save your life. Wear them always.  - Smoke detectors on every level of your home, check batteries every year.  - Eye Doctor - have an eye exam every 1-2 years  - Safe sex - if you may be exposed to STDs, use a condom.  - Alcohol -  If you drink, do it moderately, less than 2 drinks per day.  - [place]. Choose someone to speak for you if you are not able.  - Depression is common in our stressful world.If you're feeling down or losing interest in things you normally enjoy, please come in for a visit.  - Violence - If anyone is threatening or hurting you, please call immediately. Continue dental and vision screenings as well       Avitaminosis D    Chronic, previously on supplementation Repeat Vit D labs      Relevant Orders   Vitamin D (25 hydroxy)   Cervical cancer screening declined    Wishes to f/u with GYN      Chronic low back pain without sciatica    Chronic, stable Remains on opioids to assist adjunct treatment       Chronic prescription benzodiazepine use    Last UDS 11/2021 Previously on xanax 0.5 mg daily to assist  Pt is aware of risks of psychoactive medication use to include increased sedation, respiratory suppression, falls, extrapyramidal movements,  dependence and cardiovascular events.  Pt would like to continue treatment as benefit determined to outweigh risk.        Relevant Orders   Drug Screen 12+Alcohol+CRT, Ur    Urinalysis, Routine w reflex microscopic   Chronic, continuous use of opioids    UDS 11/2021 Previously on 3# perc 7.5 mg daily PRN  Pt is aware of risks of opioid medication use to include increased sedation, respiratory suppression, falls, dependence and cardiovascular events. Pt would like to continue treatment as benefit determined to outweigh risk.          Relevant Orders   Drug Screen 12+Alcohol+CRT, Ur   Urinalysis, Routine w reflex microscopic   Elevated serum glucose    Repeat A1c Continue to recommend balanced, lower carb meals. Smaller meal size, adding snacks. Choosing water as drink of choice and increasing purposeful exercise.       Relevant Orders   Hemoglobin A1c   Hyperlipidemia    Chronic, previously elevated Continue crestor 10 mg Repeat LP recommend diet low in saturated fat and regular exercise - 30 min at least 5 times per week       Relevant Orders   Lipid panel   Iron deficiency anemia    Chronic, endorses fatigue; also under undue stress given relationship concerns Denies concern for DA/DV Repeat CBC      Relevant Orders   CBC with Differential/Platelet   Obesity (BMI 30-39.9)    Chronic, worsening Morbid now with Body mass index is 35.37 kg/m. Associated with depression, anxiety, HTN, HLD      Screen for colon cancer    Due for colon cancer screening; referral placed       Relevant Orders   Ambulatory referral to Gastroenterology   Screening mammogram for breast cancer  Due for screening for mammogram, denies breast concerns, provided with phone number to call and schedule appointment for mammogram. Encouraged to repeat breast cancer screening every 1-2 years.       Relevant Orders   MM 3D SCREENING MAMMOGRAM BILATERAL BREAST   Urinary frequency    Acute on chronic, will complete UA      Relevant Orders   Urinalysis, Routine w reflex microscopic   Weight gain    Stable since last OV; however, up 20# from last Fall Continue  to recommend balanced, lower carb meals. Smaller meal size, adding snacks. Choosing water as drink of choice and increasing purposeful exercise. Check thyroid labs      Relevant Orders   Comprehensive Metabolic Panel (CMET)   CBC with Differential/Platelet   TSH + free T4   Return in about 6 months (around 12/20/2022) for chonic disease management.    Vonna Kotyk, FNP, have reviewed all documentation for this visit. The documentation on 06/20/22 for the exam, diagnosis, procedures, and orders are all accurate and complete.  Gwyneth Sprout, [place] [phone] (phone) [phone] (fax)  [place]

## 2022-06-20 ENCOUNTER — Telehealth: Payer: Self-pay

## 2022-06-20 ENCOUNTER — Other Ambulatory Visit: Payer: Self-pay

## 2022-06-20 ENCOUNTER — Ambulatory Visit (INDEPENDENT_AMBULATORY_CARE_PROVIDER_SITE_OTHER): Payer: 59 | Admitting: Family Medicine

## 2022-06-20 ENCOUNTER — Telehealth: Payer: Self-pay | Admitting: Family Medicine

## 2022-06-20 ENCOUNTER — Encounter: Payer: Self-pay | Admitting: Family Medicine

## 2022-06-20 VITALS — BP 129/63 | HR 68 | Temp 98.6°F | Wt 243.0 lb

## 2022-06-20 DIAGNOSIS — I10 Essential (primary) hypertension: Secondary | ICD-10-CM | POA: Diagnosis not present

## 2022-06-20 DIAGNOSIS — F4323 Adjustment disorder with mixed anxiety and depressed mood: Secondary | ICD-10-CM | POA: Diagnosis not present

## 2022-06-20 DIAGNOSIS — E782 Mixed hyperlipidemia: Secondary | ICD-10-CM | POA: Diagnosis not present

## 2022-06-20 DIAGNOSIS — G8929 Other chronic pain: Secondary | ICD-10-CM | POA: Insufficient documentation

## 2022-06-20 DIAGNOSIS — Z1231 Encounter for screening mammogram for malignant neoplasm of breast: Secondary | ICD-10-CM | POA: Insufficient documentation

## 2022-06-20 DIAGNOSIS — E669 Obesity, unspecified: Secondary | ICD-10-CM | POA: Diagnosis not present

## 2022-06-20 DIAGNOSIS — F1190 Opioid use, unspecified, uncomplicated: Secondary | ICD-10-CM

## 2022-06-20 DIAGNOSIS — Z5320 Procedure and treatment not carried out because of patient's decision for unspecified reasons: Secondary | ICD-10-CM | POA: Insufficient documentation

## 2022-06-20 DIAGNOSIS — E559 Vitamin D deficiency, unspecified: Secondary | ICD-10-CM

## 2022-06-20 DIAGNOSIS — Z1211 Encounter for screening for malignant neoplasm of colon: Secondary | ICD-10-CM

## 2022-06-20 DIAGNOSIS — D509 Iron deficiency anemia, unspecified: Secondary | ICD-10-CM | POA: Insufficient documentation

## 2022-06-20 DIAGNOSIS — M170 Bilateral primary osteoarthritis of knee: Secondary | ICD-10-CM | POA: Diagnosis not present

## 2022-06-20 DIAGNOSIS — Z0000 Encounter for general adult medical examination without abnormal findings: Secondary | ICD-10-CM | POA: Diagnosis not present

## 2022-06-20 DIAGNOSIS — Z79899 Other long term (current) drug therapy: Secondary | ICD-10-CM

## 2022-06-20 DIAGNOSIS — R739 Hyperglycemia, unspecified: Secondary | ICD-10-CM | POA: Insufficient documentation

## 2022-06-20 DIAGNOSIS — F419 Anxiety disorder, unspecified: Secondary | ICD-10-CM

## 2022-06-20 DIAGNOSIS — R350 Frequency of micturition: Secondary | ICD-10-CM | POA: Insufficient documentation

## 2022-06-20 DIAGNOSIS — M545 Low back pain, unspecified: Secondary | ICD-10-CM

## 2022-06-20 DIAGNOSIS — R635 Abnormal weight gain: Secondary | ICD-10-CM | POA: Insufficient documentation

## 2022-06-20 MED ORDER — OXYCODONE-ACETAMINOPHEN 7.5-325 MG PO TABS
1.0000 | ORAL_TABLET | Freq: Three times a day (TID) | ORAL | 0 refills | Status: DC | PRN
Start: 2022-06-20 — End: 2022-07-23

## 2022-06-20 MED ORDER — NA SULFATE-K SULFATE-MG SULF 17.5-3.13-1.6 GM/177ML PO SOLN: 1.0000 | Freq: Once | ORAL | 0 refills | Status: AC

## 2022-06-20 MED ORDER — ALPRAZOLAM 0.5 MG PO TABS: 0.5000 mg | ORAL_TABLET | Freq: Every day | ORAL | 0 refills | Status: DC | PRN

## 2022-06-20 NOTE — Assessment & Plan Note (Signed)
Chronic, stable Remains on opioids to assist adjunct treatment

## 2022-06-20 NOTE — Assessment & Plan Note (Signed)
Chronic, endorses fatigue; also under undue stress given relationship concerns Denies concern for DA/DV Repeat CBC

## 2022-06-20 NOTE — Assessment & Plan Note (Addendum)
UDS 11/2021 Previously on 3# perc 7.5 mg daily PRN  Pt is aware of risks of opioid medication use to include increased sedation, respiratory suppression, falls, dependence and cardiovascular events. Pt would like to continue treatment as benefit determined to outweigh risk.

## 2022-06-20 NOTE — Assessment & Plan Note (Signed)
Chronic, previously on supplementation Repeat Vit D labs

## 2022-06-20 NOTE — Assessment & Plan Note (Signed)
Chronic, worsening Morbid now with Body mass index is 35.37 kg/m. Associated with depression, anxiety, HTN, HLD

## 2022-06-20 NOTE — Patient Instructions (Addendum)
The CDC recommends two doses of Shingrix (the new shingles vaccine) separated by 2 to 6 months for adults age 63 years and older. I recommend checking with your insurance plan regarding coverage for this vaccine.    Please call and schedule your mammogram:  [facility]  [address], [address] [facility] [place],  [place]  [address] Get Driving Directions Main: [phone]  Sunday:Closed Monday:7:20 AM - 5:00 PM Tuesday:7:20 AM - 5:00 PM Wednesday:7:20 AM - 5:00 PM Thursday:7:20 AM - 5:00 PM Friday:7:20 AM - 4:30 PM Saturday:Closed  

## 2022-06-20 NOTE — Assessment & Plan Note (Signed)
Acute, stable Denies SI or HI At max dose of celexa 40 mg and using xanax daily PRN at 0.5 mg

## 2022-06-20 NOTE — Assessment & Plan Note (Signed)
Chronic, previously elevated Continue crestor 10 mg Repeat LP recommend diet low in saturated fat and regular exercise - 30 min at least 5 times per week

## 2022-06-20 NOTE — Assessment & Plan Note (Signed)
Repeat A1c; Continue to recommend balanced, lower carb meals. Smaller meal size, adding snacks. Choosing water as drink of choice and increasing purposeful exercise. ° °

## 2022-06-20 NOTE — Assessment & Plan Note (Signed)
Chronic, stable Goal <139/<79 Continue lisinopril 10 mg

## 2022-06-20 NOTE — Assessment & Plan Note (Signed)
Due for mammo, colon, PAP [deferred to GYN per pt request] Things to do to keep yourself healthy  - Exercise at least 30-45 minutes a day, 3-4 days a week.  - Eat a low-fat diet with lots of fruits and vegetables, up to 7-9 servings per day.  - Seatbelts can save your life. Wear them always.  - Smoke detectors on every level of your home, check batteries every year.  - Eye Doctor - have an eye exam every 1-2 years  - Safe sex - if you may be exposed to STDs, use a condom.  - Alcohol -  If you drink, do it moderately, less than 2 drinks per day.  - [place]. Choose someone to speak for you if you are not able.  - Depression is common in our stressful world.If you're feeling down or losing interest in things you normally enjoy, please come in for a visit.  - Violence - If anyone is threatening or hurting you, please call immediately. Continue dental and vision screenings as well

## 2022-06-20 NOTE — Assessment & Plan Note (Addendum)
Last UDS 11/2021 Previously on xanax 0.5 mg daily to assist  Pt is aware of risks of psychoactive medication use to include increased sedation, respiratory suppression, falls, extrapyramidal movements,  dependence and cardiovascular events.  Pt would like to continue treatment as benefit determined to outweigh risk.

## 2022-06-20 NOTE — Telephone Encounter (Signed)
Pt states that she was seen in the office today and was told that her medication would be sent to the pharmacy today for her to pick up. When pt went to pick up her medication the pharmacist told her that she could not pick up the medication until Saturday. Pt states that she ran short on her medication due to the pain she is in and is wanting to see if PCP can call the pharmacy so that she can get her medication today. Pt would like a call back.   oxyCODONE-acetaminophen (PERCOCET) 7.5-325 MG tablet     [facility] - [place], [place]. Phone: [phone]  Fax: [phone]

## 2022-06-20 NOTE — Assessment & Plan Note (Signed)
Stable since last OV; however, up 20# from last Fall Continue to recommend balanced, lower carb meals. Smaller meal size, adding snacks. Choosing water as drink of choice and increasing purposeful exercise. Check thyroid labs

## 2022-06-20 NOTE — Assessment & Plan Note (Signed)
Due for colon cancer screening; referral placed

## 2022-06-20 NOTE — Assessment & Plan Note (Signed)
Due for screening for mammogram, denies breast concerns, provided with phone number to call and schedule appointment for mammogram. Encouraged to repeat breast cancer screening every 1-2 years.  

## 2022-06-20 NOTE — Assessment & Plan Note (Signed)
Acute on chronic, will complete UA

## 2022-06-20 NOTE — Assessment & Plan Note (Signed)
Wishes to f/u with GYN

## 2022-06-20 NOTE — Telephone Encounter (Signed)
Gastroenterology Pre-Procedure Review  Request Date: 08/02/22 Requesting Physician: Dr. Vicente Males  PATIENT REVIEW QUESTIONS: The patient responded to the following health history questions as indicated:    1. Are you having any GI issues? no 2. Do you have a personal history of Polyps? no 3. Do you have a family history of Colon Cancer or Polyps? no 4. Diabetes Mellitus? no 5. Joint replacements in the past 12 months?no 6. Major health problems in the past 3 months?no 7. Any artificial heart valves, MVP, or defibrillator?no    MEDICATIONS & ALLERGIES:    Patient reports the following regarding taking any anticoagulation/antiplatelet therapy:   Plavix, Coumadin, Eliquis, Xarelto, Lovenox, Pradaxa, Brilinta, or Effient? no Aspirin? no  Patient confirms/reports the following medications:  Current Outpatient Medications  Medication Sig Dispense Refill   ALPRAZolam (XANAX) 0.5 MG tablet Take 1 tablet (0.5 mg total) by mouth daily as needed. 30 tablet 0   aspirin EC 81 MG tablet Take 81 mg by mouth daily. Swallow whole.     citalopram (CELEXA) 40 MG tablet Take 1 tablet by mouth daily.     fluticasone (FLONASE) 50 MCG/ACT nasal spray Place 2 sprays into both nostrils daily. (Patient taking differently: Place 2 sprays into both nostrils as needed.) 48 g 1   lisinopril (ZESTRIL) 10 MG tablet Take 1 tablet (10 mg total) by mouth daily. 90 tablet 1   nicotine (NICODERM CQ - DOSED IN MG/24 HOURS) 14 mg/24hr patch Place 1 patch (14 mg total) onto the skin daily. 28 patch 0   ondansetron (ZOFRAN) 4 MG tablet Take 1 tablet (4 mg total) by mouth daily as needed for nausea or vomiting. 30 tablet 1   oxyCODONE-acetaminophen (PERCOCET) 7.5-325 MG tablet Take 1 tablet by mouth every 8 (eight) hours as needed for severe pain. 90 tablet 0   rosuvastatin (CRESTOR) 10 MG tablet Take 1 tablet (10 mg total) by mouth daily. 90 tablet 1   SUMAtriptan (IMITREX) 50 MG tablet Take 1 tablet by mouth as needed for  migraine. May repeat in 2 hours if headache persists or recurs. Max 2 tablets per day. 180 tablet 0   Vitamin D, Ergocalciferol, (DRISDOL) 1.25 MG (50000 UNIT) CAPS capsule Take 1 capsule (50,000 Units total) by mouth every 7 (seven) days. 13 capsule 0   No current facility-administered medications for this visit.    Patient confirms/reports the following allergies:  Allergies  Allergen Reactions   Furosemide Rash    No orders of the defined types were placed in this encounter.   AUTHORIZATION INFORMATION Primary Insurance: 1D#: Group #:  Secondary Insurance: 1D#: Group #:  SCHEDULE INFORMATION: Date: 08/02/22 Time: Location: [facility]

## 2022-06-21 ENCOUNTER — Other Ambulatory Visit: Payer: Self-pay

## 2022-06-21 ENCOUNTER — Other Ambulatory Visit: Payer: Self-pay | Admitting: Family Medicine

## 2022-06-21 DIAGNOSIS — I10 Essential (primary) hypertension: Secondary | ICD-10-CM

## 2022-06-21 DIAGNOSIS — E7800 Pure hypercholesterolemia, unspecified: Secondary | ICD-10-CM

## 2022-06-21 DIAGNOSIS — E559 Vitamin D deficiency, unspecified: Secondary | ICD-10-CM

## 2022-06-21 LAB — COMPREHENSIVE METABOLIC PANEL
ALT: 14 IU/L (ref 0–32)
AST: 17 IU/L (ref 0–40)
Albumin/Globulin Ratio: 1.3 (ref 1.2–2.2)
Albumin: 4.2 g/dL (ref 3.9–4.9)
Alkaline Phosphatase: 83 IU/L (ref 44–121)
BUN/Creatinine Ratio: 18 (ref 12–28)
BUN: 19 mg/dL (ref 8–27)
Bilirubin Total: 0.2 mg/dL (ref 0.0–1.2)
CO2: 23 mmol/L (ref 20–29)
Calcium: 9.3 mg/dL (ref 8.7–10.3)
Chloride: 102 mmol/L (ref 96–106)
Creatinine, Ser: 1.07 mg/dL — ABNORMAL HIGH (ref 0.57–1.00)
Globulin, Total: 3.2 g/dL (ref 1.5–4.5)
Glucose: 115 mg/dL — ABNORMAL HIGH (ref 70–99)
Potassium: 4.7 mmol/L (ref 3.5–5.2)
Sodium: 141 mmol/L (ref 134–144)
Total Protein: 7.4 g/dL (ref 6.0–8.5)
eGFR: 59 mL/min/{1.73_m2} — ABNORMAL LOW (ref >59)

## 2022-06-21 LAB — HEMOGLOBIN A1C
Est. average glucose Bld gHb Est-mCnc: 117 mg/dL
Hgb A1c MFr Bld: 5.7 % — ABNORMAL HIGH (ref 4.8–5.6)

## 2022-06-21 LAB — CBC WITH DIFFERENTIAL/PLATELET
Basophils Absolute: 0.2 10*3/uL (ref 0.0–0.2)
Basos: 2 %
EOS (ABSOLUTE): 0.3 10*3/uL (ref 0.0–0.4)
Eos: 4 %
Hematocrit: 33.3 % — ABNORMAL LOW (ref 34.0–46.6)
Hemoglobin: 10.7 g/dL — ABNORMAL LOW (ref 11.1–15.9)
Immature Grans (Abs): 0 10*3/uL (ref 0.0–0.1)
Immature Granulocytes: 0 %
Lymphocytes Absolute: 3.3 10*3/uL — ABNORMAL HIGH (ref 0.7–3.1)
Lymphs: 43 %
MCH: 28.4 pg (ref 26.6–33.0)
MCHC: 32.1 g/dL (ref 31.5–35.7)
MCV: 88 fL (ref 79–97)
Monocytes Absolute: 0.6 10*3/uL (ref 0.1–0.9)
Monocytes: 8 %
Neutrophils Absolute: 3.3 10*3/uL (ref 1.4–7.0)
Neutrophils: 43 %
Platelets: 288 10*3/uL (ref 150–450)
RBC: 3.77 x10E6/uL (ref 3.77–5.28)
RDW: 12.7 % (ref 11.7–15.4)
WBC: 7.7 10*3/uL (ref 3.4–10.8)

## 2022-06-21 LAB — LIPID PANEL
Chol/HDL Ratio: 3.2 ratio (ref 0.0–4.4)
Cholesterol, Total: 180 mg/dL (ref 100–199)
HDL: 57 mg/dL (ref >39)
LDL Chol Calc (NIH): 99 mg/dL (ref 0–99)
Triglycerides: 134 mg/dL (ref 0–149)
VLDL Cholesterol Cal: 24 mg/dL (ref 5–40)

## 2022-06-21 LAB — URINALYSIS, ROUTINE W REFLEX MICROSCOPIC
Bilirubin, UA: NEGATIVE
Glucose, UA: NEGATIVE
Leukocytes,UA: NEGATIVE
Nitrite, UA: NEGATIVE
RBC, UA: NEGATIVE
Specific Gravity, UA: >=1.03 — AB (ref 1.005–1.030)
Urobilinogen, Ur: 0.2 mg/dL (ref 0.2–1.0)
pH, UA: 5.5 (ref 5.0–7.5)

## 2022-06-21 LAB — TSH+FREE T4
Free T4: 1.48 ng/dL (ref 0.82–1.77)
TSH: 1.34 u[IU]/mL (ref 0.450–4.500)

## 2022-06-21 LAB — VITAMIN D 25 HYDROXY (VIT D DEFICIENCY, FRACTURES): Vit D, 25-Hydroxy: 26 ng/mL — ABNORMAL LOW (ref 30.0–100.0)

## 2022-06-21 MED ORDER — LISINOPRIL 10 MG PO TABS: 10.0000 mg | ORAL_TABLET | Freq: Every day | ORAL | 3 refills | Status: DC

## 2022-06-21 MED ORDER — VITAMIN D (ERGOCALCIFEROL) 1.25 MG (50000 UNIT) PO CAPS: 50000.0000 [IU] | ORAL_CAPSULE | ORAL | 3 refills | Status: DC

## 2022-06-21 MED ORDER — ROSUVASTATIN CALCIUM 10 MG PO TABS: 10.0000 mg | ORAL_TABLET | Freq: Every day | ORAL | 3 refills | Status: DC

## 2022-06-21 NOTE — Telephone Encounter (Signed)
Mailbox full unable to leave voicemail. CRM created. Ok for [facility] to advise

## 2022-06-21 NOTE — Progress Notes (Signed)
A1c confirms pre-diabetes. Continue to recommend balanced, lower carb meals. Smaller meal size, adding snacks. Choosing water as drink of choice and increasing purposeful exercise. Vit D remains low; continue supplement.  Creatinine is increased from 6 months ago with decrease in kidney function. Prioritize water at 48 oz/day. Repeat labs in 3 months. Anemia remains; however, improved. Urine analysis shows dehydration and ketones.  Cholesterol is now at goal; continue medication. I continue to recommend diet low in saturated fat and regular exercise - 30 min at least 5 times per week Normal thyroid

## 2022-06-24 ENCOUNTER — Other Ambulatory Visit: Payer: Self-pay

## 2022-06-25 LAB — DRUG SCREEN 12+ALCOHOL+CRT, UR
Amphetamines, Urine: NEGATIVE ng/mL
BENZODIAZ UR QL: NEGATIVE ng/mL
Barbiturate: NEGATIVE ng/mL
Cannabinoids: NEGATIVE ng/mL
Cocaine (Metabolite): NEGATIVE ng/mL
Creatinine, Urine: 161.6 mg/dL (ref 20.0–300.0)
Ethanol, Urine: NEGATIVE %
Meperidine: NEGATIVE ng/mL
Methadone: NEGATIVE ng/mL
OPIATE SCREEN URINE: NEGATIVE ng/mL
Oxycodone/Oxymorphone, Urine: POSITIVE — AB
Phencyclidine: NEGATIVE ng/mL
Propoxyphene: NEGATIVE ng/mL
Tramadol: NEGATIVE ng/mL

## 2022-06-27 ENCOUNTER — Other Ambulatory Visit: Payer: Self-pay

## 2022-06-28 ENCOUNTER — Ambulatory Visit (INDEPENDENT_AMBULATORY_CARE_PROVIDER_SITE_OTHER): Payer: 59 | Admitting: Family Medicine

## 2022-06-28 DIAGNOSIS — Z23 Encounter for immunization: Secondary | ICD-10-CM

## 2022-06-28 DIAGNOSIS — Z91199 Patient's noncompliance with other medical treatment and regimen due to unspecified reason: Secondary | ICD-10-CM

## 2022-06-28 NOTE — Progress Notes (Signed)
No call/no show for vaccine

## 2022-06-28 NOTE — Telephone Encounter (Signed)
Patient did not return call.  Per dispense report she has picked up her medication on 06/22/22

## 2022-07-20 ENCOUNTER — Other Ambulatory Visit: Payer: Self-pay | Admitting: Family Medicine

## 2022-07-20 DIAGNOSIS — F419 Anxiety disorder, unspecified: Secondary | ICD-10-CM

## 2022-07-20 DIAGNOSIS — F1190 Opioid use, unspecified, uncomplicated: Secondary | ICD-10-CM

## 2022-07-20 DIAGNOSIS — M170 Bilateral primary osteoarthritis of knee: Secondary | ICD-10-CM

## 2022-07-20 DIAGNOSIS — Z79899 Other long term (current) drug therapy: Secondary | ICD-10-CM

## 2022-07-23 ENCOUNTER — Encounter: Payer: Self-pay | Admitting: Family Medicine

## 2022-07-23 ENCOUNTER — Other Ambulatory Visit: Payer: Self-pay | Admitting: Family Medicine

## 2022-07-23 ENCOUNTER — Telehealth: Payer: Self-pay

## 2022-07-23 DIAGNOSIS — F419 Anxiety disorder, unspecified: Secondary | ICD-10-CM

## 2022-07-23 DIAGNOSIS — F1190 Opioid use, unspecified, uncomplicated: Secondary | ICD-10-CM

## 2022-07-23 DIAGNOSIS — Z79899 Other long term (current) drug therapy: Secondary | ICD-10-CM

## 2022-07-23 DIAGNOSIS — M170 Bilateral primary osteoarthritis of knee: Secondary | ICD-10-CM

## 2022-07-23 MED ORDER — ALPRAZOLAM 0.5 MG PO TABS: 0.5000 mg | ORAL_TABLET | Freq: Every day | ORAL | 0 refills | Status: DC | PRN

## 2022-07-23 MED ORDER — OXYCODONE-ACETAMINOPHEN 7.5-325 MG PO TABS
1.0000 | ORAL_TABLET | Freq: Three times a day (TID) | ORAL | 0 refills | Status: DC | PRN
Start: 2022-07-23 — End: 2022-08-19

## 2022-07-23 NOTE — Telephone Encounter (Signed)
Copied from CRM [id number]. Topic: General - Other >> Jul 23, 2022  2:30 PM Everette C wrote: Reason for CRM: The patient would like to speak with a member of clinical staff when possible to follow up on their request for Medication Refill - Medication: oxyCODONE-acetaminophen (PERCOCET) 7.5-325 MG tablet   Please contact further when possible

## 2022-07-23 NOTE — Telephone Encounter (Signed)
Medication Refill - Medication: oxyCODONE-acetaminophen (PERCOCET) 7.5-325 MG tablet    Has the patient contacted their pharmacy? No. Pt sent a refill request through mychart on 07/20/22. I did explain 3 business days for refill. Pt states that she is out of medication and is really needing her medication and requested refill request be sent back again.   Preferred Pharmacy (with phone number or street name):  [facility] [place], [place] - [address] Phone: [phone]  Fax: [phone]     Has the patient been seen for an appointment in the last year OR does the patient have an upcoming appointment? Yes.    Agent: Please be advised that RX refills may take up to 3 business days. We ask that you follow-up with your pharmacy.

## 2022-07-23 NOTE — Telephone Encounter (Signed)
Requested medication (s) are due for refill today: yes  Requested medication (s) are on the active medication list: yes    Last refill: 06/20/22   #90  0 refills  Future visit scheduled no  Notes to clinic:Not delegated, please review. Thank you.  Requested Prescriptions  Pending Prescriptions Disp Refills   oxyCODONE-acetaminophen (PERCOCET) 7.5-325 MG tablet 90 tablet 0    Sig: Take 1 tablet by mouth every 8 (eight) hours as needed for severe pain.     Not Delegated - Analgesics:  Opioid Agonist Combinations Failed - 07/23/2022 12:05 PM      Failed - This refill cannot be delegated      Passed - Urine Drug Screen completed in last 360 days      Passed - Valid encounter within last 3 months    Recent Outpatient Visits           1 month ago Annual physical exam   [facility] Jacky Kindle, FNP   5 months ago Anxiety   [place] [facility] Merita Norton T, FNP   6 months ago Community acquired pneumonia of left lower lobe of lung   [facility] Tina, Marzella Schlein, MD   7 months ago Snoring   [facility] Caro Laroche, DO   1 year ago Chronic, continuous use of opioids   [facility] Jacky Kindle, [place]

## 2022-07-23 NOTE — Telephone Encounter (Signed)
Requested medication (s) are due for refill today: yes  Requested medication (s) are on the active medication list: yes    Last refill: 06/20/22   #30  0 refills  Future visit scheduled no  Notes to clinic:Not delegated, please review. Thank you.  Requested Prescriptions  Pending Prescriptions Disp Refills   ALPRAZolam (XANAX) 0.5 MG tablet [Pharmacy Med Name: ALPRAZOLAM 0.5 MG TAB] 30 tablet     Sig: TAKE 1 TABLET BY MOUTH ONCE DAILY AS NEEDED     Not Delegated - Psychiatry: Anxiolytics/Hypnotics 2 Failed - 07/23/2022  5:19 PM      Failed - This refill cannot be delegated      Passed - Urine Drug Screen completed in last 360 days      Passed - Patient is not pregnant      Passed - Valid encounter within last 6 months    Recent Outpatient Visits           1 month ago Annual physical exam   [place] [facility] Jacky Kindle, FNP   5 months ago Anxiety   [facility] Merita Norton T, FNP   6 months ago Community acquired pneumonia of left lower lobe of lung   [facility] [place], Marzella Schlein, MD   7 months ago Snoring   [facility] Caro Laroche, DO   1 year ago Chronic, continuous use of opioids   [facility] Jacky Kindle, [place]

## 2022-08-02 ENCOUNTER — Encounter
Admission: RE | Disposition: A | Discharge status: Home / Self Care | Payer: Self-pay | Source: Home / Self Care | Attending: Gastroenterology

## 2022-08-02 ENCOUNTER — Ambulatory Visit
Admission: RE | Admit: 2022-08-02 | Discharge: 2022-08-02 | Disposition: A | Discharge status: Home / Self Care | Payer: 59 | Attending: Gastroenterology | Admitting: Gastroenterology

## 2022-08-02 ENCOUNTER — Ambulatory Visit: Payer: 59 | Admitting: Certified Registered Nurse Anesthetist

## 2022-08-02 ENCOUNTER — Other Ambulatory Visit: Payer: Self-pay

## 2022-08-02 ENCOUNTER — Encounter: Payer: Self-pay | Admitting: Gastroenterology

## 2022-08-02 DIAGNOSIS — Z1211 Encounter for screening for malignant neoplasm of colon: Secondary | ICD-10-CM | POA: Insufficient documentation

## 2022-08-02 DIAGNOSIS — Z87891 Personal history of nicotine dependence: Secondary | ICD-10-CM | POA: Diagnosis not present

## 2022-08-02 DIAGNOSIS — K5730 Diverticulosis of large intestine without perforation or abscess without bleeding: Secondary | ICD-10-CM | POA: Insufficient documentation

## 2022-08-02 DIAGNOSIS — F419 Anxiety disorder, unspecified: Secondary | ICD-10-CM | POA: Diagnosis not present

## 2022-08-02 DIAGNOSIS — I10 Essential (primary) hypertension: Secondary | ICD-10-CM | POA: Diagnosis not present

## 2022-08-02 HISTORY — PX: COLONOSCOPY WITH PROPOFOL: SHX5780

## 2022-08-02 SURGERY — COLONOSCOPY WITH PROPOFOL
Anesthesia: General

## 2022-08-02 MED ORDER — PROPOFOL 10 MG/ML IV BOLUS
INTRAVENOUS | Status: DC | PRN
  Administered 2022-08-02: 60 mg via INTRAVENOUS

## 2022-08-02 MED ORDER — ONDANSETRON HCL 4 MG/2ML IJ SOLN
4.0000 mg | Freq: Once | INTRAMUSCULAR | Status: AC
  Administered 2022-08-02: 4 mg via INTRAVENOUS

## 2022-08-02 MED ORDER — PROPOFOL 500 MG/50ML IV EMUL
INTRAVENOUS | Status: DC | PRN
  Administered 2022-08-02: 150 ug/kg/min via INTRAVENOUS

## 2022-08-02 MED ORDER — LIDOCAINE HCL (CARDIAC) PF 100 MG/5ML IV SOSY
PREFILLED_SYRINGE | INTRAVENOUS | Status: DC | PRN
  Administered 2022-08-02: 50 mg via INTRAVENOUS

## 2022-08-02 MED ORDER — SODIUM CHLORIDE 0.9 % IV SOLN: INTRAVENOUS | Status: DC

## 2022-08-02 MED ORDER — ONDANSETRON HCL 4 MG/2ML IJ SOLN
INTRAMUSCULAR | Status: AC
  Filled 2022-08-02: qty 2

## 2022-08-02 MED ORDER — PROPOFOL 1000 MG/100ML IV EMUL
INTRAVENOUS | Status: AC
  Filled 2022-08-02: qty 100

## 2022-08-02 MED ORDER — LIDOCAINE HCL (PF) 2 % IJ SOLN
INTRAMUSCULAR | Status: AC
  Filled 2022-08-02: qty 5

## 2022-08-02 NOTE — Anesthesia Postprocedure Evaluation (Signed)
Anesthesia Post Note  Patient: Theatre stage manager  Procedure(s) Performed: COLONOSCOPY WITH PROPOFOL  Patient location during evaluation: Endoscopy Anesthesia Type: General Level of consciousness: awake and alert Pain management: pain level controlled Vital Signs Assessment: post-procedure vital signs reviewed and stable Respiratory status: spontaneous breathing, nonlabored ventilation, respiratory function stable and patient connected to nasal cannula oxygen Cardiovascular status: blood pressure returned to baseline and stable Postop Assessment: no apparent nausea or vomiting Anesthetic complications: no   No notable events documented.   Last Vitals:  Vitals:   08/02/22 0841 08/02/22 0901  BP: 117/60   Pulse: 63   Resp: 16   Temp:  (!) 35.6 C  SpO2: 98%     Last Pain:  Vitals:   08/02/22 0901  TempSrc: Temporal  PainSc: 0-No pain                 Lenard Simmer

## 2022-08-02 NOTE — Anesthesia Procedure Notes (Signed)
Procedure Name: MAC Date/Time: 08/02/2022 8:17 AM  Performed by: Hezzie Bump, CRNAPre-anesthesia Checklist: Patient identified, Emergency Drugs available, Suction available and Patient being monitored Patient Re-evaluated:Patient Re-evaluated prior to induction Oxygen Delivery Method: Nasal cannula Induction Type: IV induction Placement Confirmation: positive ETCO2

## 2022-08-02 NOTE — Op Note (Signed)
[facility] Gastroenterology Patient Name: Kristin Aguirre Procedure Date: 08/02/2022 8:12 AM MRN: [id number] Account #: [id number] Date of Birth: [birth year] Admit Type: Outpatient Age: 62 Room: [facility] ENDO ROOM 4 Gender: Female Note Status: Finalized Instrument Name: Prentice Docker [id number] Procedure:             Colonoscopy Indications:           Screening for colorectal malignant neoplasm Providers:             Wyline Mood MD, MD Referring MD:          Wyline Mood MD, MD (Referring MD), Daryl Eastern. Suzie Portela                         (Referring MD) Medicines:             Monitored Anesthesia Care Complications:         No immediate complications. Procedure:             Pre-Anesthesia Assessment:                        - Prior to the procedure, a History and Physical was                         performed, and patient medications, allergies and                         sensitivities were reviewed. The patient's tolerance                         of previous anesthesia was reviewed.                        - The risks and benefits of the procedure and the                         sedation options and risks were discussed with the                         patient. All questions were answered and informed                         consent was obtained.                        - ASA Grade Assessment: II - A patient with mild                         systemic disease.                        After obtaining informed consent, the colonoscope was                         passed under direct vision. Throughout the procedure,                         the patient's blood pressure, pulse, and oxygen                         saturations were  monitored continuously. The                         Colonoscope was introduced through the anus and                         advanced to the the cecum, identified by the                         appendiceal orifice. The colonoscopy was performed                          with ease. The patient tolerated the procedure well.                         The quality of the bowel preparation was excellent.                         The ileocecal valve, appendiceal orifice, and rectum                         were photographed. Findings:      The perianal and digital rectal examinations were normal.      The entire examined colon appeared normal on direct and retroflexion       views.      A few small-mouthed diverticula were found in the sigmoid colon. Impression:            - The entire examined colon is normal on direct and                         retroflexion views.                        - No specimens collected. Recommendation:        - Discharge patient to home (with escort).                        - Resume previous diet.                        - Continue present medications.                        - Repeat colonoscopy in 10 years for screening                         purposes. Procedure Code(s):     --- Professional ---                        [phone], Colonoscopy, flexible; diagnostic, including                         collection of specimen(s) by brushing or washing, when                         performed (separate procedure) Diagnosis Code(s):     --- Professional ---                        Z12.11, Encounter for  screening for malignant neoplasm                         of colon CPT copyright 2022 American Medical Association. All rights reserved. The codes documented in this report are preliminary and upon coder review may  be revised to meet current compliance requirements. Wyline Mood, MD Wyline Mood MD, MD 08/02/2022 8:40:52 AM This report has been signed electronically. Number of Addenda: 0 Note Initiated On: 08/02/2022 8:12 AM Scope Withdrawal Time: 0 hours 9 minutes 24 seconds  Total Procedure Duration: 0 hours 14 minutes 48 seconds  Estimated Blood Loss:  Estimated blood loss: none.      [facility]

## 2022-08-02 NOTE — H&P (Signed)
Wyline Mood, MD [address], [address], [place], [place], [address] [address], [place], [place], [address] Phone: [phone]  Fax: [phone]  Primary Care Physician:  Jacky Kindle, FNP   Pre-Procedure History & Physical: HPI:  Kristin Aguirre is a 62 y.o. female is here for an colonoscopy.   Past Medical History:  Diagnosis Date   Acute pyelonephritis 08/13/2021   Allergy    Anxiety    Colon cancer screening 10/20/2020   COVID-19 12/29/2020   Diffuse cystic mastopathy    LEFT SIDE   GERD (gastroesophageal reflux disease)    High cholesterol    PONV (postoperative nausea and vomiting)    Sepsis (HCC) 08/13/2021    Past Surgical History:  Procedure Laterality Date   ABDOMINAL HYSTERECTOMY  2003   BREAST BIOPSY Left 2011, 2015   neg   BREAST BIOPSY Left 07/17/2009   Vacuum biopsy, 4:00 position: 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   cervical spine repair      CESAREAN SECTION     TONSILLECTOMY      Prior to Admission medications   Medication Sig Start Date End Date Taking? Authorizing Provider  ALPRAZolam Prudy Feeler) 0.5 MG tablet Take 1 tablet (0.5 mg total) by mouth daily as needed. 07/23/22   Jacky Kindle, FNP  aspirin EC 81 MG tablet Take 81 mg by mouth daily. Swallow whole.    [provider]  citalopram (CELEXA) 40 MG tablet Take 1 tablet by mouth daily.    [provider]  fluticasone (FLONASE) 50 MCG/ACT nasal spray Place 2 sprays into both nostrils daily. Patient taking differently: Place 2 sprays into both nostrils as needed. 10/20/20   Jacky Kindle, FNP  lisinopril (ZESTRIL) 10 MG tablet Take 1 tablet (10 mg total) by mouth daily. 06/21/22   Jacky Kindle, FNP  nicotine (NICODERM CQ - DOSED IN MG/24 HOURS) 14 mg/24hr patch Place 1 patch (14 mg total) onto the skin daily. 12/18/21   Danford, Earl Lites, MD  ondansetron (ZOFRAN) 4 MG  tablet Take 1 tablet (4 mg total) by mouth daily as needed for nausea or vomiting. 05/06/22 05/06/23  Jacky Kindle, FNP  oxyCODONE-acetaminophen (PERCOCET) 7.5-325 MG tablet Take 1 tablet by mouth every 8 (eight) hours as needed for severe pain. 07/23/22 07/23/23  Jacky Kindle, FNP  rosuvastatin (CRESTOR) 10 MG tablet Take 1 tablet (10 mg total) by mouth daily. 06/21/22   Jacky Kindle, FNP  SUMAtriptan (IMITREX) 50 MG tablet Take 1 tablet by mouth as needed for migraine. May repeat in 2 hours if headache persists or recurs. Max 2 tablets per day. 03/21/22   Jacky Kindle, FNP  Vitamin D, Ergocalciferol, (DRISDOL) 1.25 MG (50000 UNIT) CAPS capsule Take 1 capsule (50,000 Units total) by mouth every 7 (seven) days. 06/21/22   Jacky Kindle, FNP    Allergies as of 06/21/2022 - Review Complete 06/20/2022  Allergen Reaction Noted   Furosemide Rash 10/27/2015    Family History  Problem Relation Age of Onset   Heart block Mother    Cancer Mother        on leg   Diabetes Mother    Heart disease Mother    Diabetes Father    Heart disease Father    CVA Father    Asthma Sister    Anemia Sister    Seizures Brother    Breast cancer Maternal Grandmother  55    Social History   Socioeconomic History   Marital status: Single    Spouse name: Not on file   Number of children: Not on file   Years of education: Not on file   Highest education level: Not on file  Occupational History   Not on file  Tobacco Use   Smoking status: Former    Packs/day: .25    Types: Cigarettes    Start date: 04/03/2012    Quit date: 07/16/2021    Years since quitting: 1.0   Smokeless tobacco: Never   Tobacco comments:    SMokes 1 cigarettes daily  Vaping Use   Vaping Use: Some days  Substance and Sexual Activity   Alcohol use: Yes    Alcohol/week: 5.0 standard drinks of alcohol    Types: 5 Standard drinks or equivalent per week   Drug use: No   Sexual activity: Not on file  Other Topics Concern   Not on file   Social History Narrative   Not on file   Social Determinants of Health   Financial Resource Strain: Not on file  Food Insecurity: Not on file  Transportation Needs: Not on file  Physical Activity: Not on file  Stress: Not on file  Social Connections: Not on file  Intimate Partner Violence: Not on file    Review of Systems: See HPI, otherwise negative ROS  Physical Exam: LMP 03/18/2001 (Approximate)  General:   Alert,  pleasant and cooperative in NAD Head:  Normocephalic and atraumatic. Neck:  Supple; no masses or thyromegaly. Lungs:  Clear throughout to auscultation, normal respiratory effort.    Heart:  +S1, +S2, Regular rate and rhythm, No edema. Abdomen:  Soft, nontender and nondistended. Normal bowel sounds, without guarding, and without rebound.   Neurologic:  Alert and  oriented x4;  grossly normal neurologically.  Impression/Plan: [employer] is here for an colonoscopy to be performed for Screening colonoscopy average risk   Risks, benefits, limitations, and alternatives regarding  colonoscopy have been reviewed with the patient.  Questions have been answered.  All parties agreeable.   Wyline Mood, MD  08/02/2022, 7:44 AM

## 2022-08-02 NOTE — Transfer of Care (Signed)
Immediate Anesthesia Transfer of Care Note  Patient: Marshayla H Meegan  Procedure(s) Performed: COLONOSCOPY WITH PROPOFOL  Patient Location: PACU  Anesthesia Type:General  Level of Consciousness: drowsy  Airway & Oxygen Therapy: Patient Spontanous Breathing  Post-op Assessment: Report given to RN and Post -op Vital signs reviewed and stable  Post vital signs: Reviewed and stable  Last Vitals:  Vitals Value Taken Time  BP 117/60 08/02/22 0841  Temp    Pulse 68 08/02/22 0842  Resp 11 08/02/22 0842  SpO2 98 % 08/02/22 0842  Vitals shown include unvalidated device data.  Last Pain:  Vitals:   08/02/22 0841  TempSrc:   PainSc: 0-No pain         Complications: No notable events documented.

## 2022-08-02 NOTE — Anesthesia Preprocedure Evaluation (Signed)
Anesthesia Evaluation  Patient identified by MRN, date of birth, ID band Patient awake    Reviewed: Allergy & Precautions, H&P , NPO status , Patient's Chart, lab work & pertinent test results, reviewed documented beta blocker date and time   History of Anesthesia Complications (+) PONV and history of anesthetic complications  Airway Mallampati: III  TM Distance: >3 FB Neck ROM: full    Dental  (+) Dental Advidsory Given, Teeth Intact, Caps   Pulmonary neg shortness of breath, asthma , sleep apnea , neg recent URI, Patient abstained from smoking., former smoker   Pulmonary exam normal breath sounds clear to auscultation       Cardiovascular Exercise Tolerance: Good hypertension, (-) angina (-) Past MI and (-) Cardiac Stents Normal cardiovascular exam(-) dysrhythmias (-) Valvular Problems/Murmurs Rhythm:regular Rate:Normal     Neuro/Psych  PSYCHIATRIC DISORDERS Anxiety     negative neurological ROS     GI/Hepatic Neg liver ROS,GERD  ,,  Endo/Other  negative endocrine ROS    Renal/GU negative Renal ROS  negative genitourinary   Musculoskeletal   Abdominal   Peds  Hematology negative hematology ROS (+)   Anesthesia Other Findings Past Medical History: No date: Allergy No date: Anxiety No date: Diffuse cystic mastopathy     Comment:  LEFT SIDE No date: GERD (gastroesophageal reflux disease) No date: High cholesterol No date: PONV (postoperative nausea and vomiting)   Reproductive/Obstetrics negative OB ROS                             Anesthesia Physical Anesthesia Plan  ASA: II  Anesthesia Plan: General   Post-op Pain Management:    Induction: Intravenous  PONV Risk Score and Plan: 3 and Propofol infusion and TIVA  Airway Management Planned: Natural Airway and Nasal Cannula  Additional Equipment:   Intra-op Plan:   Post-operative Plan:   Informed Consent: I have reviewed  the patients History and Physical, chart, labs and discussed the procedure including the risks, benefits and alternatives for the proposed anesthesia with the patient or authorized representative who has indicated his/her understanding and acceptance.     Dental Advisory Given  Plan Discussed with: Anesthesiologist, CRNA and Surgeon  Anesthesia Plan Comments:         Anesthesia Quick Evaluation

## 2022-08-05 ENCOUNTER — Encounter: Payer: Self-pay | Admitting: Gastroenterology

## 2022-08-19 ENCOUNTER — Encounter: Payer: Self-pay | Admitting: Pharmacist

## 2022-08-19 ENCOUNTER — Other Ambulatory Visit: Payer: Self-pay

## 2022-08-19 ENCOUNTER — Other Ambulatory Visit (HOSPITAL_COMMUNITY): Payer: Self-pay

## 2022-08-19 ENCOUNTER — Other Ambulatory Visit: Payer: Self-pay | Admitting: Family Medicine

## 2022-08-19 DIAGNOSIS — M170 Bilateral primary osteoarthritis of knee: Secondary | ICD-10-CM

## 2022-08-19 DIAGNOSIS — F1190 Opioid use, unspecified, uncomplicated: Secondary | ICD-10-CM

## 2022-08-21 ENCOUNTER — Other Ambulatory Visit: Payer: Self-pay | Admitting: Family Medicine

## 2022-08-21 DIAGNOSIS — F419 Anxiety disorder, unspecified: Secondary | ICD-10-CM

## 2022-08-21 DIAGNOSIS — Z79899 Other long term (current) drug therapy: Secondary | ICD-10-CM

## 2022-08-21 MED ORDER — OXYCODONE-ACETAMINOPHEN 7.5-325 MG PO TABS
1.0000 | ORAL_TABLET | Freq: Three times a day (TID) | ORAL | 0 refills | Status: DC | PRN
Start: 2022-08-21 — End: 2022-09-15

## 2022-08-21 NOTE — Telephone Encounter (Signed)
Requested medication (s) are due for refill today:   Provider to review  Requested medication (s) are on the active medication list:   Yes  Future visit scheduled:   No.   Had CPE 2 mo. ago   Last ordered: 07/23/2022 #30, 0 refills  Non delegated refill    Requested Prescriptions  Pending Prescriptions Disp Refills   ALPRAZolam (XANAX) 0.5 MG tablet [Pharmacy Med Name: ALPRAZOLAM 0.5 MG TAB] 30 tablet     Sig: TAKE 1 TABLET BY MOUTH ONCE DAILY AS NEEDED     Not Delegated - Psychiatry: Anxiolytics/Hypnotics 2 Failed - 08/21/2022 10:46 AM      Failed - This refill cannot be delegated      Passed - Urine Drug Screen completed in last 360 days      Passed - Patient is not pregnant      Passed - Valid encounter within last 6 months    Recent Outpatient Visits           2 months ago Annual physical exam   [facility] Jacky Kindle, FNP   6 months ago Anxiety   [facility] Merita Norton T, FNP   7 months ago Community acquired pneumonia of left lower lobe of lung   [facility] [place], Marzella Schlein, MD   8 months ago Snoring   [facility] Caro Laroche, DO   1 year ago Chronic, continuous use of opioids   [facility] Jacky Kindle, [place]

## 2022-08-22 ENCOUNTER — Other Ambulatory Visit: Payer: Self-pay

## 2022-09-05 ENCOUNTER — Encounter: Payer: Self-pay | Admitting: Family Medicine

## 2022-09-05 ENCOUNTER — Ambulatory Visit
Admission: RE | Admit: 2022-09-05 | Discharge: 2022-09-05 | Disposition: A | Discharge status: Home / Self Care | Payer: 59 | Source: Ambulatory Visit | Attending: Family Medicine | Admitting: Family Medicine

## 2022-09-05 DIAGNOSIS — Z1231 Encounter for screening mammogram for malignant neoplasm of breast: Secondary | ICD-10-CM | POA: Insufficient documentation

## 2022-09-09 ENCOUNTER — Encounter: Payer: Self-pay | Admitting: Family Medicine

## 2022-09-09 ENCOUNTER — Other Ambulatory Visit: Payer: Self-pay | Admitting: Family Medicine

## 2022-09-09 DIAGNOSIS — R928 Other abnormal and inconclusive findings on diagnostic imaging of breast: Secondary | ICD-10-CM

## 2022-09-09 DIAGNOSIS — N6489 Other specified disorders of breast: Secondary | ICD-10-CM

## 2022-09-10 ENCOUNTER — Encounter: Payer: Self-pay | Admitting: Family Medicine

## 2022-09-12 ENCOUNTER — Other Ambulatory Visit: Payer: Self-pay | Admitting: Family Medicine

## 2022-09-12 DIAGNOSIS — R928 Other abnormal and inconclusive findings on diagnostic imaging of breast: Secondary | ICD-10-CM

## 2022-09-12 DIAGNOSIS — N6489 Other specified disorders of breast: Secondary | ICD-10-CM

## 2022-09-12 NOTE — Telephone Encounter (Signed)
Orders co-signed 

## 2022-09-15 ENCOUNTER — Other Ambulatory Visit: Payer: Self-pay | Admitting: Family Medicine

## 2022-09-15 DIAGNOSIS — F1190 Opioid use, unspecified, uncomplicated: Secondary | ICD-10-CM

## 2022-09-15 DIAGNOSIS — M170 Bilateral primary osteoarthritis of knee: Secondary | ICD-10-CM

## 2022-09-16 ENCOUNTER — Ambulatory Visit
Admission: RE | Admit: 2022-09-16 | Discharge: 2022-09-16 | Disposition: A | Discharge status: Home / Self Care | Payer: 59 | Source: Ambulatory Visit | Attending: Family Medicine | Admitting: Family Medicine

## 2022-09-16 DIAGNOSIS — R928 Other abnormal and inconclusive findings on diagnostic imaging of breast: Secondary | ICD-10-CM

## 2022-09-16 DIAGNOSIS — N6489 Other specified disorders of breast: Secondary | ICD-10-CM | POA: Insufficient documentation

## 2022-09-17 ENCOUNTER — Other Ambulatory Visit: Payer: Self-pay | Admitting: Family Medicine

## 2022-09-17 DIAGNOSIS — R928 Other abnormal and inconclusive findings on diagnostic imaging of breast: Secondary | ICD-10-CM

## 2022-09-17 DIAGNOSIS — Z79899 Other long term (current) drug therapy: Secondary | ICD-10-CM

## 2022-09-17 DIAGNOSIS — F419 Anxiety disorder, unspecified: Secondary | ICD-10-CM

## 2022-09-17 MED ORDER — OXYCODONE-ACETAMINOPHEN 7.5-325 MG PO TABS
1.0000 | ORAL_TABLET | Freq: Three times a day (TID) | ORAL | 0 refills | Status: DC | PRN
Start: 2022-09-17 — End: 2022-10-17

## 2022-09-17 NOTE — Telephone Encounter (Signed)
This was taken care of last week. By Dr. Leonard Schwartz

## 2022-09-21 ENCOUNTER — Other Ambulatory Visit: Payer: Self-pay | Admitting: Family Medicine

## 2022-09-21 DIAGNOSIS — F419 Anxiety disorder, unspecified: Secondary | ICD-10-CM

## 2022-09-21 DIAGNOSIS — Z79899 Other long term (current) drug therapy: Secondary | ICD-10-CM

## 2022-09-24 NOTE — Telephone Encounter (Signed)
Unable to refill per protocol, Rx request is too soon. Last refill 09/18/22 for 30 days.  Requested Prescriptions  Pending Prescriptions Disp Refills   ALPRAZolam (XANAX) 0.5 MG tablet 30 tablet 0    Sig: Take 1 tablet (0.5 mg total) by mouth daily as needed.     Not Delegated - Psychiatry: Anxiolytics/Hypnotics 2 Failed - 09/24/2022  3:14 PM      Failed - This refill cannot be delegated      Passed - Urine Drug Screen completed in last 360 days      Passed - Patient is not pregnant      Passed - Valid encounter within last 6 months    Recent Outpatient Visits           3 months ago Annual physical exam   [facility] Jacky Kindle, FNP   7 months ago Anxiety   [facility] Merita Norton T, FNP   9 months ago Community acquired pneumonia of left lower lobe of lung   [facility] [place], Marzella Schlein, MD   9 months ago Snoring   [facility] Caro Laroche, DO   1 year ago Chronic, continuous use of opioids   [facility] Jacky Kindle, [place]

## 2022-09-25 ENCOUNTER — Ambulatory Visit
Admission: RE | Admit: 2022-09-25 | Discharge: 2022-09-25 | Disposition: A | Discharge status: Home / Self Care | Payer: 59 | Source: Ambulatory Visit | Attending: Family Medicine | Admitting: Family Medicine

## 2022-09-25 DIAGNOSIS — R928 Other abnormal and inconclusive findings on diagnostic imaging of breast: Secondary | ICD-10-CM | POA: Insufficient documentation

## 2022-09-25 HISTORY — PX: BREAST BIOPSY: SHX20

## 2022-09-25 MED ORDER — LIDOCAINE 1 % OPTIME INJ - NO CHARGE
2.0000 mL | Freq: Once | INTRAMUSCULAR | Status: AC
  Administered 2022-09-25: 2 mL via INTRADERMAL
  Filled 2022-09-25: qty 2

## 2022-09-25 MED ORDER — LIDOCAINE-EPINEPHRINE 1 %-1:100000 IJ SOLN
5.0000 mL | Freq: Once | INTRAMUSCULAR | Status: AC
  Administered 2022-09-25: 5 mL via INTRADERMAL
  Filled 2022-09-25: qty 5

## 2022-09-27 NOTE — Progress Notes (Signed)
Continue mammogram 6/25

## 2022-10-15 ENCOUNTER — Other Ambulatory Visit: Payer: Self-pay | Admitting: Family Medicine

## 2022-10-15 DIAGNOSIS — F1190 Opioid use, unspecified, uncomplicated: Secondary | ICD-10-CM

## 2022-10-15 DIAGNOSIS — M170 Bilateral primary osteoarthritis of knee: Secondary | ICD-10-CM

## 2022-10-16 ENCOUNTER — Encounter: Payer: Self-pay | Admitting: Family Medicine

## 2022-10-17 ENCOUNTER — Ambulatory Visit: Payer: Self-pay

## 2022-10-17 DIAGNOSIS — M170 Bilateral primary osteoarthritis of knee: Secondary | ICD-10-CM

## 2022-10-17 DIAGNOSIS — F1190 Opioid use, unspecified, uncomplicated: Secondary | ICD-10-CM

## 2022-10-17 NOTE — Telephone Encounter (Signed)
This encounter was created in error - please disregard.

## 2022-10-17 NOTE — Telephone Encounter (Signed)
Chief Complaint: Covid+ Symptoms: bodyaches, headache, fatigue Frequency: ongoing since Tuesday Pertinent Negatives: Patient denies chest pain, nausea, vomiting Disposition: [] ED /[] Urgent Care (no appt availability in office) / [] Appointment(In office/virtual)/ []  [facility]/ [] Home Care/ [] Refused Recommended Disposition /[] [place] [facility]/ [x]  Follow-up with PCP Additional Notes: Patient stated she tested positive for Covid with a home test 3 days ago. Her son had tested positive the day before and she spent the weekend with him. Patient reports her worse symptoms is her bodyaches and has requested a refill on her pain medication. Advised patient that I would forward the request to her provider for approval and any additional recommendations.    Reason for Disposition  [1] COVID-19 infection suspected by caller or triager AND [2] mild symptoms (cough, fever, or others) AND [3] negative COVID-19 rapid test  Answer Assessment - Initial Assessment Questions 1. COVID-19 DIAGNOSIS: "How do you know that you have COVID?" (e.g., positive lab test or self-test, diagnosed by doctor or NP/PA, symptoms after exposure).     Test positive with a home kit 3 days ago 2. COVID-19 EXPOSURE: "Was there any known exposure to COVID before the symptoms began?" CDC Definition of close contact: within 6 feet (2 meters) for a total of 15 minutes or more over a 24-hour period.      Yes, my son tested positive on Monday 3. ONSET: "When did the COVID-19 symptoms start?"      Tuesday 4. WORST SYMPTOM: "What is your worst symptom?" (e.g., cough, fever, shortness of breath, muscle aches)     Headache, bodyache 5. COUGH: "Do you have a cough?" If Yes, ask: "How bad is the cough?"       Mild cough 6. FEVER: "Do you have a fever?" If Yes, ask: "What is your temperature, how was it measured, and when did it start?"     No I checked it this morning it was 98.2  7. RESPIRATORY STATUS: "Describe your  breathing?" (e.g., normal; shortness of breath, wheezing, unable to speak)      No not right now it is normal 8. BETTER-SAME-WORSE: "Are you getting better, staying the same or getting worse compared to yesterday?"  If getting worse, ask, "In what way?"     Some better  9. OTHER SYMPTOMS: "Do you have any other symptoms?"  (e.g., chills, fatigue, headache, loss of smell or taste, muscle pain, sore throat)     Cough, fatigue, loss of smell 10. HIGH RISK DISEASE: "Do you have any chronic medical problems?" (e.g., asthma, heart or lung disease, weak immune system, obesity, etc.)       No 11. VACCINE: "Have you had the COVID-19 vaccine?" If Yes, ask: "Which one, how many shots, when did you get it?"       Yes the 1st one  Protocols used: Coronavirus (COVID-19) Diagnosed or Suspected-A-AH

## 2022-10-18 ENCOUNTER — Other Ambulatory Visit: Payer: Self-pay | Admitting: Family Medicine

## 2022-10-18 ENCOUNTER — Telehealth: Payer: Self-pay | Admitting: Family Medicine

## 2022-10-18 ENCOUNTER — Telehealth: Payer: Self-pay

## 2022-10-18 ENCOUNTER — Encounter: Payer: Self-pay | Admitting: Family Medicine

## 2022-10-18 DIAGNOSIS — Z79899 Other long term (current) drug therapy: Secondary | ICD-10-CM

## 2022-10-18 DIAGNOSIS — F419 Anxiety disorder, unspecified: Secondary | ICD-10-CM

## 2022-10-18 DIAGNOSIS — G43019 Migraine without aura, intractable, without status migrainosus: Secondary | ICD-10-CM

## 2022-10-18 MED ORDER — OXYCODONE-ACETAMINOPHEN 7.5-325 MG PO TABS
1.0000 | ORAL_TABLET | Freq: Three times a day (TID) | ORAL | 0 refills | Status: DC | PRN
Start: 2022-10-18 — End: 2022-11-14

## 2022-10-18 MED ORDER — SUMATRIPTAN SUCCINATE 50 MG PO TABS
50.0000 mg | ORAL_TABLET | ORAL | 0 refills | Status: DC | PRN
Start: 2022-10-18 — End: 2022-11-06

## 2022-10-18 NOTE — Telephone Encounter (Signed)
oxyCODONE-acetaminophen (PERCOCET) 7.5-325 MG tablet 90 tablet 0 10/18/2022

## 2022-10-18 NOTE — Telephone Encounter (Signed)
Copied from CRM [id number]. Topic: General - Other >> Oct 17, 2022  9:18 AM Franchot Heidelberg wrote: Reason for CRM: Pt called requesting a refill of her oxycodone, she says she requested it 3 days ago however the chart reflects that we received the request on 10/15/2022 from the pharmacy. Please advise

## 2022-10-18 NOTE — Telephone Encounter (Signed)
Pt called in checking status of refill of Oxycodone, I let her know this is being worked on, requested July 30 to [facility].  She is all out of this med

## 2022-10-18 NOTE — Telephone Encounter (Signed)
Rx filled

## 2022-10-21 MED ORDER — ALPRAZOLAM 0.5 MG PO TABS: 0.5000 mg | ORAL_TABLET | Freq: Every day | ORAL | 0 refills | Status: DC | PRN

## 2022-10-26 ENCOUNTER — Encounter: Payer: Self-pay | Admitting: Family Medicine

## 2022-11-06 ENCOUNTER — Other Ambulatory Visit: Payer: Self-pay | Admitting: Family Medicine

## 2022-11-06 DIAGNOSIS — G43019 Migraine without aura, intractable, without status migrainosus: Secondary | ICD-10-CM

## 2022-11-07 ENCOUNTER — Encounter: Payer: Self-pay | Admitting: Family Medicine

## 2022-11-07 MED ORDER — SUMATRIPTAN SUCCINATE 50 MG PO TABS
50.0000 mg | ORAL_TABLET | ORAL | 0 refills | Status: DC | PRN
Start: 2022-11-07 — End: 2022-12-05

## 2022-11-07 NOTE — Telephone Encounter (Signed)
LOV 06/20/2022 LR:10/18/2022 qty:20

## 2022-11-14 ENCOUNTER — Other Ambulatory Visit: Payer: Self-pay | Admitting: Family Medicine

## 2022-11-14 DIAGNOSIS — M170 Bilateral primary osteoarthritis of knee: Secondary | ICD-10-CM

## 2022-11-14 DIAGNOSIS — F1190 Opioid use, unspecified, uncomplicated: Secondary | ICD-10-CM

## 2022-11-14 MED ORDER — OXYCODONE-ACETAMINOPHEN 7.5-325 MG PO TABS
1.0000 | ORAL_TABLET | Freq: Three times a day (TID) | ORAL | 0 refills | Status: DC | PRN
Start: 2022-11-14 — End: 2022-12-12

## 2022-11-20 ENCOUNTER — Other Ambulatory Visit: Payer: Self-pay | Admitting: Family Medicine

## 2022-11-20 DIAGNOSIS — F419 Anxiety disorder, unspecified: Secondary | ICD-10-CM

## 2022-11-20 DIAGNOSIS — Z79899 Other long term (current) drug therapy: Secondary | ICD-10-CM

## 2022-11-21 MED ORDER — ALPRAZOLAM 0.5 MG PO TABS: 0.5000 mg | ORAL_TABLET | Freq: Every day | ORAL | 0 refills | Status: DC | PRN

## 2022-12-05 ENCOUNTER — Other Ambulatory Visit: Payer: Self-pay | Admitting: Family Medicine

## 2022-12-05 DIAGNOSIS — G43019 Migraine without aura, intractable, without status migrainosus: Secondary | ICD-10-CM

## 2022-12-06 MED ORDER — SUMATRIPTAN SUCCINATE 50 MG PO TABS
50.0000 mg | ORAL_TABLET | ORAL | 0 refills | Status: DC | PRN
Start: 2022-12-06 — End: 2023-01-02

## 2022-12-12 ENCOUNTER — Other Ambulatory Visit: Payer: Self-pay | Admitting: Family Medicine

## 2022-12-12 DIAGNOSIS — F1190 Opioid use, unspecified, uncomplicated: Secondary | ICD-10-CM

## 2022-12-12 DIAGNOSIS — M170 Bilateral primary osteoarthritis of knee: Secondary | ICD-10-CM

## 2022-12-12 MED ORDER — OXYCODONE-ACETAMINOPHEN 7.5-325 MG PO TABS
1.0000 | ORAL_TABLET | Freq: Three times a day (TID) | ORAL | 0 refills | Status: DC | PRN
Start: 2022-12-12 — End: 2023-01-13

## 2022-12-21 ENCOUNTER — Other Ambulatory Visit: Payer: Self-pay | Admitting: Family Medicine

## 2022-12-21 DIAGNOSIS — F419 Anxiety disorder, unspecified: Secondary | ICD-10-CM

## 2022-12-21 DIAGNOSIS — Z79899 Other long term (current) drug therapy: Secondary | ICD-10-CM

## 2022-12-24 MED ORDER — ALPRAZOLAM 0.5 MG PO TABS: 0.5000 mg | ORAL_TABLET | Freq: Every day | ORAL | 0 refills | Status: DC | PRN

## 2023-01-02 ENCOUNTER — Other Ambulatory Visit: Payer: Self-pay | Admitting: Family Medicine

## 2023-01-02 ENCOUNTER — Encounter: Payer: Self-pay | Admitting: Family Medicine

## 2023-01-02 DIAGNOSIS — G43019 Migraine without aura, intractable, without status migrainosus: Secondary | ICD-10-CM

## 2023-01-02 MED ORDER — SUMATRIPTAN SUCCINATE 50 MG PO TABS
50.0000 mg | ORAL_TABLET | ORAL | 0 refills | Status: DC | PRN
Start: 2023-01-02 — End: 2023-03-14

## 2023-01-09 ENCOUNTER — Other Ambulatory Visit: Payer: Self-pay | Admitting: Family Medicine

## 2023-01-09 DIAGNOSIS — F1190 Opioid use, unspecified, uncomplicated: Secondary | ICD-10-CM

## 2023-01-09 DIAGNOSIS — M170 Bilateral primary osteoarthritis of knee: Secondary | ICD-10-CM

## 2023-01-10 ENCOUNTER — Other Ambulatory Visit: Payer: Self-pay | Admitting: Family Medicine

## 2023-01-10 DIAGNOSIS — F1190 Opioid use, unspecified, uncomplicated: Secondary | ICD-10-CM

## 2023-01-10 DIAGNOSIS — M170 Bilateral primary osteoarthritis of knee: Secondary | ICD-10-CM

## 2023-01-11 ENCOUNTER — Encounter: Payer: Self-pay | Admitting: Family Medicine

## 2023-01-13 NOTE — Telephone Encounter (Signed)
Requested medication (s) are due for refill today:yes   Requested medication (s) are on the active medication list: yes  Last refill:12/12/22 #90   Future visit scheduled: yes  Notes to clinic:  med not delegated to NT to RF   Requested Prescriptions  Pending Prescriptions Disp Refills   oxyCODONE-acetaminophen (PERCOCET) 7.5-325 MG tablet [Pharmacy Med Name: OXYCODONE-APAP 7.5-325 MG TAB] 90 tablet     Sig: TAKE 1 TABLET BY MOUTH EVERY 8 HOURS AS NEEDED FOR SEVERE PAIN     Not Delegated - Analgesics:  Opioid Agonist Combinations Failed - 01/10/2023  3:17 PM      Failed - This refill cannot be delegated      Failed - Urine Drug Screen completed in last 360 days      Failed - Valid encounter within last 3 months    Recent Outpatient Visits           6 months ago Annual physical exam   [facility] Jacky Kindle, FNP   11 months ago Anxiety   [facility] Merita Norton T, FNP   1 year ago Community acquired pneumonia of left lower lobe of lung   [facility] [place], Marzella Schlein, MD   1 year ago Snoring   [facility] Caro Laroche, DO   1 year ago Chronic, continuous use of opioids   [facility] Jacky Kindle, FNP       Future Appointments             In 4 weeks Jacky Kindle, FNP [facility]

## 2023-01-25 ENCOUNTER — Other Ambulatory Visit: Payer: Self-pay | Admitting: Family Medicine

## 2023-01-25 DIAGNOSIS — Z79899 Other long term (current) drug therapy: Secondary | ICD-10-CM

## 2023-01-25 DIAGNOSIS — F419 Anxiety disorder, unspecified: Secondary | ICD-10-CM

## 2023-02-10 ENCOUNTER — Encounter: Payer: Self-pay | Admitting: Family Medicine

## 2023-02-10 ENCOUNTER — Other Ambulatory Visit: Payer: Self-pay | Admitting: Family Medicine

## 2023-02-10 DIAGNOSIS — M170 Bilateral primary osteoarthritis of knee: Secondary | ICD-10-CM

## 2023-02-10 DIAGNOSIS — F1190 Opioid use, unspecified, uncomplicated: Secondary | ICD-10-CM

## 2023-02-11 ENCOUNTER — Encounter: Payer: Self-pay | Admitting: Family Medicine

## 2023-02-11 ENCOUNTER — Telehealth: Payer: 59 | Admitting: Family Medicine

## 2023-02-11 ENCOUNTER — Ambulatory Visit: Payer: 59 | Admitting: Family Medicine

## 2023-02-11 DIAGNOSIS — F1190 Opioid use, unspecified, uncomplicated: Secondary | ICD-10-CM

## 2023-02-11 DIAGNOSIS — M170 Bilateral primary osteoarthritis of knee: Secondary | ICD-10-CM | POA: Diagnosis not present

## 2023-02-11 MED ORDER — OXYCODONE-ACETAMINOPHEN 7.5-325 MG PO TABS
1.0000 | ORAL_TABLET | Freq: Three times a day (TID) | ORAL | 0 refills | Status: AC | PRN
Start: 2023-02-11 — End: 2023-03-13

## 2023-02-11 NOTE — Assessment & Plan Note (Addendum)
Patient requesting one month supply of percocet. She states pain mgmt regimen is working for her. She has a planned follow up visit on 03/07/23 with PCP Merita Norton, FNP.   Education provided to patient. Patient is aware of risks of opioid medication use to include increased sedation, respiratory suppression, falls, dependence and cardiovascular events.  Patient would like to continue treatment as benefit determined to outweigh risk.     Request for refills of percocet.  Plans to see PCP on 03/07/23.   Pt asked if she had any further questions, which she had none. Confirm pharmacy with pt. Will send.

## 2023-02-11 NOTE — Progress Notes (Signed)
MyChart Video Visit    Virtual Visit via Video Note   This format is felt to be most appropriate for this patient at this time. Physical exam was limited by quality of the video and audio technology used for the visit.   Patient location: home Provider location: [facility]  Telemedicine provided through video. There were technical difficulties in the pt's audio, but pt able to hear provider and communicate through video chat. Attempted to restart video, but still had audio trouble for pt. Pt was able to hear and see provider and she was able to continue to communicate needs to provider with use of video chat.   I discussed the limitations of evaluation and management by telemedicine and the availability of in person appointments. The patient expressed understanding and agreed to proceed.  Patient: Kristin Aguirre   DOB: 01-16-1961   61 y.o. Female  MRN: [id number] Visit Date: 02/11/2023  Today's healthcare provider: Sallee Provencal, FNP   Chief Complaint  Patient presents with   Medical Management of Chronic Issues   Subjective    HPI   Pt presents to refill on percocet. She has planned visit with PCP in one month, but will run out before the appointment. She would like a refill.     Medications: Outpatient Medications Prior to Visit  Medication Sig   ALPRAZolam (XANAX) 0.5 MG tablet Take 1 tablet (0.5 mg total) by mouth daily as needed. Due for an office visit for additional refills.   aspirin EC 81 MG tablet Take 81 mg by mouth daily. Swallow whole.   citalopram (CELEXA) 40 MG tablet Take 1 tablet by mouth daily.   fluticasone (FLONASE) 50 MCG/ACT nasal spray Place 2 sprays into both nostrils daily. (Patient taking differently: Place 2 sprays into both nostrils as needed.)   lisinopril (ZESTRIL) 10 MG tablet Take 1 tablet (10 mg total) by mouth daily.   ondansetron (ZOFRAN) 4 MG tablet Take 1 tablet (4 mg total) by mouth daily as needed for nausea or  vomiting.   rosuvastatin (CRESTOR) 10 MG tablet Take 1 tablet (10 mg total) by mouth daily.   SUMAtriptan (IMITREX) 50 MG tablet Take 1 tablet by mouth as needed for migraine. May repeat in 2 hours if headache persists or recurs. Max 2 tablets per day.   Vitamin D, Ergocalciferol, (DRISDOL) 1.25 MG (50000 UNIT) CAPS capsule Take 1 capsule (50,000 Units total) by mouth every 7 (seven) days.   [DISCONTINUED] oxyCODONE-acetaminophen (PERCOCET) 7.5-325 MG tablet Take 1 tablet by mouth every 8 (eight) hours as needed for severe pain (pain score 7-10).   nicotine (NICODERM CQ - DOSED IN MG/24 HOURS) 14 mg/24hr patch Place 1 patch (14 mg total) onto the skin daily.   No facility-administered medications prior to visit.    Review of Systems Negative unless stated in HPI.   Objective    LMP 03/18/2001 (Approximate)   BP Readings from Last 3 Encounters:  08/02/22 117/60  06/20/22 129/63  05/06/22 132/73   Wt Readings from Last 3 Encounters:  08/02/22 252 lb (114.3 kg)  06/20/22 243 lb (110.2 kg)  05/06/22 243 lb 12.8 oz (110.6 kg)   .  Physical Exam  Not not visible during video, had video off. She was able to show self due to technical difficulties   Assessment & Plan     Problem List Items Addressed This Visit       Musculoskeletal and Integument   Primary osteoarthritis of both knees -  Primary    Patient requesting one month supply of percocet. She states pain mgmt regimen is working for her. She has a planned follow up visit on 03/07/23 with PCP Merita Norton, FNP.   Education provided to patient. Patient is aware of risks of opioid medication use to include increased sedation, respiratory suppression, falls, dependence and cardiovascular events.  Patient would like to continue treatment as benefit determined to outweigh risk.     Request for refills of percocet.  Plans to see PCP on 03/07/23.   Pt asked if she had any further questions, which she had none. Confirm pharmacy  with pt. Will send.       Relevant Medications   oxyCODONE-acetaminophen (PERCOCET) 7.5-325 MG tablet     Other   Chronic, continuous use of opioids   Relevant Medications   oxyCODONE-acetaminophen (PERCOCET) 7.5-325 MG tablet   Kristin Aguirre has scheduled appointment on 03/07/23 with PCP, Merita Norton, at [facility].   I discussed the assessment and treatment plan with the patient. The patient was provided an opportunity to ask questions and all were answered. The patient agreed with the plan and demonstrated an understanding of the instructions.   The patient was advised to call back or seek an in-person evaluation if the symptoms worsen or if the condition fails to improve as anticipated.  I provided 10 minutes of non-face-to-face time during this encounter.  I, Sallee Provencal, FNP, have reviewed all documentation for this visit. The documentation on 02/11/23 for the exam, diagnosis, procedures, and orders are all accurate and complete.   Sallee Provencal, FNP [facility] [phone] (phone) [phone] (fax)  [facility]

## 2023-03-05 ENCOUNTER — Encounter: Payer: Self-pay | Admitting: Family Medicine

## 2023-03-07 ENCOUNTER — Ambulatory Visit: Payer: 59 | Admitting: Family Medicine

## 2023-03-14 ENCOUNTER — Encounter: Payer: Self-pay | Admitting: Family Medicine

## 2023-03-14 ENCOUNTER — Ambulatory Visit (INDEPENDENT_AMBULATORY_CARE_PROVIDER_SITE_OTHER): Payer: 59 | Admitting: Family Medicine

## 2023-03-14 VITALS — BP 132/84 | HR 79 | Temp 97.9°F | Resp 16 | Ht 69.0 in | Wt 265.5 lb

## 2023-03-14 DIAGNOSIS — F39 Unspecified mood [affective] disorder: Secondary | ICD-10-CM | POA: Insufficient documentation

## 2023-03-14 DIAGNOSIS — I10 Essential (primary) hypertension: Secondary | ICD-10-CM

## 2023-03-14 DIAGNOSIS — E7800 Pure hypercholesterolemia, unspecified: Secondary | ICD-10-CM | POA: Diagnosis not present

## 2023-03-14 DIAGNOSIS — E559 Vitamin D deficiency, unspecified: Secondary | ICD-10-CM

## 2023-03-14 DIAGNOSIS — M545 Low back pain, unspecified: Secondary | ICD-10-CM

## 2023-03-14 DIAGNOSIS — G43019 Migraine without aura, intractable, without status migrainosus: Secondary | ICD-10-CM

## 2023-03-14 DIAGNOSIS — G8929 Other chronic pain: Secondary | ICD-10-CM

## 2023-03-14 MED ORDER — ALPRAZOLAM 0.5 MG PO TABS: 0.5000 mg | ORAL_TABLET | Freq: Every day | ORAL | 1 refills | Status: DC | PRN

## 2023-03-14 MED ORDER — LISINOPRIL 10 MG PO TABS
10.0000 mg | ORAL_TABLET | Freq: Every day | ORAL | 3 refills | Status: AC
Start: 2023-03-14 — End: ?

## 2023-03-14 MED ORDER — ONDANSETRON HCL 4 MG PO TABS: 4.0000 mg | ORAL_TABLET | Freq: Every day | ORAL | 0 refills | Status: DC | PRN

## 2023-03-14 MED ORDER — SUMATRIPTAN SUCCINATE 50 MG PO TABS
50.0000 mg | ORAL_TABLET | ORAL | 5 refills | Status: AC | PRN
Start: 2023-03-14 — End: ?

## 2023-03-14 MED ORDER — CITALOPRAM HYDROBROMIDE 40 MG PO TABS
40.0000 mg | ORAL_TABLET | Freq: Every day | ORAL | 3 refills | Status: AC
Start: 2023-03-14 — End: ?

## 2023-03-14 MED ORDER — OXYCODONE-ACETAMINOPHEN 7.5-325 MG PO TABS
1.0000 | ORAL_TABLET | Freq: Three times a day (TID) | ORAL | 0 refills | Status: DC | PRN
Start: 2023-03-14 — End: 2023-04-11

## 2023-03-14 MED ORDER — VITAMIN D (ERGOCALCIFEROL) 1.25 MG (50000 UNIT) PO CAPS: 50000.0000 [IU] | ORAL_CAPSULE | ORAL | 0 refills | Status: DC

## 2023-03-14 MED ORDER — ROSUVASTATIN CALCIUM 10 MG PO TABS
10.0000 mg | ORAL_TABLET | Freq: Every day | ORAL | 3 refills | Status: AC
Start: 2023-03-14 — End: ?

## 2023-03-14 NOTE — Progress Notes (Unsigned)
Established patient visit  Patient: Kristin Aguirre   DOB: [birth year]   62 y.o. Female  MRN: [id number] Visit Date: 03/14/2023  Today's healthcare provider: Jacky Kindle, FNP  Introduced to nurse practitioner role and practice setting.  All questions answered.  Discussed provider/patient relationship and expectations.  Chief Complaint  Patient presents with   Follow-up    Going over medications and check up    Subjective    HPI HPI     Follow-up    Additional comments: Going over medications and check up       Last edited by Clois Comber on 03/14/2023  4:05 PM.      Patient request for chronic refills; notes some situational and social changes.  She continues to care for her disabled brother. She is now living with her son, DIL and their children. She is no longer dating her BF.  Medications: Outpatient Medications Prior to Visit  Medication Sig   aspirin EC 81 MG tablet Take 81 mg by mouth daily. Swallow whole.   fluticasone (FLONASE) 50 MCG/ACT nasal spray Place 2 sprays into both nostrils daily.   [DISCONTINUED] ALPRAZolam (XANAX) 0.5 MG tablet Take 1 tablet (0.5 mg total) by mouth daily as needed. Due for an office visit for additional refills.   [DISCONTINUED] citalopram (CELEXA) 40 MG tablet Take 1 tablet by mouth daily.   [DISCONTINUED] lisinopril (ZESTRIL) 10 MG tablet Take 1 tablet (10 mg total) by mouth daily.   [DISCONTINUED] nicotine (NICODERM CQ - DOSED IN MG/24 HOURS) 14 mg/24hr patch Place 1 patch (14 mg total) onto the skin daily.   [DISCONTINUED] ondansetron (ZOFRAN) 4 MG tablet Take 1 tablet (4 mg total) by mouth daily as needed for nausea or vomiting.   [DISCONTINUED] rosuvastatin (CRESTOR) 10 MG tablet Take 1 tablet (10 mg total) by mouth daily.   [DISCONTINUED] SUMAtriptan (IMITREX) 50 MG tablet Take 1 tablet by mouth as needed for migraine. May repeat in 2 hours if headache persists or recurs. Max 2 tablets per day.   [DISCONTINUED] Vitamin  D, Ergocalciferol, (DRISDOL) 1.25 MG (50000 UNIT) CAPS capsule Take 1 capsule (50,000 Units total) by mouth every 7 (seven) days.   No facility-administered medications prior to visit.   Last CBC Lab Results  Component Value Date   WBC 7.7 06/20/2022   HGB 10.7 (L) 06/20/2022   HCT 33.3 (L) 06/20/2022   MCV 88 06/20/2022   MCH 28.4 06/20/2022   RDW 12.7 06/20/2022   PLT 288 06/20/2022   Last metabolic panel Lab Results  Component Value Date   GLUCOSE 115 (H) 06/20/2022   NA 141 06/20/2022   K 4.7 06/20/2022   CL 102 06/20/2022   CO2 23 06/20/2022   BUN 19 06/20/2022   CREATININE 1.07 (H) 06/20/2022   EGFR 59 (L) 06/20/2022   CALCIUM 9.3 06/20/2022   PROT 7.4 06/20/2022   ALBUMIN 4.2 06/20/2022   LABGLOB 3.2 06/20/2022   AGRATIO 1.3 06/20/2022   BILITOT 0.2 06/20/2022   ALKPHOS 83 06/20/2022   AST 17 06/20/2022   ALT 14 06/20/2022   ANIONGAP 11 12/17/2021   Last lipids Lab Results  Component Value Date   CHOL 180 06/20/2022   HDL 57 06/20/2022   LDLCALC 99 06/20/2022   TRIG 134 06/20/2022   CHOLHDL 3.2 06/20/2022   Last hemoglobin A1c Lab Results  Component Value Date   HGBA1C 5.7 (H) 06/20/2022   Last thyroid functions Lab Results  Component Value Date  TSH 1.340 06/20/2022   Last vitamin D Lab Results  Component Value Date   VD25OH 26.0 (L) 06/20/2022   Last vitamin B12 and Folate No results found for: "VITAMINB12", "FOLATE"     Objective    BP 132/84 (BP Location: Right Arm, Patient Position: Sitting, Cuff Size: Normal)   Pulse 79   Temp 97.9 F (36.6 C)   Resp 16   Ht 5\' 9"  (1.753 m)   Wt 265 lb 8 oz (120.4 kg)   LMP 03/18/2001 (Approximate)   SpO2 99%   BMI 39.21 kg/m   BP Readings from Last 3 Encounters:  03/14/23 132/84  08/02/22 117/60  06/20/22 129/63   Wt Readings from Last 3 Encounters:  03/14/23 265 lb 8 oz (120.4 kg)  08/02/22 252 lb (114.3 kg)  06/20/22 243 lb (110.2 kg)   SpO2 Readings from Last 3 Encounters:   03/14/23 99%  08/02/22 98%  06/20/22 100%   Physical Exam Vitals and nursing note reviewed.  Constitutional:      General: She is not in acute distress.    Appearance: Normal appearance. She is obese. She is not ill-appearing, toxic-appearing or diaphoretic.  HENT:     Head: Normocephalic and atraumatic.     Ears:     Comments: Complaints of dry skin in external ear canal; continue to monitor. Advised use of external lotion or hydrocortisone to assist as needed. Cardiovascular:     Rate and Rhythm: Normal rate and regular rhythm.     Pulses: Normal pulses.     Heart sounds: Normal heart sounds. No murmur heard.    No friction rub. No gallop.  Pulmonary:     Effort: Pulmonary effort is normal. No respiratory distress.     Breath sounds: Normal breath sounds. No stridor. No wheezing, rhonchi or rales.  Chest:     Chest wall: No tenderness.  Musculoskeletal:        General: No swelling, tenderness, deformity or signs of injury. Normal range of motion.     Right lower leg: No edema.     Left lower leg: No edema.  Skin:    General: Skin is warm and dry.     Capillary Refill: Capillary refill takes less than 2 seconds.     Coloration: Skin is not jaundiced or pale.     Findings: No bruising, erythema, lesion or rash.  Neurological:     General: No focal deficit present.     Mental Status: She is alert and oriented to person, place, and time. Mental status is at baseline.     Cranial Nerves: No cranial nerve deficit.     Sensory: No sensory deficit.     Motor: No weakness.     Coordination: Coordination normal.  Psychiatric:        Mood and Affect: Mood normal.        Behavior: Behavior normal.        Thought Content: Thought content normal.        Judgment: Judgment normal.     No results found for any visits on 03/14/23.  Assessment & Plan     Problem List Items Addressed This Visit       Cardiovascular and Mediastinum   Essential hypertension   Chronic, borderline  goal remains 119/79 or less Consider titration from lisinopril 10 to 20/30 mg daily to further assist      Relevant Medications   lisinopril (ZESTRIL) 10 MG tablet   rosuvastatin (CRESTOR) 10 MG tablet  Intractable migraine without aura and without status migrainosus   Chronic, stable; intermittent Request for refills      Relevant Medications   citalopram (CELEXA) 40 MG tablet   lisinopril (ZESTRIL) 10 MG tablet   rosuvastatin (CRESTOR) 10 MG tablet   SUMAtriptan (IMITREX) 50 MG tablet   oxyCODONE-acetaminophen (PERCOCET) 7.5-325 MG tablet     Other   Avitaminosis D   Relevant Medications   Vitamin D, Ergocalciferol, (DRISDOL) 1.25 MG (50000 UNIT) CAPS capsule   Chronic low back pain without sciatica   Chronic, request to continue medications to assist RP and PT are aware of risks of opioid medication use to include increased sedation, respiratory suppression, falls, dependence and cardiovascular events.  RP and PT would like to continue treatment as benefit determined to outweigh risk.         Relevant Medications   citalopram (CELEXA) 40 MG tablet   oxyCODONE-acetaminophen (PERCOCET) 7.5-325 MG tablet   Hyperlipidemia   Chronic, remains on crestor 10 The 10-year ASCVD risk score (Arnett DK, et al., 2019) is: 5.9% LDL goal remains <70      Relevant Medications   lisinopril (ZESTRIL) 10 MG tablet   rosuvastatin (CRESTOR) 10 MG tablet   Mood disorder (HCC) - Primary   Acute stressors on chronic mood disorder; request for refills on celexa 40 daily and prn low dose xanax to assist with anxiety flares Continue 6 months appts for continued use Recommend stop of xanax at 65 d/t risk of cognitive decline       Relevant Medications   ALPRAZolam (XANAX) 0.5 MG tablet   citalopram (CELEXA) 40 MG tablet   Return in about 6 months (around 09/12/2023) for chonic disease management.     Leilani Merl, FNP, have reviewed all documentation for this visit. The documentation  on 03/15/23 for the exam, diagnosis, procedures, and orders are all accurate and complete.  Jacky Kindle, FNP  [facility] [phone] (phone) [phone] (fax)  [facility]

## 2023-03-14 NOTE — Patient Instructions (Signed)
The CDC recommends two doses of Shingrix (the new shingles vaccine) separated by 2 to 6 months for adults age 63 years and older. I recommend checking with your insurance plan regarding coverage for this vaccine.    

## 2023-03-15 NOTE — Assessment & Plan Note (Signed)
Acute stressors on chronic mood disorder; request for refills on celexa 40 daily and prn low dose xanax to assist with anxiety flares Continue 6 months appts for continued use Recommend stop of xanax at 65 d/t risk of cognitive decline

## 2023-03-15 NOTE — Assessment & Plan Note (Signed)
Chronic, borderline goal remains 119/79 or less Consider titration from lisinopril 10 to 20/30 mg daily to further assist

## 2023-03-15 NOTE — Assessment & Plan Note (Signed)
Chronic, stable; intermittent Request for refills

## 2023-03-15 NOTE — Assessment & Plan Note (Signed)
Chronic, remains on crestor 10 The 10-year ASCVD risk score (Arnett DK, et al., 2019) is: 5.9% LDL goal remains <70

## 2023-03-15 NOTE — Assessment & Plan Note (Signed)
Chronic, request to continue medications to assist RP and PT are aware of risks of opioid medication use to include increased sedation, respiratory suppression, falls, dependence and cardiovascular events.  RP and PT would like to continue treatment as benefit determined to outweigh risk.

## 2023-04-09 ENCOUNTER — Encounter: Payer: Self-pay | Admitting: Family Medicine

## 2023-04-10 NOTE — Telephone Encounter (Signed)
Pt called in stating that she slipped and fell in a hole this morning and twisted her leg and was wanting to follow up on her pain medicine. Please advise.

## 2023-04-11 ENCOUNTER — Other Ambulatory Visit: Payer: Self-pay | Admitting: Family Medicine

## 2023-04-11 DIAGNOSIS — G8929 Other chronic pain: Secondary | ICD-10-CM

## 2023-04-11 MED ORDER — OXYCODONE-ACETAMINOPHEN 7.5-325 MG PO TABS
1.0000 | ORAL_TABLET | Freq: Three times a day (TID) | ORAL | 0 refills | Status: DC | PRN
Start: 2023-04-11 — End: 2023-05-09

## 2023-04-11 NOTE — Telephone Encounter (Signed)
Medication Refill -  Most Recent Primary Care Visit:  Provider: Merita Norton T  Department: [facility]  Visit Type: OFFICE VISIT  Date: 03/14/2023  Medication: oxyCODONE-acetaminophen (PERCOCET) 7.5-325 MG tablet [[id number]]   Has the patient contacted their pharmacy? Yes  (Agent: If yes, when and what did the pharmacy advise?) Contact Office   Is this the correct pharmacy for this prescription? Yes  This is the patient's preferred pharmacy:  [facility] - [place], [place] - [address] [place] [address] Phone: [phone] Fax: [phone]   Has the prescription been filled recently? Yes  Is the patient out of the medication? Yes  Has the patient been seen for an appointment in the last year OR does the patient have an upcoming appointment? Yes  Can we respond through MyChart? Yes  Agent: Please be advised that Rx refills may take up to 3 business days. We ask that you follow-up with your pharmacy.   Pt is requesting someone give her a call regarding this refill.

## 2023-04-17 ENCOUNTER — Encounter: Payer: Self-pay | Admitting: Family Medicine

## 2023-04-18 ENCOUNTER — Other Ambulatory Visit: Payer: Self-pay | Admitting: Family Medicine

## 2023-04-18 DIAGNOSIS — J324 Chronic pansinusitis: Secondary | ICD-10-CM

## 2023-04-18 DIAGNOSIS — F17210 Nicotine dependence, cigarettes, uncomplicated: Secondary | ICD-10-CM

## 2023-04-18 MED ORDER — FLUTICASONE PROPIONATE 50 MCG/ACT NA SUSP
2.0000 | Freq: Every day | NASAL | 0 refills | Status: DC
Start: 2023-04-18 — End: 2023-10-24

## 2023-04-18 MED ORDER — NICOTINE 14 MG/24HR TD PT24
14.0000 mg | MEDICATED_PATCH | Freq: Every day | TRANSDERMAL | 0 refills | Status: AC
Start: 2023-04-18 — End: ?

## 2023-05-09 ENCOUNTER — Encounter: Payer: Self-pay | Admitting: Family Medicine

## 2023-05-09 ENCOUNTER — Other Ambulatory Visit: Payer: Self-pay | Admitting: Family Medicine

## 2023-05-09 DIAGNOSIS — M545 Low back pain, unspecified: Secondary | ICD-10-CM

## 2023-05-09 MED ORDER — OXYCODONE-ACETAMINOPHEN 7.5-325 MG PO TABS
1.0000 | ORAL_TABLET | Freq: Three times a day (TID) | ORAL | 0 refills | Status: DC | PRN
Start: 2023-05-09 — End: 2023-05-16

## 2023-05-16 ENCOUNTER — Encounter: Payer: Self-pay | Admitting: Family Medicine

## 2023-05-16 ENCOUNTER — Telehealth (INDEPENDENT_AMBULATORY_CARE_PROVIDER_SITE_OTHER): Payer: 59 | Admitting: Family Medicine

## 2023-05-16 DIAGNOSIS — M2540 Effusion, unspecified joint: Secondary | ICD-10-CM

## 2023-05-16 DIAGNOSIS — M545 Low back pain, unspecified: Secondary | ICD-10-CM | POA: Diagnosis not present

## 2023-05-16 DIAGNOSIS — R635 Abnormal weight gain: Secondary | ICD-10-CM

## 2023-05-16 DIAGNOSIS — M170 Bilateral primary osteoarthritis of knee: Secondary | ICD-10-CM

## 2023-05-16 DIAGNOSIS — G43019 Migraine without aura, intractable, without status migrainosus: Secondary | ICD-10-CM

## 2023-05-16 DIAGNOSIS — G8929 Other chronic pain: Secondary | ICD-10-CM

## 2023-05-16 DIAGNOSIS — F17290 Nicotine dependence, other tobacco product, uncomplicated: Secondary | ICD-10-CM

## 2023-05-16 MED ORDER — OXYCODONE-ACETAMINOPHEN 7.5-325 MG PO TABS
1.0000 | ORAL_TABLET | Freq: Three times a day (TID) | ORAL | 0 refills | Status: DC | PRN
Start: 2023-05-16 — End: 2023-06-20

## 2023-05-16 NOTE — Assessment & Plan Note (Signed)
 Chronic swelling in hands Limiting ROM and daily tasks Fam hx of arthritis While visit was virtual pt able to show hands to provider, visual joint swelling in bilateral fingers with mild erythema Increased use of cane due to joint pain and swelling Referral to [facility]

## 2023-05-16 NOTE — Assessment & Plan Note (Signed)
 Chronic, request to continue opioids medications to assist Pt is aware of risks of opioid medication use to include increased sedation, respiratory suppression, falls, dependence and cardiovascular events.   PT would like to continue treatment as benefit determined to outweigh risk. Refill placed Pt would also like to start seeing pain mgmt - referral placed to [facility] per pt request

## 2023-05-16 NOTE — Assessment & Plan Note (Signed)
 Chronic back pain Use of Cane due to limited mobility 10+ years of opioid use Only able to visit virtually today for recheck PDMP checked Refill of oxycodone-acetaminophen 7.5mg /325mg  prn q8 placed Referral to pain mgmt - request to [facility] - brother seen there Decline PT at this time

## 2023-05-16 NOTE — Assessment & Plan Note (Addendum)
 Patient reports significant weight gain, possibly related to decreased mobility due to pain. -Recommend in-person visit for weight loss check, blood pressure check, and lab work in the next two months. Recommended diet and nutrition referral - pt will think about it -Advise patient to check with insurance regarding coverage for weight loss medications. - Continue to make conscious decisions for well balanced diet smaller portions with increase protein, fruits, veggies, water as drink of choice, decrease starches, processed foods, and saturated fats.

## 2023-05-16 NOTE — Assessment & Plan Note (Signed)
 Patient has been using nicotine patches and reports almost stopping vaping. -Continue current nicotine patches as patient still has some left.

## 2023-05-16 NOTE — Assessment & Plan Note (Signed)
 Chronic, stable Continue prn Imitrex Pt UTD on refills

## 2023-05-16 NOTE — Progress Notes (Signed)
 Virtual Visit via Video Note  I connected with Shonice Wrisley Lorentz on 05/16/23 at 10:20 AM EST by a video enabled telemedicine application and verified that I am speaking with the correct person using two identifiers.  Patient Location: Home Provider Location: Office/Clinic  I discussed the limitations, risks, security, and privacy concerns of performing an evaluation and management service by video and the availability of in person appointments. I also discussed with the patient that there may be a patient responsible charge related to this service. The patient expressed understanding and agreed to proceed.  Introduced to Publishing rights manager role and practice setting.  All questions answered.  Discussed provider/patient relationship and expectations.   Subjective: PCP: Sallee Provencal, FNP  No chief complaint on file.  Kristin Aguirre is a 63 year old female with rheumatoid arthritis who presents with joint pain and medication refill request.  She experiences significant joint pain, particularly in her fingers, knees, and back, which she attributes to concern for rheumatoid arthritis. Her fingers are described as 'crooked' and 'swollen,' with severe pain upon minor contact. She has not previously seen a rheumatologist. She uses a cane due to difficulty walking, and her condition has worsened over time.  She has been on pain medications for 10-15 years, which provide her only relief, and she is seeking a refill for these medications. Additionally, Imitrex, which she uses for headaches.  She has been advised to have a knee replacement on her right knee but is unable to proceed with the surgery at this time. She is concerned about weight gain due to her inability to exercise because of her knee and arthritis pain.  Also has concern for weight gain  - She has previously consulted a dietician who advised her to eat more greens and healthy foods, but she finds it difficult to exercise due to her  pain.   Tobacco use - She has recently started using nicotine patches to quit vaping and has not vaped for about two weeks.  She lives with her son and two grandchildren and helps care for her brother who has spinal stenosis, dementia, and Parkinson's disease.   ROS: Per HPI  Current Outpatient Medications:    ALPRAZolam (XANAX) 0.5 MG tablet, Take 1 tablet (0.5 mg total) by mouth daily as needed., Disp: 90 tablet, Rfl: 1   aspirin EC 81 MG tablet, Take 81 mg by mouth daily. Swallow whole., Disp: , Rfl:    citalopram (CELEXA) 40 MG tablet, Take 1 tablet (40 mg total) by mouth daily., Disp: 90 tablet, Rfl: 3   fluticasone (FLONASE) 50 MCG/ACT nasal spray, Place 2 sprays into both nostrils daily., Disp: 48 g, Rfl: 0   lisinopril (ZESTRIL) 10 MG tablet, Take 1 tablet (10 mg total) by mouth daily., Disp: 90 tablet, Rfl: 3   nicotine (NICODERM CQ - DOSED IN MG/24 HOURS) 14 mg/24hr patch, Place 1 patch (14 mg total) onto the skin daily., Disp: 28 patch, Rfl: 0   ondansetron (ZOFRAN) 4 MG tablet, Take 1 tablet (4 mg total) by mouth daily as needed for nausea or vomiting., Disp: 30 tablet, Rfl: 0   oxyCODONE-acetaminophen (PERCOCET) 7.5-325 MG tablet, Take 1 tablet by mouth every 8 (eight) hours as needed for severe pain (pain score 7-10)., Disp: 90 tablet, Rfl: 0   rosuvastatin (CRESTOR) 10 MG tablet, Take 1 tablet (10 mg total) by mouth daily., Disp: 90 tablet, Rfl: 3   SUMAtriptan (IMITREX) 50 MG tablet, Take 1 tablet by mouth as needed  for migraine. May repeat in 2 hours if headache persists or recurs. Max 2 tablets per day., Disp: 20 tablet, Rfl: 5   Vitamin D, Ergocalciferol, (DRISDOL) 1.25 MG (50000 UNIT) CAPS capsule, Take 1 capsule (50,000 Units total) by mouth every 7 (seven) days., Disp: 13 capsule, Rfl: 0  Observations/Objective: There were no vitals filed for this visit. Physical Exam Constitutional:      General: She is not in acute distress.    Appearance: Normal appearance. She is  obese. She is not ill-appearing, toxic-appearing or diaphoretic.  Eyes:     Extraocular Movements: Extraocular movements intact.     Pupils: Pupils are equal, round, and reactive to light.  Pulmonary:     Effort: Pulmonary effort is normal.     Breath sounds: Normal breath sounds.  Musculoskeletal:     Cervical back: Normal range of motion.  Skin:    General: Skin is dry.  Neurological:     General: No focal deficit present.     Mental Status: She is alert and oriented to person, place, and time. Mental status is at baseline.     Cranial Nerves: No cranial nerve deficit.  Psychiatric:        Attention and Perception: Attention and perception normal.        Mood and Affect: Mood normal. Affect is tearful.        Speech: Speech normal.        Behavior: Behavior is uncooperative.        Thought Content: Thought content normal.        Cognition and Memory: Cognition and memory normal.    Assessment and Plan: Chronic bilateral low back pain without sciatica Assessment & Plan: Chronic, request to continue opioids medications to assist Pt is aware of risks of opioid medication use to include increased sedation, respiratory suppression, falls, dependence and cardiovascular events.   PT would like to continue treatment as benefit determined to outweigh risk. Refill placed Pt would also like to start seeing pain mgmt - referral placed to [facility] per pt request    Orders: -     oxyCODONE-Acetaminophen; Take 1 tablet by mouth every 8 (eight) hours as needed for severe pain (pain score 7-10).  Dispense: 90 tablet; Refill: 0 -     Ambulatory referral to Pain Clinic  Intractable migraine without aura and without status migrainosus Assessment & Plan: Chronic, stable Continue prn Imitrex Pt UTD on refills   Joint swelling Assessment & Plan: Chronic swelling in hands Limiting ROM and daily tasks Fam hx of arthritis While visit was virtual pt able to show hands to provider, visual joint  swelling in bilateral fingers with mild erythema Increased use of cane due to joint pain and swelling Referral to [facility]  Orders: -     Ambulatory referral to Rheumatology  Primary osteoarthritis of both knees Assessment & Plan: Chronic back pain Use of Cane due to limited mobility 10+ years of opioid use Only able to visit virtually today for recheck PDMP checked Refill of oxycodone-acetaminophen 7.5mg /325mg  prn q8 placed Referral to pain mgmt - request to [facility] - brother seen there Decline PT at this time  Orders: -     Ambulatory referral to Rheumatology  Weight gain Assessment & Plan: Patient reports significant weight gain, possibly related to decreased mobility due to pain. -Recommend in-person visit for weight loss check, blood pressure check, and lab work in the next two months. Recommended diet and nutrition referral - pt will think about  it -Advise patient to check with insurance regarding coverage for weight loss medications. - Continue to make conscious decisions for well balanced diet smaller portions with increase protein, fruits, veggies, water as drink of choice, decrease starches, processed foods, and saturated fats.    Vaping nicotine dependence, tobacco product Assessment & Plan: Patient has been using nicotine patches and reports almost stopping vaping. -Continue current nicotine patches as patient still has some left.    Follow Up Instructions: Return in about 5 weeks (around 06/20/2023) for Labs, BP check, weight loss - in person visit.   I discussed the assessment and treatment plan with the patient. The patient was provided an opportunity to ask questions, and all were answered. The patient agreed with the plan and demonstrated an understanding of the instructions.   The patient was advised to call back or seek an in-person evaluation if the symptoms worsen or if the condition fails to improve as anticipated.  The above assessment and management plan  was discussed with the patient. The patient verbalized understanding of and has agreed to the management plan.   Sallee Provencal, FNP

## 2023-05-27 ENCOUNTER — Encounter: Payer: Self-pay | Admitting: Family Medicine

## 2023-05-28 ENCOUNTER — Telehealth: Payer: Self-pay | Admitting: Family Medicine

## 2023-05-28 MED ORDER — ONDANSETRON HCL 4 MG PO TABS: 4.0000 mg | ORAL_TABLET | Freq: Every day | ORAL | 0 refills | Status: DC | PRN

## 2023-05-28 NOTE — Telephone Encounter (Signed)
 [facility] is requesting refill ondansetron (ZOFRAN) 4 MG tablet  Please advise

## 2023-05-28 NOTE — Telephone Encounter (Signed)
 Rx sent

## 2023-05-30 ENCOUNTER — Other Ambulatory Visit: Payer: Self-pay | Admitting: Family Medicine

## 2023-06-09 ENCOUNTER — Encounter: Payer: Self-pay | Admitting: Family Medicine

## 2023-06-11 ENCOUNTER — Other Ambulatory Visit: Payer: Self-pay | Admitting: Family Medicine

## 2023-06-19 ENCOUNTER — Telehealth: Payer: Self-pay

## 2023-06-19 ENCOUNTER — Ambulatory Visit: Payer: 59 | Admitting: Family Medicine

## 2023-06-19 ENCOUNTER — Telehealth: Payer: Self-pay | Admitting: Family Medicine

## 2023-06-19 ENCOUNTER — Other Ambulatory Visit: Payer: Self-pay | Admitting: Family Medicine

## 2023-06-19 DIAGNOSIS — M545 Low back pain, unspecified: Secondary | ICD-10-CM

## 2023-06-19 NOTE — Telephone Encounter (Signed)
 Copied from CRM [id number]. Topic: Clinical - Prescription Issue >> Jun 19, 2023  2:23 PM Shelah Lewandowsky wrote: Reason for CRM: oxyCODONE-acetaminophen (PERCOCET) 7.5-325 MG tablet - calling about refill- patient is completely out of medication and needs them to be able to walk without pain

## 2023-06-19 NOTE — Telephone Encounter (Addendum)
 Pt came in for appt late and states that she needs a refill on oxyCODONE-acetaminophen (PERCOCET) 7.5-325 MG tablet.  She would like it sent to [facility].  She states that she is out.  She is seeing Dr. Sherrie Mustache tomorrow.

## 2023-06-19 NOTE — Telephone Encounter (Signed)
 Duplicate encounter.  Another telephone encounter has been sent to Dr. Sherrie Mustache. Patient is scheduled to see Dr. Sherrie Mustache tomorrow.

## 2023-06-19 NOTE — Progress Notes (Deleted)
 Established patient visit   Patient: Kristin Aguirre   DOB: 30-Mar-1960   63 y.o. Female  MRN: [id number] Visit Date: 06/19/2023  Today's healthcare provider: Ronnald Ramp, MD   No chief complaint on file.  Subjective       Discussed the use of AI scribe software for clinical note transcription with the patient, who gave verbal consent to proceed.  History of Present Illness      Past Medical History:  Diagnosis Date   Acute pyelonephritis 08/13/2021   Allergy    Anxiety    Colon cancer screening 10/20/2020   COVID-19 12/29/2020   Diffuse cystic mastopathy    LEFT SIDE   GERD (gastroesophageal reflux disease)    High cholesterol    PONV (postoperative nausea and vomiting)    Sepsis (HCC) 08/13/2021    Medications: Outpatient Medications Prior to Visit  Medication Sig   ALPRAZolam (XANAX) 0.5 MG tablet Take 1 tablet (0.5 mg total) by mouth daily as needed.   aspirin EC 81 MG tablet Take 81 mg by mouth daily. Swallow whole.   citalopram (CELEXA) 40 MG tablet Take 1 tablet (40 mg total) by mouth daily.   fluticasone (FLONASE) 50 MCG/ACT nasal spray Place 2 sprays into both nostrils daily.   lisinopril (ZESTRIL) 10 MG tablet Take 1 tablet (10 mg total) by mouth daily.   nicotine (NICODERM CQ - DOSED IN MG/24 HOURS) 14 mg/24hr patch Place 1 patch (14 mg total) onto the skin daily.   ondansetron (ZOFRAN) 4 MG tablet TAKE 1 TABLET BY MOUTH ONCE DAILY AS NEEDED FOR NAUSEA AND VOMITING   rosuvastatin (CRESTOR) 10 MG tablet Take 1 tablet (10 mg total) by mouth daily.   SUMAtriptan (IMITREX) 50 MG tablet Take 1 tablet by mouth as needed for migraine. May repeat in 2 hours if headache persists or recurs. Max 2 tablets per day.   Vitamin D, Ergocalciferol, (DRISDOL) 1.25 MG (50000 UNIT) CAPS capsule Take 1 capsule (50,000 Units total) by mouth every 7 (seven) days.   No facility-administered medications prior to visit.    Review of Systems  Last CBC Lab  Results  Component Value Date   WBC 7.7 06/20/2022   HGB 10.7 (L) 06/20/2022   HCT 33.3 (L) 06/20/2022   MCV 88 06/20/2022   MCH 28.4 06/20/2022   RDW 12.7 06/20/2022   PLT 288 06/20/2022   Last metabolic panel Lab Results  Component Value Date   GLUCOSE 115 (H) 06/20/2022   NA 141 06/20/2022   K 4.7 06/20/2022   CL 102 06/20/2022   CO2 23 06/20/2022   BUN 19 06/20/2022   CREATININE 1.07 (H) 06/20/2022   EGFR 59 (L) 06/20/2022   CALCIUM 9.3 06/20/2022   PROT 7.4 06/20/2022   ALBUMIN 4.2 06/20/2022   LABGLOB 3.2 06/20/2022   AGRATIO 1.3 06/20/2022   BILITOT 0.2 06/20/2022   ALKPHOS 83 06/20/2022   AST 17 06/20/2022   ALT 14 06/20/2022   ANIONGAP 11 12/17/2021   Last lipids Lab Results  Component Value Date   CHOL 180 06/20/2022   HDL 57 06/20/2022   LDLCALC 99 06/20/2022   TRIG 134 06/20/2022   CHOLHDL 3.2 06/20/2022   Last hemoglobin A1c Lab Results  Component Value Date   HGBA1C 5.7 (H) 06/20/2022   Last thyroid functions Lab Results  Component Value Date   TSH 1.340 06/20/2022     {See past labs  Heme  Chem  Endocrine  Serology  Results  Review (optional):1}   Objective    LMP 03/18/2001 (Approximate)  BP Readings from Last 3 Encounters:  03/14/23 132/84  08/02/22 117/60  06/20/22 129/63   Wt Readings from Last 3 Encounters:  03/14/23 265 lb 8 oz (120.4 kg)  08/02/22 252 lb (114.3 kg)  06/20/22 243 lb (110.2 kg)   There is no height or weight on file to calculate BMI.    {See vitals history (optional):1}    Physical Exam  ***  No results found for any visits on 06/19/23.  Assessment & Plan     Problem List Items Addressed This Visit   None    Assessment & Plan      No follow-ups on file.         Ronnald Ramp, MD  [facility] [phone] (phone) [phone] (fax)  [facility]

## 2023-06-20 ENCOUNTER — Ambulatory Visit (INDEPENDENT_AMBULATORY_CARE_PROVIDER_SITE_OTHER): Admitting: Family Medicine

## 2023-06-20 ENCOUNTER — Encounter: Payer: Self-pay | Admitting: Family Medicine

## 2023-06-20 DIAGNOSIS — I10 Essential (primary) hypertension: Secondary | ICD-10-CM

## 2023-06-20 DIAGNOSIS — G4733 Obstructive sleep apnea (adult) (pediatric): Secondary | ICD-10-CM

## 2023-06-20 DIAGNOSIS — M170 Bilateral primary osteoarthritis of knee: Secondary | ICD-10-CM | POA: Diagnosis not present

## 2023-06-20 DIAGNOSIS — G8929 Other chronic pain: Secondary | ICD-10-CM

## 2023-06-20 DIAGNOSIS — M545 Low back pain, unspecified: Secondary | ICD-10-CM

## 2023-06-20 MED ORDER — BUPROPION HCL ER (SR) 150 MG PO TB12: ORAL_TABLET | ORAL | 1 refills | Status: AC

## 2023-06-20 MED ORDER — TOPIRAMATE 25 MG PO TABS: ORAL_TABLET | ORAL | 1 refills | Status: AC

## 2023-06-20 NOTE — Patient Instructions (Signed)
 Marland Kitchen  Please review the attached list of medications and notify my office if there are any errors.   . Please bring all of your medications to every appointment so we can make sure that our medication list is the same as yours.

## 2023-06-20 NOTE — Progress Notes (Signed)
 Established patient visit   Patient: Kristin Aguirre   DOB: 11/14/1960   63 y.o. Female  MRN: [id number] Visit Date: 06/20/2023  Today's healthcare provider: Mila Merry, MD   Chief Complaint  Patient presents with   Medical Management of Chronic Issues    Patient here to discuss weight loss and pain medication refill.   Hypertension   Subjective    Discussed the use of AI scribe software for clinical note transcription with the patient, who gave verbal consent to proceed.  History of Present Illness   Kristin Aguirre is a 63 year old female with rheumatoid arthritis who presents for a follow-up of her medications.  She experiences severe pain due to rheumatoid arthritis, primarily affecting her knees and fingers, which are swollen and cause excruciating pain. Her mobility is significantly impaired without medication. She is in the process of enrolling in a pain management clinic, having completed orientation and awaiting an appointment. She is scheduled to see a rheumatoid arthritis specialist in July.  She is currently taking pain medication more frequently than prescribed due to inadequate pain control, consuming one pill in the morning, one at lunch, and two before bed to manage her pain and help her relax.  She faces challenges with weight loss due to her inability to exercise because of knee and back pain. She has started attending the gym but can only perform seated exercises, limiting her ability to burn calories.  She recently contracted the flu, which spread through her household, including her grandchild. She experienced severe symptoms, including a 'screaming' headache, and was bedridden for several days. She is now recovering, though still weak and coughing.  She mentions a significant reduction in vaping, only taking a puff or two occasionally when anxious.       Medications: Outpatient Medications Prior to Visit  Medication Sig   ALPRAZolam (XANAX) 0.5 MG  tablet Take 1 tablet (0.5 mg total) by mouth daily as needed.   aspirin EC 81 MG tablet Take 81 mg by mouth daily. Swallow whole.   citalopram (CELEXA) 40 MG tablet Take 1 tablet (40 mg total) by mouth daily.   fluticasone (FLONASE) 50 MCG/ACT nasal spray Place 2 sprays into both nostrils daily.   lisinopril (ZESTRIL) 10 MG tablet Take 1 tablet (10 mg total) by mouth daily.   nicotine (NICODERM CQ - DOSED IN MG/24 HOURS) 14 mg/24hr patch Place 1 patch (14 mg total) onto the skin daily.   ondansetron (ZOFRAN) 4 MG tablet TAKE 1 TABLET BY MOUTH ONCE DAILY AS NEEDED FOR NAUSEA AND VOMITING   rosuvastatin (CRESTOR) 10 MG tablet Take 1 tablet (10 mg total) by mouth daily.   SUMAtriptan (IMITREX) 50 MG tablet Take 1 tablet by mouth as needed for migraine. May repeat in 2 hours if headache persists or recurs. Max 2 tablets per day.   Vitamin D, Ergocalciferol, (DRISDOL) 1.25 MG (50000 UNIT) CAPS capsule Take 1 capsule (50,000 Units total) by mouth every 7 (seven) days.   No facility-administered medications prior to visit.      Objective    BP 109/70 (BP Location: Left Arm, Patient Position: Sitting, Cuff Size: Large)   Pulse 74   Temp 98.1 F (36.7 C) (Oral)   Resp 16   Ht 5\' 9"  (1.753 m)   Wt 253 lb 11.2 oz (115.1 kg)   LMP 03/18/2001 (Approximate)   SpO2 98%   BMI 37.46 kg/m   Physical Exam   General appearance: Obese  female, cooperative and in no acute distress Head: Normocephalic, without obvious abnormality, atraumatic Respiratory: Respirations even and unlabored, normal respiratory rate Extremities: All extremities are intact.  Skin: Skin color, texture, turgor normal. No rashes seen  Psych: Appropriate mood and affect. Neurologic: Mental status: Alert, oriented to person, place, and time, thought content appropriate.    Assessment & Plan       Chronic Pain due to Rheumatoid Arthritis Chronic pain from rheumatoid arthritis severely impacts mobility, necessitating pain  management. Transition to pain management clinic planned for specialized care. Knee replacements needed due to condition limitations. - Continue current pain medication regimen until pain management appointment. - Follow up with pain management clinic for specialized care. - Refer to a rheumatologist for further evaluation and management of rheumatoid arthritis.  Obesity Weight management difficult due to pain-related exercise limitations.  GLP-1 cost prohibitive. not ideal candidate for phenermine. Considered buporion/naltrexone but naltrexone may be problematic with current oxycoone regiment. will try combo buproprion titrate up to 150 BID and topiramate titrate up to 25 mid. mg.  - Schedule follow-up in one month to assess the effectiveness of the weight loss medications and adjust further titration up to 400mg  daily buprione and 100mg  daily topiramate if necessary.  General Health Maintenance Motivated to improve health by quitting vaping and losing weight. Nearly quit vaping, using occasionally when anxious. - Encourage continued efforts to quit vaping. - Support weight loss efforts with prescribed medications and follow-up.    Return in about 1 month (around 07/20/2023) for weight loss medictions.      Mila Merry, MD  [facility] [phone] (phone) [phone] (fax)  [facility]

## 2023-06-23 ENCOUNTER — Telehealth: Payer: Self-pay

## 2023-06-23 NOTE — Telephone Encounter (Signed)
 Copied from CRM [id number]. Topic: Clinical - Prescription Issue >> Jun 20, 2023 10:45 AM Ivette P wrote: Reason for CRM: Pt calling in because she went to pharmacy, and medication oxyCODONE-acetaminophen (PERCOCET) 7.5-325 MG tablet was told it was not at the pharmacy for refill. Pt is requesting a callback for clarification [phone]

## 2023-06-23 NOTE — Telephone Encounter (Signed)
Rx has been filled 

## 2023-06-24 ENCOUNTER — Encounter: Payer: Self-pay | Admitting: Family Medicine

## 2023-06-24 NOTE — Telephone Encounter (Signed)
 Work note done

## 2023-07-14 ENCOUNTER — Other Ambulatory Visit: Payer: Self-pay | Admitting: Family Medicine

## 2023-07-14 DIAGNOSIS — G8929 Other chronic pain: Secondary | ICD-10-CM

## 2023-07-17 MED ORDER — OXYCODONE-ACETAMINOPHEN 7.5-325 MG PO TABS
1.0000 | ORAL_TABLET | Freq: Three times a day (TID) | ORAL | 0 refills | Status: AC | PRN
Start: 2023-07-17 — End: ?

## 2023-07-18 ENCOUNTER — Telehealth: Payer: Self-pay | Admitting: Family Medicine

## 2023-07-18 DIAGNOSIS — E559 Vitamin D deficiency, unspecified: Secondary | ICD-10-CM

## 2023-07-18 MED ORDER — VITAMIN D (ERGOCALCIFEROL) 1.25 MG (50000 UNIT) PO CAPS
50000.0000 [IU] | ORAL_CAPSULE | ORAL | 0 refills | Status: AC
Start: 2023-07-18 — End: ?

## 2023-07-18 NOTE — Telephone Encounter (Signed)
 [facility] is requesting refill Vitamin D , Ergocalciferol , (DRISDOL ) 1.25 MG (50000 UNIT) CAPS capsule   Please advise

## 2023-07-21 ENCOUNTER — Ambulatory Visit: Admitting: Family Medicine

## 2023-08-05 ENCOUNTER — Other Ambulatory Visit: Payer: Self-pay

## 2023-08-05 DIAGNOSIS — G8929 Other chronic pain: Secondary | ICD-10-CM

## 2023-09-02 ENCOUNTER — Telehealth: Payer: Self-pay | Admitting: Family Medicine

## 2023-09-02 DIAGNOSIS — F39 Unspecified mood [affective] disorder: Secondary | ICD-10-CM

## 2023-09-02 MED ORDER — ALPRAZOLAM 0.5 MG PO TABS: 0.5000 mg | ORAL_TABLET | Freq: Every day | ORAL | 1 refills | Status: DC | PRN

## 2023-09-02 NOTE — Telephone Encounter (Signed)
 [facility] faxed refill request for the following medications:    ALPRAZolam  (XANAX ) 0.5 MG tablet    Please advise

## 2023-09-02 NOTE — Telephone Encounter (Signed)
 Filled

## 2023-10-15 ENCOUNTER — Encounter: Payer: 59 | Admitting: Internal Medicine

## 2023-10-24 ENCOUNTER — Other Ambulatory Visit: Payer: Self-pay | Admitting: Family Medicine

## 2023-10-24 DIAGNOSIS — J324 Chronic pansinusitis: Secondary | ICD-10-CM

## 2023-12-08 ENCOUNTER — Other Ambulatory Visit: Payer: Self-pay | Admitting: Family Medicine

## 2023-12-08 DIAGNOSIS — F39 Unspecified mood [affective] disorder: Secondary | ICD-10-CM

## 2023-12-29 ENCOUNTER — Encounter: Admitting: Family Medicine

## 2024-01-08 ENCOUNTER — Encounter: Payer: Self-pay | Admitting: Family Medicine

## 2024-01-09 ENCOUNTER — Encounter: Payer: Self-pay | Admitting: Family Medicine

## 2024-01-09 ENCOUNTER — Other Ambulatory Visit: Payer: Self-pay | Admitting: Family Medicine

## 2024-01-09 DIAGNOSIS — Z1231 Encounter for screening mammogram for malignant neoplasm of breast: Secondary | ICD-10-CM

## 2024-01-12 NOTE — Telephone Encounter (Signed)
 Please call patient, let her know she needs an appointment, in person. Also recommend she call insurance company to seen what they cover for weight management.

## 2024-02-27 ENCOUNTER — Encounter: Payer: Self-pay | Admitting: Family Medicine
